# Patient Record
Sex: Female | Born: 1935
Health system: Southern US, Community
[De-identification: ages and names within clinical notes are randomized; demographics above are authoritative.]

## PROBLEM LIST (undated history)

## (undated) DIAGNOSIS — R32 Unspecified urinary incontinence: Secondary | ICD-10-CM

## (undated) DIAGNOSIS — E039 Hypothyroidism, unspecified: Secondary | ICD-10-CM

## (undated) DIAGNOSIS — K219 Gastro-esophageal reflux disease without esophagitis: Secondary | ICD-10-CM

## (undated) DIAGNOSIS — I639 Cerebral infarction, unspecified: Secondary | ICD-10-CM

## (undated) DIAGNOSIS — R296 Repeated falls: Secondary | ICD-10-CM

## (undated) DIAGNOSIS — E7800 Pure hypercholesterolemia, unspecified: Secondary | ICD-10-CM

## (undated) DIAGNOSIS — F32A Depression, unspecified: Secondary | ICD-10-CM

## (undated) DIAGNOSIS — F329 Major depressive disorder, single episode, unspecified: Secondary | ICD-10-CM

## (undated) HISTORY — PX: CATARACT EXTRACTION: SUR2

## (undated) HISTORY — DX: Cerebral infarction, unspecified: I63.9

## (undated) HISTORY — PX: CARPAL TUNNEL RELEASE: SHX101

## (undated) HISTORY — DX: Hypothyroidism, unspecified: E03.9

---

## 1999-11-30 ENCOUNTER — Other Ambulatory Visit: Admission: RE | Admit: 1999-11-30 | Discharge: 1999-11-30 | Payer: Self-pay | Admitting: Obstetrics and Gynecology

## 2000-03-24 ENCOUNTER — Encounter: Admission: RE | Admit: 2000-03-24 | Discharge: 2000-03-24 | Payer: Self-pay | Admitting: Obstetrics and Gynecology

## 2000-03-24 ENCOUNTER — Encounter: Payer: Self-pay | Admitting: Obstetrics and Gynecology

## 2002-04-21 ENCOUNTER — Other Ambulatory Visit: Admission: RE | Admit: 2002-04-21 | Discharge: 2002-04-21 | Payer: Self-pay | Admitting: Obstetrics and Gynecology

## 2003-03-03 ENCOUNTER — Other Ambulatory Visit: Admission: RE | Admit: 2003-03-03 | Discharge: 2003-03-03 | Payer: Self-pay | Admitting: Internal Medicine

## 2004-10-10 ENCOUNTER — Ambulatory Visit (HOSPITAL_COMMUNITY): Admission: RE | Admit: 2004-10-10 | Discharge: 2004-10-10 | Payer: Self-pay | Admitting: *Deleted

## 2005-11-08 ENCOUNTER — Other Ambulatory Visit: Admission: RE | Admit: 2005-11-08 | Discharge: 2005-11-08 | Payer: Self-pay | Admitting: Internal Medicine

## 2005-12-09 ENCOUNTER — Ambulatory Visit: Admission: RE | Admit: 2005-12-09 | Discharge: 2005-12-09 | Payer: Self-pay | Admitting: Internal Medicine

## 2007-05-01 ENCOUNTER — Other Ambulatory Visit: Admission: RE | Admit: 2007-05-01 | Discharge: 2007-05-01 | Payer: Self-pay | Admitting: Internal Medicine

## 2008-07-29 ENCOUNTER — Other Ambulatory Visit: Admission: RE | Admit: 2008-07-29 | Discharge: 2008-07-29 | Payer: Self-pay | Admitting: Internal Medicine

## 2009-03-19 ENCOUNTER — Inpatient Hospital Stay (HOSPITAL_COMMUNITY): Admission: EM | Admit: 2009-03-19 | Discharge: 2009-03-23 | Payer: Self-pay | Admitting: Emergency Medicine

## 2009-09-04 ENCOUNTER — Other Ambulatory Visit: Admission: RE | Admit: 2009-09-04 | Discharge: 2009-09-04 | Payer: Self-pay | Admitting: Internal Medicine

## 2011-01-14 LAB — CLOSTRIDIUM DIFFICILE EIA
C difficile Toxins A+B, EIA: NEGATIVE
C difficile Toxins A+B, EIA: NEGATIVE

## 2011-01-14 LAB — FECAL LACTOFERRIN, QUANT: Fecal Lactoferrin: POSITIVE

## 2011-01-14 LAB — UIFE/LIGHT CHAINS/TP QN, 24-HR UR
Albumin, U: DETECTED
Alpha 1, Urine: DETECTED — AB
Alpha 2, Urine: DETECTED — AB
Beta, Urine: DETECTED — AB
Free Kappa Lt Chains,Ur: 50.9 mg/dL — ABNORMAL HIGH (ref 0.04–1.51)
Free Kappa/Lambda Ratio: 12.06 ratio — ABNORMAL HIGH (ref 0.46–4.00)
Free Lambda Excretion/Day: 30.6 mg/d
Free Lambda Lt Chains,Ur: 4.22 mg/dL — ABNORMAL HIGH (ref 0.08–1.01)
Free Lt Chn Excr Rate: 369.03 mg/d
Gamma Globulin, Urine: DETECTED — AB
Time: 24 hours
Total Protein, Urine-Ur/day: 408 mg/d — ABNORMAL HIGH (ref 10–140)
Total Protein, Urine: 56.3 mg/dL
Volume, Urine: 725 mL

## 2011-01-14 LAB — COMPREHENSIVE METABOLIC PANEL
ALT: 11 U/L (ref 0–35)
ALT: 12 U/L (ref 0–35)
AST: 13 U/L (ref 0–37)
AST: 17 U/L (ref 0–37)
Albumin: 2 g/dL — ABNORMAL LOW (ref 3.5–5.2)
Albumin: 2.2 g/dL — ABNORMAL LOW (ref 3.5–5.2)
Alkaline Phosphatase: 92 U/L (ref 39–117)
Alkaline Phosphatase: 93 U/L (ref 39–117)
BUN: 13 mg/dL (ref 6–23)
BUN: 14 mg/dL (ref 6–23)
CO2: 23 mEq/L (ref 19–32)
CO2: 25 mEq/L (ref 19–32)
Calcium: 8.3 mg/dL — ABNORMAL LOW (ref 8.4–10.5)
Calcium: 8.6 mg/dL (ref 8.4–10.5)
Chloride: 106 mEq/L (ref 96–112)
Chloride: 108 mEq/L (ref 96–112)
Creatinine, Ser: 0.78 mg/dL (ref 0.4–1.2)
Creatinine, Ser: 0.84 mg/dL (ref 0.4–1.2)
GFR calc Af Amer: >60 mL/min (ref >60)
GFR calc Af Amer: >60 mL/min (ref >60)
GFR calc non Af Amer: >60 mL/min (ref >60)
GFR calc non Af Amer: >60 mL/min (ref >60)
Glucose, Bld: 110 mg/dL — ABNORMAL HIGH (ref 70–99)
Glucose, Bld: 122 mg/dL — ABNORMAL HIGH (ref 70–99)
Potassium: 3.7 mEq/L (ref 3.5–5.1)
Potassium: 4.2 mEq/L (ref 3.5–5.1)
Sodium: 137 mEq/L (ref 135–145)
Sodium: 138 mEq/L (ref 135–145)
Total Bilirubin: 0.5 mg/dL (ref 0.3–1.2)
Total Bilirubin: 0.7 mg/dL (ref 0.3–1.2)
Total Protein: 5.6 g/dL — ABNORMAL LOW (ref 6.0–8.3)
Total Protein: 5.8 g/dL — ABNORMAL LOW (ref 6.0–8.3)

## 2011-01-14 LAB — CBC
HCT: 28.2 % — ABNORMAL LOW (ref 36.0–46.0)
HCT: 31.8 % — ABNORMAL LOW (ref 36.0–46.0)
HCT: 29.4 % — ABNORMAL LOW (ref 36.0–46.0)
HCT: 29.5 % — ABNORMAL LOW (ref 36.0–46.0)
HCT: 31.8 % — ABNORMAL LOW (ref 36.0–46.0)
Hemoglobin: 10.6 g/dL — ABNORMAL LOW (ref 12.0–15.0)
Hemoglobin: 9.5 g/dL — ABNORMAL LOW (ref 12.0–15.0)
Hemoglobin: 10.5 g/dL — ABNORMAL LOW (ref 12.0–15.0)
Hemoglobin: 9.7 g/dL — ABNORMAL LOW (ref 12.0–15.0)
Hemoglobin: 9.8 g/dL — ABNORMAL LOW (ref 12.0–15.0)
MCHC: 33.3 g/dL (ref 30.0–36.0)
MCHC: 33.9 g/dL (ref 30.0–36.0)
MCHC: 32.9 g/dL (ref 30.0–36.0)
MCHC: 33 g/dL (ref 30.0–36.0)
MCHC: 33.4 g/dL (ref 30.0–36.0)
MCV: 89.3 fL (ref 78.0–100.0)
MCV: 89.7 fL (ref 78.0–100.0)
MCV: 90.6 fL (ref 78.0–100.0)
MCV: 90.7 fL (ref 78.0–100.0)
MCV: 91 fL (ref 78.0–100.0)
Platelets: 425 10*3/uL — ABNORMAL HIGH (ref 150–400)
Platelets: 477 10*3/uL — ABNORMAL HIGH (ref 150–400)
Platelets: 356 10*3/uL (ref 150–400)
Platelets: 376 10*3/uL (ref 150–400)
Platelets: 395 10*3/uL (ref 150–400)
RBC: 3.15 MIL/uL — ABNORMAL LOW (ref 3.87–5.11)
RBC: 3.55 MIL/uL — ABNORMAL LOW (ref 3.87–5.11)
RBC: 3.24 MIL/uL — ABNORMAL LOW (ref 3.87–5.11)
RBC: 3.24 MIL/uL — ABNORMAL LOW (ref 3.87–5.11)
RBC: 3.51 MIL/uL — ABNORMAL LOW (ref 3.87–5.11)
RDW: 15.4 % (ref 11.5–15.5)
RDW: 15.8 % — ABNORMAL HIGH (ref 11.5–15.5)
RDW: 13.9 % (ref 11.5–15.5)
RDW: 14.4 % (ref 11.5–15.5)
RDW: 15.1 % (ref 11.5–15.5)
WBC: 9.6 10*3/uL (ref 4.0–10.5)
WBC: 9.9 10*3/uL (ref 4.0–10.5)
WBC: 13.9 10*3/uL — ABNORMAL HIGH (ref 4.0–10.5)
WBC: 15.1 10*3/uL — ABNORMAL HIGH (ref 4.0–10.5)
WBC: 17.6 10*3/uL — ABNORMAL HIGH (ref 4.0–10.5)

## 2011-01-14 LAB — BASIC METABOLIC PANEL
BUN: 4 mg/dL — ABNORMAL LOW (ref 6–23)
CO2: 25 mEq/L (ref 19–32)
Calcium: 7.9 mg/dL — ABNORMAL LOW (ref 8.4–10.5)
Chloride: 107 mEq/L (ref 96–112)
Creatinine, Ser: 0.69 mg/dL (ref 0.4–1.2)
GFR calc Af Amer: >60 mL/min (ref >60)
GFR calc non Af Amer: >60 mL/min (ref >60)
Glucose, Bld: 125 mg/dL — ABNORMAL HIGH (ref 70–99)
Potassium: 3 mEq/L — ABNORMAL LOW (ref 3.5–5.1)
Sodium: 136 mEq/L (ref 135–145)

## 2011-01-14 LAB — URINALYSIS, ROUTINE W REFLEX MICROSCOPIC
Bilirubin Urine: NEGATIVE
Glucose, UA: NEGATIVE mg/dL
Hgb urine dipstick: NEGATIVE
Ketones, ur: 15 mg/dL — AB
Nitrite: POSITIVE — AB
Protein, ur: 30 mg/dL — AB
Specific Gravity, Urine: 1.042 — ABNORMAL HIGH (ref 1.005–1.030)
Urobilinogen, UA: 1 mg/dL (ref 0.0–1.0)
pH: 5.5 (ref 5.0–8.0)

## 2011-01-14 LAB — VITAMIN B12: Vitamin B-12: 1371 pg/mL — ABNORMAL HIGH (ref 211–911)

## 2011-01-14 LAB — 5 HIAA, QUANTITATIVE, URINE, 24 HOUR
5-HIAA, 24 Hr Urine: 1 mg/24 h (ref <=6.0)
Volume, Urine-5HIAA: 725 mL/24 h

## 2011-01-14 LAB — PROTEIN ELECTROPH W RFLX QUANT IMMUNOGLOBULINS
Albumin ELP: 40.6 % — ABNORMAL LOW (ref 55.8–66.1)
Alpha-1-Globulin: 11.7 % — ABNORMAL HIGH (ref 2.9–4.9)
Alpha-2-Globulin: 15.2 % — ABNORMAL HIGH (ref 7.1–11.8)
Beta 2: 9.4 % — ABNORMAL HIGH (ref 3.2–6.5)
Beta Globulin: 5.9 % (ref 4.7–7.2)
Gamma Globulin: 17.2 % (ref 11.1–18.8)
M-Spike, %: NOT DETECTED g/dL
Total Protein ELP: 5.4 g/dL — ABNORMAL LOW (ref 6.0–8.3)

## 2011-01-14 LAB — FOLATE RBC: RBC Folate: 1167 ng/mL — ABNORMAL HIGH (ref 180–600)

## 2011-01-14 LAB — DIFFERENTIAL
Basophils Absolute: 0.2 10*3/uL — ABNORMAL HIGH (ref 0.0–0.1)
Basophils Relative: 1 % (ref 0–1)
Eosinophils Absolute: 0 10*3/uL (ref 0.0–0.7)
Eosinophils Relative: 0 % (ref 0–5)
Lymphocytes Relative: 5 % — ABNORMAL LOW (ref 12–46)
Lymphs Abs: 0.9 10*3/uL (ref 0.7–4.0)
Monocytes Absolute: 0.9 10*3/uL (ref 0.1–1.0)
Monocytes Relative: 5 % (ref 3–12)
Neutro Abs: 15.6 10*3/uL — ABNORMAL HIGH (ref 1.7–7.7)
Neutrophils Relative %: 89 % — ABNORMAL HIGH (ref 43–77)
WBC Morphology: INCREASED

## 2011-01-14 LAB — OVA AND PARASITE EXAMINATION

## 2011-01-14 LAB — URINE MICROSCOPIC-ADD ON

## 2011-01-14 LAB — IGG, IGA, IGM
IgA: 363 mg/dL (ref 68–378)
IgG (Immunoglobin G), Serum: 998 mg/dL (ref 694–1618)
IgM, Serum: 97 mg/dL (ref 60–263)

## 2011-01-14 LAB — IRON AND TIBC
Iron: <10 ug/dL — ABNORMAL LOW (ref 42–135)
UIBC: 92 ug/dL

## 2011-01-14 LAB — TSH: TSH: 0.447 u[IU]/mL (ref 0.350–4.500)

## 2011-01-14 LAB — STOOL CULTURE

## 2011-01-14 LAB — IMMUNOFIXATION ADD-ON

## 2011-01-14 LAB — LACTATE DEHYDROGENASE: LDH: 70 U/L — ABNORMAL LOW (ref 94–250)

## 2011-01-14 LAB — FERRITIN: Ferritin: 419 ng/mL — ABNORMAL HIGH (ref 10–291)

## 2011-01-14 LAB — T4, FREE: Free T4: 0.96 ng/dL (ref 0.80–1.80)

## 2011-01-14 LAB — HAPTOGLOBIN: Haptoglobin: 272 mg/dL — ABNORMAL HIGH (ref 16–200)

## 2011-01-14 LAB — GASTRIN: Gastrin: 74 pg/mL (ref 13–115)

## 2011-02-19 NOTE — Discharge Summary (Signed)
Patricia [name], Patricia [name]                ACCOUNT NO.:  [id number]   MEDICAL RECORD NO.:  [id number]          PATIENT TYPE:  INP   LOCATION:  1539                         FACILITY:  [facility]   PHYSICIAN:  Isidor Holts, M.D.  DATE OF BIRTH:  23-Oct-1929   DATE OF ADMISSION:  03/19/2009  DATE OF DISCHARGE:  03/23/2009                               DISCHARGE SUMMARY   PRIMARY CARE PHYSICIAN:  Soyla Murphy. Renne Crigler, M.D.   PRIMARY GASTROENTEROLOGIST:  Georgiana Spinner, M.D.   DISCHARGE DIAGNOSES:  1. Clostridium difficile enterocolitis.  2. Gastroesophageal reflux disease.  3. History of diverticulosis.  4. Iron deficiency anemia.  5. Dysthyroidism.  6. History of TMJ (temporomandibular joint) arthritis.   DISCHARGE MEDICATIONS:  1. Synthroid 88 mcg p.o. daily.  2. Elavil 10 mg p.o. q.h.s.  3. Flagyl 500 mg p.o. t.i.d. with food for 14 days.  4. K-Dur 20 mEq p.o. daily for 3 days only.   PROCEDURES:  Chest x-ray done March 19, 2009, this showed no active  disease.   CONSULTATIONS:  Georgiana Spinner, M.D., gastroenterologist.   ADMISSION HISTORY:  As in H and P notes of March 19, 2009, dictated by  Dr. Massie Maroon.  However, in brief this is a 77 year old female, with  known history of GERD, sigmoid diverticulosis confirmed colonoscopically  on October 10, 2004, internal hemorrhoids, TMJ arthritis, presenting with  watery diarrhea associated with lower abdominal cramping.  Reportedly  her symptoms started approximately 3 weeks ago, and were not associated  with nausea, vomiting, constipation or passage of blood per rectum.  She  had been scheduled to have a colonoscopy on June 14. 2010, by Dr. Sabino Gasser, gastroenterologist.  However, because of escalating symptoms, she  presented to the emergency department, where she was found to have a  white cell count of 17,600, hemoglobin 10.5.  She was therefore admitted  for further evaluation, investigation and management.   CLINICAL COURSE:  1. C.  difficile enterocolitis. For details of presentation, refer to      admission history above.  On detailed questioning, it appears that      the patient has in the recent past, been treated with a course of      antibiotics, i.e. amoxicillin.  Following admission, she was      commenced on bowel rest, intravenous fluid hydration, and was      started empirically on a combination of Ciprofloxacin and Flagyl.      Stool samples sent for C. difficile toxin, came back positive.  She      was therefore, transitioned to monotherapy with Flagyl and by March 23, 2009, the diarrhea had completely resolved and the patient was      on day #5 of Flagyl therapy.  She has been seen by Dr. Sabino Gasser,      gastroenterologist, who has opined that colonoscopy was not      indicated at the present time, and has recommended a further 2      weeks of Flagyl therapy, following discharge.  The patient will  follow up with him in his office, in due course.  We were pleased      to note that as of March 23, 2009, white cell count had normalized      to 9,900.  The patient had no febrile episodes during the course of      this hospitalization.   1. GERD.  The patient remained asymptomatic from this viewpoint.   1. Iron deficiency anemia.  As noted above, the patient at the time of      presentation, had a hemoglobin of 10.5, MCV was normocytic at 90.6.      Hematinic studies were done, and showed the following findings:      Iron less than 10, total iron binding capacity and percent      desaturation not calculated, vitamin B12 was normal at 1371, RBC      folate was normal at 1167, ferritin was elevated at 419.  It was      felt that this may be due to acute phase reaction.  She did undergo      a myeloma screen, which showed no evidence of an M spike, however,      urinary free kappa light chains were noted.  The clinical      significance of this finding is uncertain.  Likely, the patient's      iron  deficiency anemia is secondary to known history of      diverticulosis.  She has informed us that she is due for routine      screening colonoscopy sometime in 2011.  We shall defer the timing      of this, to the patient's primary gastroenterologist, Dr. Sabino Gasser.  Meanwhile, a normocytic anemia which is likely dimorphic      against a background of positive urine light chains and absence of      M spike on SPEP, raises the specter of possible monoclonal      gammopathy of uncertain significance.  We shall defer further      evaluation of this, to the patient's primary MD, Dr. Merri Brunette.   1. Dysthyroidism.  The patient has a known history of hypothyroidism.      TSH during this hospitalization, was normal at 0.447.  She has been      continued on pre-admission dosage of Synthroid.   1. TMJ arthritis.  This appears to be a recent diagnosis and the      patient has indeed as a matter of fact, already been evaluated by a      Designer, industrial/product.  We shall defer management to him.   DISPOSITION:  The patient did experience a mild conjuntivitis during the  initial few days of hospitalization, which responded readily to  Gentamicin eyedrops, and was on March 23, 2009, asymptomatic and appeared  clinically stable for discharge.  She did vomit once following breakfast  in the a.m. of March 23, 2009, but thereafter, remained asymptomatic.   DIET:  Regular/pureed consistency, in view of TMJ arthritis.   ACTIVITY:  As tolerated.   FOLLOWUP INSTRUCTIONS:  The patient is to follow up with her primary MD,  Dr. Merri Brunette, per prior scheduled appointment.  Also, she is to  follow up with her primary gastroenterologist, Dr. Sabino Gasser, in  approximately 2 weeks.  She has been instructed to call for an  appointment.   SPECIAL INSTRUCTIONS:  Amoxicillin has been discontinued.      Cristal Deer  Brien Few, M.D.  Electronically Signed     CO/MEDQ  D:  03/23/2009  T:  03/23/2009  Job:   [id number]   cc:   Soyla Murphy. Renne Crigler, M.D.  Fax: [phone]   Georgiana Spinner, M.D.  Fax: [phone]

## 2011-02-19 NOTE — H&P (Signed)
Patricia [name], Patricia [name] NO.:  [id number]   MEDICAL RECORD NO.:  [id number]          PATIENT TYPE:  EMS   LOCATION:  ED                           FACILITY:  [facility]   PHYSICIAN:  Massie Maroon, MD        DATE OF BIRTH:  September 07, 1930   DATE OF ADMISSION:  03/19/2009  DATE OF DISCHARGE:                              HISTORY & PHYSICAL   CHIEF COMPLAINT:  Diarrhea.   HISTORY OF PRESENT ILLNESS:  A 77 year old female with complaints of  chronic diarrhea.  She noted increasing fatigue as well as increased  diarrhea, which she described as watery and so came to the ER.  She  notes that 3 weeks ago she had 3 days of lower abdominal cramping.  She  does not have any abdominal pain at this time.  The patient denies any  nausea, vomiting, constipation, bright red blood per rectum, or black  stool.  She noted that she was scheduled for a colonoscopy tomorrow and  this is for evaluation of diarrhea.  She is supposed to have this  colonoscopy with Dr. Sabino Gasser.  The patient was noted to be afebrile  and borderline tachycardic in the ER with a pulse of 97 as well as a  blood pressure of 105/58.  Her white count was noted to be elevated at  17.6, and she was noted to be anemic with a hemoglobin of 10.5.  Her  liver function was within normal limits.  Her renal function was also  within normal limits.  The patient will be admitted for workup of  diarrhea as well as some dysphasia, which she as described over the past  10 days.  She notes that the dysphagia is both to liquids and solids.  She notes that she has a history of GERD, but this is not increased.  The patient is not presently on any medications for acid reflux.   PAST MEDICAL HISTORY:  1. Diarrhea.  2. GERD.  3. Sigmoid diverticulosis present on colonoscopy, October 10, 2004,      which showed internal hemorrhoids as well.  There is no history of      anemia.   PAST SURGICAL HISTORY:  None.   SOCIAL HISTORY:  Patient  does not smoke or drink.   FAMILY HISTORY:  None.   REVIEW OF SYSTEMS:  Positive for neck pain x1 week as well as some jaw  soreness, which she states was said to be due to TMJ.  She was started  on Elavil for some grinding of her teeth at 10 mg p.o. q.h.s.  She also  notes just generalized muscle aches.  Otherwise, review of systems is  negative for all 10 organ systems except for pertinent positives stated  above.   ALLERGIES:  No known drug allergies.   MEDICATIONS:  Synthroid, amoxicillin, Elavil.   PHYSICAL EXAMINATION:  VITAL SIGNS:  Temperature 99, pulse 97, blood  pressure 105/58, pulse-ox 96% on room air.  HEENT:  Anicteric, EOMI, no nystagmus, pupils 1.5 mm, symmetric, direct,  consensual, near reflexes intact, mucous membranes moist.  NECK:  No JVD, no bruit, no thyromegaly, no adenopathy.  HEART:  Regular rate and rhythm, S1 S2, no murmurs, gallops, or rubs.  LUNGS:  Clear to auscultation bilaterally.  ABDOMEN:  Soft, nontender, and nondistended, positive bowel sounds.  EXTREMITIES:  No cyanosis, clubbing, or edema.  SKIN:  No rashes.  LYMPH NODES.  No adenopathy.  NEURO EXAM:  Cranial nerves II-XII intact, reflexes 2+, symmetric,  diffuse with downgoing toes bilaterally, motor strength 5/5 in all 4  extremities, pinprick intact.   LABORATORY DATA:  WBC 17.6 (high), hemoglobin 10.5, platelet count 376.   Sodium 137, potassium 4.2, chloride 106, bicarb 25, BUN 14, creatinine  0.84, glucose 122.  AST 17, ALT 12, albumin 2.2, total protein 5.8  (low).  Urinalysis was negative except for an elevated specific gravity  of 1.042.   ASSESSMENT:  1. Diarrhea.  2. Dysphagia.  3. Anemia.  4. Hypothyroidism.   PLAN:  1. Diarrhea possibly secondary to her Synthroid being too strong a      dose:  We will check a TSH.  Diarrhea could also be possibly      secondary to sigmoid diverticulitis versus diverticulosis.  We will      also check a urine for 5-HIAA as well as a  gastrin level.  We will      check stool for fecal leukocytes, stool cultures, C. diff, ova and      parasites.  We will consult GI, Dr. Virginia Rochester, for an EGD plus      colonoscopy.  We will treat the diarrhea with Flagyl 500 mg IV q.8      while we are waiting for results.  2. Dysphagia:  The patient will be started on Protonix 40 mg IV b.i.d.      and we will have further evaluation by Dr. Sabino Gasser tomorrow.  We      will make the patient NPO except for some ice chips.  3. Anemia:  We will check iron studies, TIBC, ferritin, B12, folic      acid, TSH, LDH, haptoglobin, as well as an SPEP and a UPEP.  4. DVT prophylaxis:  We will use SCDs and TEDS.      Massie Maroon, MD  Electronically Signed     JYK/MEDQ  D:  03/19/2009  T:  03/19/2009  Job:  [id number]   cc:   Soyla Murphy. Renne Crigler, M.D.  Fax: [phone]   Georgiana Spinner, M.D.  Fax: [phone]   Vangie Bicker, P.A.

## 2011-02-22 NOTE — Op Note (Signed)
Patricia [name], Patricia [name]                ACCOUNT NO.:  [id number]   MEDICAL RECORD NO.:  [id number]          PATIENT TYPE:  AMB   LOCATION:  ENDO                         FACILITY:  [facility]   PHYSICIAN:  Georgiana Spinner, M.D.    DATE OF BIRTH:  09-15-1930   DATE OF PROCEDURE:  10/10/2004  DATE OF DISCHARGE:                                 OPERATIVE REPORT   PROCEDURE:  Colonoscopy.   INDICATIONS:  Colon cancer screening.   ANESTHESIA:  Demerol 70, Versed 7 mg.   PROCEDURE:  With the patient mildly sedated in the left lateral decubitus  position, the Olympus videoscopic colonoscope was inserted in the rectum,  passed under direct vision to the cecum identified by ileocecal valve and  base of cecum, both which were photographed. From this point the colonoscope  was slowly withdrawn, taking circumferential views of colonic mucosa,  stopping in the rectum which appeared normal on direct, showed hemorrhoids  on retroflexed view. The endoscope was straightened and withdrawn. The  patient's vital signs and pulse oximeter remained stable. The patient  tolerated procedure well without apparent complications.   FINDINGS:  Internal hemorrhoids, some diverticula of the sigmoid colon,  diffuse melanosis coli.   PLAN:  Advised decreasing the use of laxatives and follow-up will be with me  as needed.      GMO/MEDQ  D:  10/10/2004  T:  10/10/2004  Job:  [id number]

## 2015-01-16 ENCOUNTER — Other Ambulatory Visit: Payer: Self-pay | Admitting: Internal Medicine

## 2015-01-16 DIAGNOSIS — G459 Transient cerebral ischemic attack, unspecified: Secondary | ICD-10-CM

## 2015-01-23 ENCOUNTER — Ambulatory Visit
Admission: RE | Admit: 2015-01-23 | Discharge: 2015-01-23 | Disposition: A | Discharge status: Home / Self Care | Payer: Medicare Other | Source: Ambulatory Visit | Attending: Internal Medicine | Admitting: Internal Medicine

## 2015-01-23 DIAGNOSIS — G459 Transient cerebral ischemic attack, unspecified: Secondary | ICD-10-CM

## 2015-01-26 ENCOUNTER — Encounter: Payer: Self-pay | Admitting: Neurology

## 2015-01-26 ENCOUNTER — Ambulatory Visit (INDEPENDENT_AMBULATORY_CARE_PROVIDER_SITE_OTHER): Payer: Medicare Other | Admitting: Neurology

## 2015-01-26 VITALS — BP 126/71 | HR 87 | Temp 97.0°F | Ht 67.0 in | Wt 156.0 lb

## 2015-01-26 DIAGNOSIS — R410 Disorientation, unspecified: Secondary | ICD-10-CM

## 2015-01-26 DIAGNOSIS — G9340 Encephalopathy, unspecified: Secondary | ICD-10-CM | POA: Diagnosis not present

## 2015-01-26 DIAGNOSIS — H53139 Sudden visual loss, unspecified eye: Secondary | ICD-10-CM | POA: Diagnosis not present

## 2015-01-26 DIAGNOSIS — I639 Cerebral infarction, unspecified: Secondary | ICD-10-CM

## 2015-01-26 DIAGNOSIS — R4182 Altered mental status, unspecified: Secondary | ICD-10-CM | POA: Insufficient documentation

## 2015-01-26 DIAGNOSIS — H53131 Sudden visual loss, right eye: Secondary | ICD-10-CM

## 2015-01-26 NOTE — Progress Notes (Signed)
[facility]    Provider:  Dr Jaynee Eagles Referring Provider: Deland Pretty, MD Primary Care Physician:  Horatio Pel, MD  CC:  One episode of not remembering  HPI:  Patricia [name] is a 81 y.o. female here as a referral from Dr. Shelia Media for episode of altered awareness. PMHx hypothyroidism, mild tremor.   She was at home on March 25th, had a headache on the left frontal area, was tring to change the channel, she couldn't get it to change. She felt confused, doesn't have an app to the change the channel. The controls were right in front of her. She lost consciousness from then until midnight. She regained consciousness in the same place, shocked it was midnight. Tried to use cell phone again, still confused at that time. Didn't know how to get to her bedroom, couldn't get the TV off. She turned the power button off and went to bed. Vision is blurry since the episode, right prisms, muted colors for several days afterwards. No fhx seizure, or migraines.  No recents infections but did hit her ankle on March 4th a hitch which was swollen. There was swelling and bruising but no cellulitis. Here with daughter who has never noticed anything unusual.  No urinary symptoms. No other episodes of staring, strange behavior, altered awareness, loss of urine, biting of tongue, finding herself on floor or in strange places, getting lost in the car. Doesn't believe she fell asleep. She has a lot of stress, her husband had a stroke. She is main caretaker.   Review of Systems: Patient complains of symptoms per HPI as well as the following symptoms: No CP, no SOB. Pertinent negatives per HPI. All others negative.   History   Social History  . Marital Status: Married    Spouse Name: [name]   . Number of Children: 4  . Years of Education: 16+   Occupational History  . Not on file.   Social History Main Topics  . Smoking status: Never Smoker   . Smokeless tobacco: Not on file  . Alcohol Use:  0.0 oz/week    0 Standard drinks or equivalent per week     Comment: Very rarley   . Drug Use: No  . Sexual Activity: Not on file   Other Topics Concern  . 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 Heart disease Mother   . Lupus Daughter   . Anuerysm Daughter     Brain    History reviewed. No pertinent past medical history.  Past Surgical History  Procedure Laterality Date  . Cataract extraction Bilateral     Current Outpatient Prescriptions  Medication Sig Dispense Refill  . aspirin 81 MG chewable tablet Chew 81 mg by mouth daily.    . Biotin 5000 MCG TABS Take 1 tablet by mouth daily.    Marland Kitchen CALCIUM PO Take 1,000 mg by mouth daily.    . Cholecalciferol (VITAMIN D-3 PO) Take 1,000 mg by mouth daily.    . DiphenhydrAMINE HCl (BENADRYL PO) Take 1 tablet by mouth every evening.    . Flaxseed, Linseed, (FLAX SEED OIL PO) Take 1,000 mg by mouth daily.    Marland Kitchen GLUCOSAMINE-CHONDROITIN PO Take 1,500 mg by mouth daily.    Marland Kitchen levothyroxine (SYNTHROID, LEVOTHROID) 88 MCG tablet Take 88 mcg  by mouth daily before breakfast.    . MELATONIN PO Take 5 mg by mouth daily.    . Misc Natural Products (COLON CLEANSE PO) Take 450 mg by mouth daily.    . Multiple Vitamin (MULTIVITAMIN) tablet Take 1 tablet by mouth daily.    . Omega-3 Fatty Acids (FISH OIL PO) Take 1,200 mg by mouth daily.    Marland Kitchen POTASSIUM PO Take 550 mg by mouth daily.    . Probiotic Product (PROBIOTIC DAILY PO) Take 1 tablet by mouth daily.    . TURMERIC PO Take 750 mg by mouth daily.    . vitamin B-12 (CYANOCOBALAMIN) 1000 MCG tablet Take 1,000 mcg by mouth daily.    . vitamin C (ASCORBIC ACID) 500 MG tablet Take 500 mg by mouth daily.     No current facility-administered medications for this visit.    Allergies as of 01/26/2015  . (No Known Allergies)    Vitals: BP 126/71  mmHg  Pulse 87  Temp(Src) 97 F (36.1 C)  Ht 5\' 7"  (1.702 m)  Wt 156 lb (70.761 kg)  BMI 24.43 kg/m2 Last Weight:  Wt Readings from Last 1 Encounters:  01/26/15 156 lb (70.761 kg)   Last Height:   Ht Readings from Last 1 Encounters:  01/26/15 5\' 7"  (1.702 m)    Physical exam: Exam: Gen: NAD, conversant, well nourised, well groomed                     CV: RRR, no MRG. No Carotid Bruits. No peripheral edema, warm, nontender Eyes: Conjunctivae clear without exudates or hemorrhage  Neuro: Detailed Neurologic Exam  Speech:    Speech is normal; fluent and spontaneous with normal comprehension.  Cognition:    The patient is oriented to person, place, and time;     recent and remote memory intact;     language fluent;     normal attention, concentration,     fund of knowledge Cranial Nerves:    The pupils are equal, round, and reactive to light. The fundi are normal and spontaneous venous pulsations are present. Visual fields are full to finger confrontation. Extraocular movements are intact. Trigeminal sensation is intact and the muscles of mastication are normal. The face is symmetric. The palate elevates in the midline. Hearing intact. Voice is normal. Shoulder shrug is normal. The tongue has normal motion without fasciculations.   Coordination:    Normal finger to nose and heel to shin. Normal rapid alternating movements.   Gait:    Heel-toe and tandem gait are normal.   Motor Observation:  mild postural tremor Tone:    Normal muscle tone.    Posture:    Posture is normal. normal erect    Strength:    Weakness proximal uppers (shoulder pain) Weakness proximal hip flexors.      Sensation: intact to LT     Reflex Exam:  DTR's:    Absent achilles Toes:    The toes are downgoing bilaterally.   Clonus:    Clonus is absent.      Assessment/Plan:  81 year old female with one episode of altered awareness or possible loss of consciousness.  EEG labs MRI of  the brain w/wo contrast Follow up in 4 weeks  Sarina Ill, MD  [facility] [address] [place], [place] [address]  Phone [phone] Fax [phone]

## 2015-01-26 NOTE — Patient Instructions (Signed)
Overall you are doing fairly well but I do want to suggest a few things today:   Remember to drink plenty of fluid, eat healthy meals and do not skip any meals. Try to eat protein with a every meal and eat a healthy snack such as fruit or nuts in between meals. Try to keep a regular sleep-wake schedule and try to exercise daily, particularly in the form of walking, 20-30 minutes a day, if you can.   As far as your medications are concerned, I would like to suggest: continue current medications   As far as diagnostic testing: MRi of the brain, EEG  I would like to see you back after workup is complete, sooner if we need to. Please call us with any interim questions, concerns, problems, updates or refill requests.   Please also call us for any test results so we can go over those with you on the phone.  My clinical assistant and will answer any of your questions and relay your messages to me and also relay most of my messages to you.   Our phone number is [phone]. We also have an after hours call service for urgent matters and there is a physician on-call for urgent questions. For any emergencies you know to call 911 or go to the nearest emergency room

## 2015-01-27 ENCOUNTER — Telehealth: Payer: Self-pay | Admitting: *Deleted

## 2015-01-27 LAB — COMPREHENSIVE METABOLIC PANEL
ALT: 10 IU/L (ref 0–32)
AST: 14 IU/L (ref 0–40)
Albumin/Globulin Ratio: 1.5 (ref 1.1–2.5)
Albumin: 4.1 g/dL (ref 3.5–4.7)
Alkaline Phosphatase: 47 IU/L (ref 39–117)
BUN/Creatinine Ratio: 22 (ref 11–26)
BUN: 19 mg/dL (ref 8–27)
Bilirubin Total: 0.4 mg/dL (ref 0.0–1.2)
CO2: 25 mmol/L (ref 18–29)
Calcium: 9.8 mg/dL (ref 8.7–10.3)
Chloride: 100 mmol/L (ref 97–108)
Creatinine, Ser: 0.87 mg/dL (ref 0.57–1.00)
GFR calc Af Amer: 71 mL/min/{1.73_m2} (ref >59)
GFR calc non Af Amer: 62 mL/min/{1.73_m2} (ref >59)
Globulin, Total: 2.7 g/dL (ref 1.5–4.5)
Glucose: 114 mg/dL — ABNORMAL HIGH (ref 65–99)
Potassium: 4.3 mmol/L (ref 3.5–5.2)
Sodium: 141 mmol/L (ref 134–144)
Total Protein: 6.8 g/dL (ref 6.0–8.5)

## 2015-01-27 LAB — CBC
HCT: 40.4 % (ref 34.0–46.6)
Hemoglobin: 13.1 g/dL (ref 11.1–15.9)
MCH: 30.3 pg (ref 26.6–33.0)
MCHC: 32.4 g/dL (ref 31.5–35.7)
MCV: 93 fL (ref 79–97)
Platelets: 316 10*3/uL (ref 150–379)
RBC: 4.33 x10E6/uL (ref 3.77–5.28)
RDW: 13.4 % (ref 12.3–15.4)
WBC: 7 10*3/uL (ref 3.4–10.8)

## 2015-01-27 NOTE — Telephone Encounter (Signed)
Spoke with patient about normal lab results. Patient verbalized understanding.

## 2015-02-02 ENCOUNTER — Ambulatory Visit (INDEPENDENT_AMBULATORY_CARE_PROVIDER_SITE_OTHER): Payer: Medicare Other | Admitting: Neurology

## 2015-02-02 DIAGNOSIS — G9340 Encephalopathy, unspecified: Secondary | ICD-10-CM

## 2015-02-02 DIAGNOSIS — R410 Disorientation, unspecified: Secondary | ICD-10-CM

## 2015-02-02 NOTE — Procedures (Signed)
    History:  Patricia [name] is an 81 year old patient with a history of an episode of loss of consciousness that occurred on 12/30/2014. The patient was having a headache, and she blacked out for several hours while watching TV. She felt confused, unable to use the remote for the TV. The patient is being evaluated for possible seizures.  This is a routine EEG. No skull defects are noted. Medications include aspirin, vitamin D, Synthroid, multivitamins, calcium supplementation, vitamin B12, and vitamin C.   EEG classification: Normal awake and drowsy  Description of the recording: The background rhythms of this recording consists of a fairly well modulated medium amplitude alpha rhythm of 8 Hz that is reactive to eye opening and closure. As the record progresses, the patient appears to remain in the waking state throughout the recording. Photic stimulation was performed, resulting in a bilateral and symmetric photic driving response. Hyperventilation was also performed, resulting in a minimal buildup of the background rhythm activities without significant slowing seen. Toward the end of the recording, the patient enters the drowsy state with slight symmetric slowing seen. The patient never enters stage II sleep. At no time during the recording does there appear to be evidence of spike or spike wave discharges or evidence of focal slowing. EKG monitor shows no evidence of cardiac rhythm abnormalities, with exception of occasional premature ventricular contractions, with a heart rate of 72.  Impression: This is a normal EEG recording in the waking and drowsy state. No evidence of ictal or interictal discharges are seen.

## 2015-02-06 ENCOUNTER — Telehealth: Payer: Self-pay | Admitting: *Deleted

## 2015-02-06 NOTE — Telephone Encounter (Signed)
Talked with patient about normal EEG results. She also said she missed a call about her MRI. I told her to call back and they were trying to schedule her MRI. Pt verbalized understanding.

## 2015-02-13 ENCOUNTER — Telehealth: Payer: Self-pay | Admitting: Neurology

## 2015-02-13 ENCOUNTER — Ambulatory Visit
Admission: RE | Admit: 2015-02-13 | Discharge: 2015-02-13 | Disposition: A | Discharge status: Home / Self Care | Payer: Medicare Other | Source: Ambulatory Visit | Attending: Neurology | Admitting: Neurology

## 2015-02-13 ENCOUNTER — Other Ambulatory Visit: Payer: Self-pay | Admitting: Neurology

## 2015-02-13 ENCOUNTER — Other Ambulatory Visit: Payer: Medicare Other

## 2015-02-13 DIAGNOSIS — R410 Disorientation, unspecified: Secondary | ICD-10-CM

## 2015-02-13 DIAGNOSIS — G9340 Encephalopathy, unspecified: Secondary | ICD-10-CM

## 2015-02-13 DIAGNOSIS — I639 Cerebral infarction, unspecified: Secondary | ICD-10-CM

## 2015-02-13 DIAGNOSIS — H53131 Sudden visual loss, right eye: Secondary | ICD-10-CM | POA: Diagnosis not present

## 2015-02-13 DIAGNOSIS — I480 Paroxysmal atrial fibrillation: Secondary | ICD-10-CM

## 2015-02-13 MED ORDER — GADOBENATE DIMEGLUMINE 529 MG/ML IV SOLN
14.0000 mL | Freq: Once | INTRAVENOUS | Status: AC | PRN
  Administered 2015-02-13: 14 mL via INTRAVENOUS

## 2015-02-13 NOTE — Telephone Encounter (Signed)
Spoke to patient. She had an occipital stroke on the left. C/w aspirin 81mg  daily. Will complete stroke workup with lipid panel, echo and CTA of the head.

## 2015-02-14 NOTE — Telephone Encounter (Signed)
Patient called stating that she had spoken with her daughter and she would like to come in and discuss with DR. Jaynee Eagles her MRI results for patient's daughter to discuss her questions and concerns regarding The test Dr. Jaynee Eagles wants her to have done. If patient needs to be reached, please do so @ [phone]

## 2015-02-14 NOTE — Telephone Encounter (Signed)
Called patient and she stated she already set up appointment for next Monday 02/20/15 at 2:45 for check in at 2:30pm.

## 2015-02-15 HISTORY — PX: EYE SURGERY: SHX253

## 2015-02-20 ENCOUNTER — Telehealth: Payer: Self-pay | Admitting: *Deleted

## 2015-02-20 ENCOUNTER — Ambulatory Visit (INDEPENDENT_AMBULATORY_CARE_PROVIDER_SITE_OTHER): Payer: Medicare Other | Admitting: Neurology

## 2015-02-20 ENCOUNTER — Encounter: Payer: Self-pay | Admitting: Neurology

## 2015-02-20 VITALS — BP 134/77 | HR 58 | Ht 67.0 in | Wt 157.6 lb

## 2015-02-20 DIAGNOSIS — I639 Cerebral infarction, unspecified: Secondary | ICD-10-CM | POA: Diagnosis not present

## 2015-02-20 MED ORDER — ATORVASTATIN CALCIUM 10 MG PO TABS: 10.0000 mg | ORAL_TABLET | Freq: Every day | ORAL | Status: DC

## 2015-02-20 NOTE — Telephone Encounter (Signed)
Left message to have Teresa Coombs return my call. I gave [place] phone number: [phone].

## 2015-02-20 NOTE — Telephone Encounter (Signed)
Called and spoke with Teresa Coombs from [facility] radiology scheduling department. She told me to have patient call [phone] to schedule her ECHO with bubble study.

## 2015-02-20 NOTE — Patient Instructions (Signed)
Overall you are doing fairly well but I do want to suggest a few things today:   Remember to drink plenty of fluid, eat healthy meals and do not skip any meals. Try to eat protein with a every meal and eat a healthy snack such as fruit or nuts in between meals. Try to keep a regular sleep-wake schedule and try to exercise daily, particularly in the form of walking, 20-30 minutes a day, if you can.   As far as your medications are concerned, I would like to suggest: Lipitor 10mg  every night  As far as diagnostic testing: CTA head, Echocardiogran  I would like to see you back in 3 months, sooner if we need to. Please call us with any interim questions, concerns, problems, updates or refill requests.   Please also call us for any test results so we can go over those with you on the phone.  My clinical assistant and will answer any of your questions and relay your messages to me and also relay most of my messages to you.   Our phone number is [phone]. We also have an after hours call service for urgent matters and there is a physician on-call for urgent questions. For any emergencies you know to call 911 or go to the nearest emergency room

## 2015-02-21 DIAGNOSIS — I639 Cerebral infarction, unspecified: Secondary | ICD-10-CM | POA: Insufficient documentation

## 2015-02-21 NOTE — Progress Notes (Addendum)
[facility]    Provider:  Dr Jaynee Eagles Referring Provider: Deland Pretty, MD Primary Care Physician:  Horatio Pel, MD  CC:  stroke  HPI: Patricia [name] is a 81 y.o. female here as a follow up. PMHx hypothyroidism, mild tremor.   Interval update 02/20/2015: Patient returns today after findings on MRI of the brain indicate an subacute ischemic event in the occipital area. Explained to patient that this is likely the reason for her symptoms that she was seen in the office last month. The area affected by stroke can also affect vision, and patient still reports blurry vision and difficulty reading. She was accompanied by daughter today. Reviewed all images with this lovely patient and her very nice daughter, showed patient to stroke, discussed the differential and different causes for this. Patient's MRI also showed nonspecific white matter changes likely small vessel chronic ischemic changes. Discussed with daughter and patient that we should start Lipitor, she should continue taking an aspirin for stroke prevention. Also feel as though a complete stroke workup is indicated including imaging of the vessels in the head, echocardiogram with bubble study.  MRi of the brain: IMPRESSION: This is an abnormal MRI of the brain with and without contrast showed the following: 1. Small focus in the left occipital lobe likely representing a small sub-chronic stroke.  2. Age-appropriate minimal atrophy and mild small vessel ischemic change. Recently reviewed images and agree with this report. Patient also brings in labs which show unremarkable CBC, unremarkable CMP, LDL mildly elevated at 129 goal is less than 70, TSH within normal limits.  Initial visit 01/26/2015: She was at home on March 25th, had a headache on the left frontal area, was tring to change the channel, she couldn't get it to change. She felt confused, doesn't have an app to the change the channel. The controls were  right in front of her. She lost consciousness from then until midnight. She regained consciousness in the same place, shocked it was midnight. Tried to use cell phone again, still confused at that time. Didn't know how to get to her bedroom, couldn't get the TV off. She turned the power button off and went to bed. Vision is blurry since the episode, right prisms, muted colors for several days afterwards. No fhx seizure, or migraines. No recents infections but did hit her ankle on March 4th a hitch which was swollen. There was swelling and bruising but no cellulitis. Here with daughter who has never noticed anything unusual. No urinary symptoms. No other episodes of staring, strange behavior, altered awareness, loss of urine, biting of tongue, finding herself on floor or in strange places, getting lost in the car. Doesn't believe she fell asleep. She has a lot of stress, her husband had a stroke. She is main caretaker.    Review of Systems: Patient complains of symptoms per HPI as well as the following symptoms: Denies Chest pain and denies shortness of breath denies fever and systemic symptoms. Pertinent negatives per HPI. All others negative.   History   Social History  . Marital Status: Married    Spouse Name: [name]   . Number of Children: 4  . Years of Education: 16+   Occupational History  . Not on file.   Social History Main Topics  . Smoking status: Never Smoker   . Smokeless tobacco: Not on file  . Alcohol Use: 0.0 oz/week    0 Standard drinks or equivalent per week     Comment: Very rarley   .  Drug Use: No  . Sexual Activity: Not on file   Other Topics Concern  . 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 Heart disease Mother   . Lupus Daughter   . Anuerysm Daughter     Brain    Past Medical History  Diagnosis Date  .  Hypothyroid     Past Surgical History  Procedure Laterality Date  . Cataract extraction Bilateral   . Eye surgery Left 02/15/15     Laser surgery     Current Outpatient Prescriptions  Medication Sig Dispense Refill  . aspirin 81 MG chewable tablet Chew 81 mg by mouth daily.    . Biotin 5000 MCG TABS Take 1 tablet by mouth daily.    Marland Kitchen CALCIUM PO Take 1,000 mg by mouth daily.    . Cholecalciferol (VITAMIN D-3 PO) Take 1,000 mg by mouth daily.    . DiphenhydrAMINE HCl (BENADRYL PO) Take 1 tablet by mouth every evening.    . Flaxseed, Linseed, (FLAX SEED OIL PO) Take 1,000 mg by mouth daily.    Marland Kitchen GLUCOSAMINE-CHONDROITIN PO Take 1,500 mg by mouth daily.    Marland Kitchen levothyroxine (SYNTHROID, LEVOTHROID) 88 MCG tablet Take 88 mcg by mouth daily before breakfast.    . MELATONIN PO Take 5 mg by mouth daily.    . Misc Natural Products (COLON CLEANSE PO) Take 450 mg by mouth daily.    . Multiple Vitamin (MULTIVITAMIN) tablet Take 1 tablet by mouth daily.    . Omega-3 Fatty Acids (FISH OIL PO) Take 1,200 mg by mouth daily.    Marland Kitchen POTASSIUM PO Take 550 mg by mouth daily.    . Probiotic Product (PROBIOTIC DAILY PO) Take 1 tablet by mouth daily.    . sertraline (ZOLOFT) 50 MG tablet Take 25 mg by mouth daily.    . TURMERIC PO Take 750 mg by mouth daily.    . vitamin B-12 (CYANOCOBALAMIN) 1000 MCG tablet Take 1,000 mcg by mouth daily.    . vitamin C (ASCORBIC ACID) 500 MG tablet Take 500 mg by mouth daily.    Marland Kitchen atorvastatin (LIPITOR) 10 MG tablet Take 1 tablet (10 mg total) by mouth daily. 30 tablet 11   No current facility-administered medications for this visit.    Allergies as of 02/20/2015  . (No Known Allergies)    Vitals: BP 134/77 mmHg  Pulse 58  Ht 5\' 7"  (1.702 m)  Wt 157 lb 9.6 oz (71.487 kg)  BMI 24.68 kg/m2 Last Weight:  Wt Readings from Last 1 Encounters:  02/20/15 157 lb 9.6 oz (71.487 kg)   Last Height:   Ht Readings from Last 1 Encounters:  02/20/15 5\' 7"  (1.702 m)    Speech:  Speech is normal; fluent and spontaneous with normal comprehension.  Cognition:  The patient is oriented to person, place, and time;   recent and remote memory intact;   language fluent;   normal attention, concentration,   fund of knowledge Cranial Nerves:  The pupils are equal, round, and reactive to light. The fundi are normal and spontaneous venous pulsations are present. Visual fields are full to finger confrontation. Extraocular movements are intact. Trigeminal sensation is intact and the muscles of mastication are normal. The face is symmetric. The palate elevates in the midline. Hearing intact.  Voice is normal. Shoulder shrug is normal. The tongue has normal motion without fasciculations.       Assessment/Plan:  This is a lovely 81 year old female who is here with her daughter for follow-up. She presented in April after an episode of altered awareness and possible loss of consciousness with vision changes. MRI of the brain showed a subacute left occipital ischemic infarct.  Follow very closely for primary care for management of vascular risk factors Patient started taking aspirin daily after discussing with me, she should continue to take this for stroke prevention Started patient on Lipitor for LDL 129 goal less than 70 We'll complete stroke workup including imaging of the vessels in the head. Carotid Dopplers were already completed and unremarkable. We'll order an echocardiogram with bubble study.  Addendum: Labs collected 05/23/2015 showed LDL of 57, HDL of 81, triglycerides 50, cholesterol total 148. CBC unremarkable. LFTs unremarkable. TSH within normal limits. Sedimentation rate 12.  Sarina Ill, MD  [facility] [address] [place], [place] [address]  Phone [phone] Fax [phone]  A total of 40 minutes was spent face-to-face with this patient. Over half this time was spent on counseling patient on  the stroke diagnosis and different diagnostic and therapeutic options available.

## 2015-02-22 NOTE — Telephone Encounter (Signed)
Spoke with patient to let her know she has an appt on 03/01/15 at 11:30 am at [facility] outpt imaging and to be there at 11:15am. Gave her the address and phone number to contact if she needs to reschedule.

## 2015-02-22 NOTE — Telephone Encounter (Signed)
Spoke with Jenny Reichmann from [facility] and she stated to call [phone] for the [facility] on [address]. She said to call her back if we need to schedule with them, they would be happy to.

## 2015-02-22 NOTE — Telephone Encounter (Signed)
Spoke with Solmon Ice from [facility] and set up appt for patient to complete her echo with bubble on May 77AJ at 28:78MV. Felicia asked to have pt arrive at 11:15 am. Address is:  [place], [place]   I told her I will call patient to let her know. Solmon Ice said to have her call [phone] if this appt does not work and she needs to reschedule.

## 2015-02-23 ENCOUNTER — Telehealth: Payer: Self-pay | Admitting: *Deleted

## 2015-02-23 NOTE — Telephone Encounter (Signed)
Error

## 2015-02-24 NOTE — Telephone Encounter (Signed)
Pt called and requested to speak with Terrence Dupont RN regarding her upcoming appt. At [facility] outpt imaging. Please call and advise.

## 2015-02-27 ENCOUNTER — Telehealth: Payer: Self-pay | Admitting: *Deleted

## 2015-02-27 NOTE — Telephone Encounter (Signed)
Spoke with patient to let her know she has appt on 03/01/15 at [facility] outpt imaging and to be there at 11:15am. Also told her to call [facility] imaging back to schedule MRA. Gave her [phone]. I told her I would call her back to let her know if she should have CT completed as well. Pt verbalized understanding.

## 2015-02-27 NOTE — Telephone Encounter (Signed)
Called pt to clarify that Dr. Jaynee Eagles would only like her to get MRA of the head and not CT scan. Told her to call [facility] imaging to schedule this. Pt verbalized understanding.

## 2015-02-27 NOTE — Telephone Encounter (Signed)
Thank you. She doesn't need both. Getting the MRA of the head is fine.   Patricia [name], how can we delete the CTA please? I ordered that outside of a note so not sure how to cancel it. Thanks.

## 2015-03-01 ENCOUNTER — Encounter (HOSPITAL_COMMUNITY): Payer: Self-pay

## 2015-03-01 ENCOUNTER — Ambulatory Visit (HOSPITAL_COMMUNITY): Payer: Medicare Other | Attending: Internal Medicine

## 2015-03-01 ENCOUNTER — Other Ambulatory Visit: Payer: Self-pay

## 2015-03-01 DIAGNOSIS — I639 Cerebral infarction, unspecified: Secondary | ICD-10-CM | POA: Diagnosis not present

## 2015-03-01 NOTE — Progress Notes (Signed)
IV 0.9% Nacl 22 G (R) hand x 1 for bubble study, tolerated well. Oletta Lamas, Laurabelle Gorczyca A

## 2015-03-07 ENCOUNTER — Telehealth: Payer: Self-pay

## 2015-03-07 NOTE — Telephone Encounter (Signed)
patient was informed  that echo did not reveal any clot or any etiology for her stroke, good news. She has no questions at this time

## 2015-03-10 ENCOUNTER — Ambulatory Visit
Admission: RE | Admit: 2015-03-10 | Discharge: 2015-03-10 | Disposition: A | Discharge status: Home / Self Care | Payer: Medicare Other | Source: Ambulatory Visit | Attending: Neurology | Admitting: Neurology

## 2015-03-10 DIAGNOSIS — I639 Cerebral infarction, unspecified: Secondary | ICD-10-CM

## 2015-03-14 ENCOUNTER — Other Ambulatory Visit: Payer: Self-pay | Admitting: Physician Assistant

## 2015-03-14 DIAGNOSIS — R1032 Left lower quadrant pain: Secondary | ICD-10-CM

## 2015-03-14 DIAGNOSIS — K5790 Diverticulosis of intestine, part unspecified, without perforation or abscess without bleeding: Secondary | ICD-10-CM

## 2015-03-16 ENCOUNTER — Telehealth: Payer: Self-pay | Admitting: Neurology

## 2015-03-16 NOTE — Telephone Encounter (Signed)
Spoke with patient, atherosclerosis of the vessels likely the etiology of her occipital infarct. Needs aggressive management of risk factors including glucose, diabetes and HLD - follow with pcp. Continue ASA and statin for stroke prevention. Trials have shown that adding plavix to aspirin for 3 months post stroke may help prevent further infarcts but stroke was March 25th so we are already out of that window. Will continue ASA daily.    IMPRESSION: Abnormal MRA brain showing severe atheromatous changes in distal left posterior cerebral artery and moderate atheromatous narrowing of distal right posterior cerebral artery. Persistent fetal pattern of origin of right posterior cerebral and hypoplastic terminal right vertebral and A1 segment of right anterior cerebral arteries are benign birth variants.

## 2015-03-21 ENCOUNTER — Ambulatory Visit
Admission: RE | Admit: 2015-03-21 | Discharge: 2015-03-21 | Disposition: A | Discharge status: Home / Self Care | Payer: Medicare Other | Source: Ambulatory Visit | Attending: Physician Assistant | Admitting: Physician Assistant

## 2015-03-21 DIAGNOSIS — R1032 Left lower quadrant pain: Secondary | ICD-10-CM

## 2015-03-21 DIAGNOSIS — K5790 Diverticulosis of intestine, part unspecified, without perforation or abscess without bleeding: Secondary | ICD-10-CM

## 2015-03-21 MED ORDER — IOPAMIDOL (ISOVUE-300) INJECTION 61%
100.0000 mL | Freq: Once | INTRAVENOUS | Status: AC | PRN
  Administered 2015-03-21: 100 mL via INTRAVENOUS

## 2015-04-03 ENCOUNTER — Other Ambulatory Visit: Payer: Self-pay

## 2015-05-24 ENCOUNTER — Ambulatory Visit: Payer: Medicare Other | Admitting: Neurology

## 2015-05-30 ENCOUNTER — Encounter: Payer: Self-pay | Admitting: Neurology

## 2015-05-30 ENCOUNTER — Ambulatory Visit (INDEPENDENT_AMBULATORY_CARE_PROVIDER_SITE_OTHER): Payer: Medicare Other | Admitting: Neurology

## 2015-05-30 VITALS — BP 130/64 | HR 68 | Ht 67.0 in | Wt 156.5 lb

## 2015-05-30 DIAGNOSIS — I639 Cerebral infarction, unspecified: Secondary | ICD-10-CM | POA: Diagnosis not present

## 2015-05-30 NOTE — Patient Instructions (Signed)
Overall you are doing extremely well but I do want to suggest a few things today:   Remember to drink plenty of fluid, eat healthy meals and do not skip any meals. Try to eat protein with a every meal and eat a healthy snack such as fruit or nuts in between meals. Try to keep a regular sleep-wake schedule and try to exercise daily, particularly in the form of walking, 20-30 minutes a day, if you can.   As far as your medications are concerned, I would like to suggest: Continue Current Medications  I would like to see you back as needed, sooner if we need to. Please call us with any interim questions, concerns, problems, updates or refill requests.   Our phone number is [phone]. We also have an after hours call service for urgent matters and there is a physician on-call for urgent questions. For any emergencies you know to call 911 or go to the nearest emergency room

## 2015-05-30 NOTE — Progress Notes (Signed)
Provider: Dr Jaynee Eagles Referring Provider: Deland Pretty, MD Primary Care Physician: Horatio Pel, MD  CC: stroke  HPI: Patricia [name] is a 81 y.o. female here as a follow up. PMHx hypothyroidism, mild tremor. She is doing well. No new events. No other problems. Her LDL was recently taken and is 57. The end of words are sometimes hard to read. The end of the letters are blurred. Her vision has improved.   MRA:  IMPRESSION: Abnormal MRA brain showing severe atheromatous changes in distal left posterior cerebral artery and moderate atheromatous narrowing of distal right posterior cerebral artery. Persistent fetal pattern of origin of right posterior cerebral and hypoplastic terminal right vertebral and A1 segment of right anterior cerebral arteries are benign birth variants  Interval update 02/20/2015: Patient returns today after findings on MRI of the brain indicate an subacute ischemic event in the occipital area. Explained to patient that this is likely the reason for her symptoms that she was seen in the office last month. The area affected by stroke can also affect vision, and patient still reports blurry vision and difficulty reading. She was accompanied by daughter today. Reviewed all images with this lovely patient and her very nice daughter, showed patient to stroke, discussed the differential and different causes for this. Patient's MRI also showed nonspecific white matter changes likely small vessel chronic ischemic changes. Discussed with daughter and patient that we should start Lipitor, she should continue taking an aspirin for stroke prevention. Also feel as though a complete stroke workup is indicated including imaging of the vessels in the head, echocardiogram with bubble study.  MRi of the brain: IMPRESSION: This is an abnormal MRI of the brain with and without contrast showed the following: 1. Small focus in the left occipital lobe likely representing a small sub-chronic  stroke.  2. Age-appropriate minimal atrophy and mild small vessel ischemic change. Recently reviewed images and agree with this report. Patient also brings in labs which show unremarkable CBC, unremarkable CMP, LDL mildly elevated at 129 goal is less than 70, TSH within normal limits.  Initial visit 01/26/2015: She was at home on March 25th, had a headache on the left frontal area, was tring to change the channel, she couldn't get it to change. She felt confused, doesn't have an app to the change the channel. The controls were right in front of her. She lost consciousness from then until midnight. She regained consciousness in the same place, shocked it was midnight. Tried to use cell phone again, still confused at that time. Didn't know how to get to her bedroom, couldn't get the TV off. She turned the power button off and went to bed. Vision is blurry since the episode, right prisms, muted colors for several days afterwards. No fhx seizure, or migraines. No recents infections but did hit her ankle on March 4th a hitch which was swollen. There was swelling and bruising but no cellulitis. Here with daughter who has never noticed anything unusual. No urinary symptoms. No other episodes of staring, strange behavior, altered awareness, loss of urine, biting of tongue, finding herself on floor or in strange places, getting lost in the car. Doesn't believe she fell asleep. She has a lot of stress, her husband had a stroke. She is main caretaker.    Review of Systems: Patient complains of symptoms per HPI as well as the following symptoms: Denies Chest pain and denies shortness of breath denies fever and systemic symptoms. Pertinent negatives per HPI. All others negative.  Review  of Systems: Patient complains of symptoms per HPI as well as the following symptoms: no CP, no SOB. Pertinent negatives per HPI. All others negative.   Social History   Social History  . Marital Status: Married    Spouse  Name: [name]   . Number of Children: 4  . Years of Education: 16+   Occupational History  . Not on file.   Social History Main Topics  . Smoking status: Never Smoker   . Smokeless tobacco: Not on file  . Alcohol Use: 0.0 oz/week    0 Standard drinks or equivalent per week     Comment: Very rarley   . Drug Use: No  . Sexual Activity: Not on file   Other Topics Concern  . 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 Heart disease Mother   . Lupus Daughter   . Anuerysm Daughter     Brain    Past Medical History  Diagnosis Date  . Hypothyroid     Past Surgical History  Procedure Laterality Date  . Cataract extraction Bilateral   . Eye surgery Left 02/15/15     Laser surgery     Current Outpatient Prescriptions  Medication Sig Dispense Refill  . aspirin 81 MG chewable tablet Chew 81 mg by mouth daily.    Marland Kitchen atorvastatin (LIPITOR) 10 MG tablet Take 1 tablet (10 mg total) by mouth daily. 30 tablet 11  . Biotin 5000 MCG TABS Take 1 tablet by mouth daily.    Marland Kitchen CALCIUM PO Take 1,000 mg by mouth daily.    . Cholecalciferol (VITAMIN D-3 PO) Take 1,000 mg by mouth daily.    . DiphenhydrAMINE HCl (BENADRYL PO) Take 1 tablet by mouth every evening.    . Flaxseed, Linseed, (FLAX SEED OIL PO) Take 1,000 mg by mouth daily.    Marland Kitchen GLUCOSAMINE-CHONDROITIN PO Take 1,500 mg by mouth daily.    Marland Kitchen levothyroxine (SYNTHROID, LEVOTHROID) 88 MCG tablet Take 88 mcg by mouth daily before breakfast.    . MELATONIN PO Take 5 mg by mouth daily.    . Misc Natural Products (COLON CLEANSE PO) Take 450 mg by mouth daily.    . Multiple Vitamin (MULTIVITAMIN) tablet Take 1 tablet by mouth daily.    . Omega-3 Fatty Acids (FISH OIL PO) Take 1,200 mg by mouth daily.    Marland Kitchen POTASSIUM PO Take 550 mg by mouth daily.    . Probiotic Product (PROBIOTIC DAILY PO) Take 1  tablet by mouth daily.    . sertraline (ZOLOFT) 50 MG tablet Take 25 mg by mouth daily.    . TURMERIC PO Take 750 mg by mouth daily.    . vitamin B-12 (CYANOCOBALAMIN) 1000 MCG tablet Take 1,000 mcg by mouth daily.    . vitamin C (ASCORBIC ACID) 500 MG tablet Take 500 mg by mouth daily.     No current facility-administered medications for this visit.    Allergies as of 05/30/2015  . (No Known Allergies)    Vitals: There were no vitals taken for this visit. Last Weight:  Wt Readings from Last 1 Encounters:  02/20/15 157 lb 9.6 oz (71.487 kg)   Last Height:   Ht Readings from Last 1 Encounters:  02/20/15 5\' 7"  (1.702 m)   Speech:  Speech is  normal; fluent and spontaneous with normal comprehension.  Cognition:  The patient is oriented to person, place, and time;   recent and remote memory intact;   language fluent;   normal attention, concentration,   fund of knowledge Cranial Nerves:  The pupils are equal, round, and reactive to light. The fundi are flat. Visual fields are full to finger confrontation. Extraocular movements are intact. Trigeminal sensation is intact and the muscles of mastication are normal. The face is symmetric. The palate elevates in the midline. Hearing intact. Voice is normal. Shoulder shrug is normal. The tongue has normal motion without fasciculations.      Assessment/Plan: This is a lovely 81 year old female who is here with her daughter for follow-up. She presented in April after an episode of altered awareness and possible loss of consciousness with vision changes. MRI of the brain showed a subacute left occipital ischemic infarct. MRA showed severe atheromatous changes in distal left posterior cerebral artery and moderate atheromatous narrowing of distal right posterior cerebral artery.   Follow very closely for primary care for management of vascular risk factors Patient started taking aspirin daily after discussing with me, she  should continue to take this for stroke prevention Started patient on Lipitor for LDL 129 goal less than 70 Carotid Dopplers were already completed and unremarkable. echocardiogram with bubble study unremarkable.  Addendum: Labs collected 05/23/2015 showed LDL of 57, HDL of 81, triglycerides 50, cholesterol total 148. CBC unremarkable. LFTs unremarkable. TSH within normal limits. Sedimentation rate 12.  Sarina Ill, MD  [facility] [address] [place], [place] [address]  Phone [phone] Fax [phone]  A total of 15 minutes was spent face-to-face with this patient. Over half this time was spent on counseling patient on the left occipital stroke due to distal left pca artery atheromatous changes, diagnosis and different diagnostic and therapeutic options available.

## 2015-07-11 ENCOUNTER — Ambulatory Visit
Admission: RE | Admit: 2015-07-11 | Discharge: 2015-07-11 | Disposition: A | Discharge status: Home / Self Care | Payer: Medicare Other | Source: Ambulatory Visit | Attending: Gastroenterology | Admitting: Gastroenterology

## 2015-07-11 ENCOUNTER — Other Ambulatory Visit: Payer: Self-pay | Admitting: Gastroenterology

## 2015-07-11 DIAGNOSIS — K5901 Slow transit constipation: Secondary | ICD-10-CM

## 2015-07-14 ENCOUNTER — Other Ambulatory Visit: Payer: Self-pay | Admitting: Gastroenterology

## 2015-07-14 ENCOUNTER — Ambulatory Visit
Admission: RE | Admit: 2015-07-14 | Discharge: 2015-07-14 | Disposition: A | Discharge status: Home / Self Care | Payer: Medicare Other | Source: Ambulatory Visit | Attending: Gastroenterology | Admitting: Gastroenterology

## 2015-07-14 DIAGNOSIS — K5900 Constipation, unspecified: Secondary | ICD-10-CM

## 2015-07-18 ENCOUNTER — Ambulatory Visit
Admission: RE | Admit: 2015-07-18 | Discharge: 2015-07-18 | Disposition: A | Discharge status: Home / Self Care | Payer: Medicare Other | Source: Ambulatory Visit | Attending: Gastroenterology | Admitting: Gastroenterology

## 2015-07-18 ENCOUNTER — Other Ambulatory Visit: Payer: Self-pay | Admitting: Gastroenterology

## 2015-07-18 DIAGNOSIS — K5909 Other constipation: Secondary | ICD-10-CM

## 2016-01-10 ENCOUNTER — Ambulatory Visit (INDEPENDENT_AMBULATORY_CARE_PROVIDER_SITE_OTHER): Payer: Medicare Other | Admitting: Neurology

## 2016-01-10 ENCOUNTER — Encounter: Payer: Self-pay | Admitting: Neurology

## 2016-01-10 VITALS — BP 145/70 | HR 69 | Ht 67.0 in | Wt 167.2 lb

## 2016-01-10 DIAGNOSIS — R208 Other disturbances of skin sensation: Secondary | ICD-10-CM | POA: Diagnosis not present

## 2016-01-10 DIAGNOSIS — R7302 Impaired glucose tolerance (oral): Secondary | ICD-10-CM | POA: Diagnosis not present

## 2016-01-10 DIAGNOSIS — G629 Polyneuropathy, unspecified: Secondary | ICD-10-CM | POA: Diagnosis not present

## 2016-01-10 DIAGNOSIS — R209 Unspecified disturbances of skin sensation: Secondary | ICD-10-CM

## 2016-01-10 DIAGNOSIS — R29818 Other symptoms and signs involving the nervous system: Secondary | ICD-10-CM

## 2016-01-10 DIAGNOSIS — Z79899 Other long term (current) drug therapy: Secondary | ICD-10-CM

## 2016-01-10 DIAGNOSIS — E538 Deficiency of other specified B group vitamins: Secondary | ICD-10-CM

## 2016-01-10 DIAGNOSIS — M6289 Other specified disorders of muscle: Secondary | ICD-10-CM | POA: Diagnosis not present

## 2016-01-10 DIAGNOSIS — IMO0001 Reserved for inherently not codable concepts without codable children: Secondary | ICD-10-CM

## 2016-01-10 DIAGNOSIS — R2689 Other abnormalities of gait and mobility: Secondary | ICD-10-CM

## 2016-01-10 DIAGNOSIS — Z9181 History of falling: Secondary | ICD-10-CM | POA: Diagnosis not present

## 2016-01-10 DIAGNOSIS — R269 Unspecified abnormalities of gait and mobility: Secondary | ICD-10-CM | POA: Diagnosis not present

## 2016-01-10 DIAGNOSIS — E519 Thiamine deficiency, unspecified: Secondary | ICD-10-CM | POA: Diagnosis not present

## 2016-01-10 DIAGNOSIS — R29898 Other symptoms and signs involving the musculoskeletal system: Secondary | ICD-10-CM

## 2016-01-10 NOTE — Progress Notes (Signed)
[facility]    Provider:  Dr Jaynee Eagles Referring Provider: Deland Pretty, MD Primary Care Physician:  Horatio Pel, MD  CC: stroke  Interval history 01/10/2016:  Patient is a lovely 82 year old female who I have seen in the past for an ischemic occipital stroke. She is here for a new problem. The left top of her foot was swollen. A skin biopsy at her pediatrician's diagnosed with what sounds like small fiber neuropathy. She has some cramping of the hands. Her podiatrist diagnosed her with neuropathy. She has numbness and tingling in the feet. She also has balance problems. She has some cramping in the feet. Both of her feet with electric shocks in the toes. Been going on for about a year. Balance is worsening.No falls. Her husband recently passed away. Feels like she is walking on pads. Had a lengthy discussion idiopathic small fiber neuropathy, diabetes which is the most common diagnosis that there are a variety of other conditions that can cause this. Often it is idiopathic. Discussed the pathophysiology and the reason for impaired balance.  HPI: Derri Demeyer is a 82 y.o. female here as a follow up. PMHx hypothyroidism, mild tremor. She is doing well. No new events. No other problems. Her LDL was recently taken and is 57. The end of words are sometimes hard to read. The end of the letters are blurred. Her vision has improved.   MRA: IMPRESSION: Abnormal MRA brain showing severe atheromatous changes in distal left posterior cerebral artery and moderate atheromatous narrowing of distal right posterior cerebral artery. Persistent fetal pattern of origin of right posterior cerebral and hypoplastic terminal right vertebral and A1 segment of right anterior cerebral arteries are benign birth variants  Interval update 02/20/2015: Patient returns today after findings on MRI of the brain indicate an subacute ischemic event in the occipital area. Explained to patient that this is  likely the reason for her symptoms that she was seen in the office last month. The area affected by stroke can also affect vision, and patient still reports blurry vision and difficulty reading. She was accompanied by daughter today. Reviewed all images with this lovely patient and her very nice daughter, showed patient to stroke, discussed the differential and different causes for this. Patient's MRI also showed nonspecific white matter changes likely small vessel chronic ischemic changes. Discussed with daughter and patient that we should start Lipitor, she should continue taking an aspirin for stroke prevention. Also feel as though a complete stroke workup is indicated including imaging of the vessels in the head, echocardiogram with bubble study.  MRi of the brain: IMPRESSION: This is an abnormal MRI of the brain with and without contrast showed the following: 1. Small focus in the left occipital lobe likely representing a small sub-chronic stroke.  2. Age-appropriate minimal atrophy and mild small vessel ischemic change. Recently reviewed images and agree with this report. Patient also brings in labs which show unremarkable CBC, unremarkable CMP, LDL mildly elevated at 129 goal is less than 70, TSH within normal limits.  Initial visit 01/26/2015: She was at home on March 25th, had a headache on the left frontal area, was tring to change the channel, she couldn't get it to change. She felt confused, doesn't have an app to the change the channel. The controls were right in front of her. She lost consciousness from then until midnight. She regained consciousness in the same place, shocked it was midnight. Tried to use cell phone again, still confused at that time. Didn't  know how to get to her bedroom, couldn't get the TV off. She turned the power button off and went to bed. Vision is blurry since the episode, right prisms, muted colors for several days afterwards. No fhx seizure, or migraines. No  recents infections but did hit her ankle on March 4th a hitch which was swollen. There was swelling and bruising but no cellulitis. Here with daughter who has never noticed anything unusual. No urinary symptoms. No other episodes of staring, strange behavior, altered awareness, loss of urine, biting of tongue, finding herself on floor or in strange places, getting lost in the car. Doesn't believe she fell asleep. She has a lot of stress, her husband had a stroke. She is main caretaker.    Review of Systems: Patient complains of symptoms per HPI as well as the following symptoms: Denies Chest pain and denies shortness of breath denies fever and systemic symptoms. Pertinent negatives per HPI. All others negative.  Review of Systems: Patient complains of symptoms per HPI as well as the following symptoms: no CP, no SOB. Pertinent negatives per HPI. All others negative   Social History   Social History  . Marital Status: Married    Spouse Name: [name]   . Number of Children: 4  . Years of Education: 16+   Occupational History  . Not on file.   Social History Main Topics  . Smoking status: Never Smoker   . Smokeless tobacco: Not on file  . Alcohol Use: 0.0 oz/week    0 Standard drinks or equivalent per week     Comment: Very rarley   . Drug Use: No  . Sexual Activity: Not on file   Other Topics Concern  . 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 Heart disease Mother   . Lupus Daughter   . Anuerysm Daughter     Brain    Past Medical History  Diagnosis Date  . Hypothyroid     Past Surgical History  Procedure Laterality Date  . Cataract extraction Bilateral   . Eye surgery Left 02/15/15     Laser surgery     Current Outpatient Prescriptions  Medication Sig Dispense Refill  . aspirin 81 MG chewable tablet Chew 81 mg by  mouth daily.    Marland Kitchen atorvastatin (LIPITOR) 10 MG tablet Take 1 tablet (10 mg total) by mouth daily. 30 tablet 11  . Biotin 5000 MCG TABS Take 1 tablet by mouth daily.    Marland Kitchen CALCIUM PO Take 1,000 mg by mouth daily.    . Cholecalciferol (VITAMIN D-3 PO) Take 1,000 mg by mouth daily.    . Flaxseed, Linseed, (FLAX SEED OIL PO) Take 1,000 mg by mouth daily.    Marland Kitchen GLUCOSAMINE-CHONDROITIN PO Take 1,500 mg by mouth daily.    Marland Kitchen levothyroxine (SYNTHROID, LEVOTHROID) 88 MCG tablet Take 88 mcg by mouth daily before breakfast.    . MELATONIN PO Take 5 mg by mouth daily.    . Misc Natural Products (COLON CLEANSE PO) Take 450 mg by mouth daily.    . Multiple Vitamin (MULTIVITAMIN) tablet Take 1 tablet by mouth daily.    . Omega-3 Fatty Acids (FISH OIL PO) Take 1,200 mg by mouth daily.    Marland Kitchen POTASSIUM PO Take 550 mg by mouth daily.    Marland Kitchen  Probiotic Product (PROBIOTIC DAILY PO) Take 1 tablet by mouth daily.    . sertraline (ZOLOFT) 50 MG tablet Take 25 mg by mouth daily.    . TURMERIC PO Take 750 mg by mouth daily.    . vitamin B-12 (CYANOCOBALAMIN) 1000 MCG tablet Take 1,000 mcg by mouth daily.    . vitamin C (ASCORBIC ACID) 500 MG tablet Take 500 mg by mouth daily.     No current facility-administered medications for this visit.    Allergies as of 01/10/2016  . (No Known Allergies)    Vitals: BP 145/70 mmHg  Pulse 69  Ht 5\' 7"  (1.702 m)  Wt 167 lb 3.2 oz (75.841 kg)  BMI 26.18 kg/m2  SpO2 97% Last Weight:  Wt Readings from Last 1 Encounters:  01/10/16 167 lb 3.2 oz (75.841 kg)   Last Height:   Ht Readings from Last 1 Encounters:  01/10/16 5\' 7"  (1.702 m)    Speech:  Speech is normal; fluent and spontaneous with normal comprehension.  Cognition:  The patient is oriented to person, place, and time;   recent and remote memory intact;   language fluent;   normal attention, concentration,   fund of knowledge Cranial Nerves:  The pupils are equal, round, and reactive to  light. The fundi are flat. Visual fields are full to finger confrontation. Extraocular movements are intact. Trigeminal sensation is intact and the muscles of mastication are normal. The face is symmetric. The palate elevates in the midline. Hearing intact. Voice is normal. Shoulder shrug is normal. The tongue has normal motion without fasciculations.   Sensation: Intact pin prick LE, decreased temperature glove and stocking. Absent vibration at the great toes, few seconds at medial malleoli. Impaired proprioception distally.  Sway on [place]berg.  Motor observation: Some loss of muscle, atrophy in the intrinsic hand muscles most pronounced in the FDI and hyperthenar areas.   Assessment/Plan: This is a lovely 82 year old female who is here with her daughter for follow-up. She presented in April after an episode of altered awareness and possible loss of consciousness with vision changes. MRI of the brain showed a subacute left occipital ischemic infarct. MRA showed severe atheromatous changes in distal left posterior cerebral artery and moderate atheromatous narrowing of distal right posterior cerebral artery. She has been recently diagnosed with neuropathy in the feet. Exam does show decreased sensation in a glove and stocking distribution in both small and large fibers.   Neuropathy: serum panel and also an emg/ncs of bilateral uppers for eval of CTS and right leg for neuropathy. Include one radial sensory in the uppers as well.   Follow very closely for primary care for management of vascular risk factors Patient started taking aspirin daily after discussing with me, she should continue to take this for stroke prevention Started patient on Lipitor for LDL 129 goal less than 70 Carotid Dopplers were already completed and unremarkable. echocardiogram with bubble study unremarkable.  Addendum: Labs collected 05/23/2015 showed LDL of 57, HDL of 81, triglycerides 50, cholesterol total 148. CBC  unremarkable. LFTs unremarkable. TSH within normal limits. Sedimentation rate 12.  Sarina Ill, MD  [facility] [address] [place], [place] [address]  Phone [phone] Fax [phone]  A total of 30 minutes was spent face-to-face with this patient. Over half this time was spent on counseling patient on the left occipital stroke due to distal left pca artery atheromatous changes, distal polyneuropathy diagnosis and different diagnostic and therapeutic options available.

## 2016-01-10 NOTE — Patient Instructions (Signed)
Remember to drink plenty of fluid, eat healthy meals and do not skip any meals. Try to eat protein with a every meal and eat a healthy snack such as fruit or nuts in between meals. Try to keep a regular sleep-wake schedule and try to exercise daily, particularly in the form of walking, 20-30 minutes a day, if you can.   As far as diagnostic testing: labs, emg/ncs, physical therapy  I would like to see you back in or emg/ncs, sooner if we need to. Please call us with any interim questions, concerns, problems, updates or refill requests.   Our phone number is [phone]. We also have an after hours call service for urgent matters and there is a physician on-call for urgent questions. For any emergencies you know to call 911 or go to the nearest emergency room

## 2016-01-15 DIAGNOSIS — G629 Polyneuropathy, unspecified: Secondary | ICD-10-CM | POA: Insufficient documentation

## 2016-01-15 LAB — CBC WITH DIFFERENTIAL/PLATELET
Basophils Absolute: 0.1 10*3/uL (ref 0.0–0.2)
Basos: 1 %
EOS (ABSOLUTE): 0.1 10*3/uL (ref 0.0–0.4)
Eos: 3 %
Hematocrit: 38.8 % (ref 34.0–46.6)
Hemoglobin: 12.6 g/dL (ref 11.1–15.9)
Immature Grans (Abs): 0 10*3/uL (ref 0.0–0.1)
Immature Granulocytes: 0 %
Lymphocytes Absolute: 1.2 10*3/uL (ref 0.7–3.1)
Lymphs: 23 %
MCH: 30 pg (ref 26.6–33.0)
MCHC: 32.5 g/dL (ref 31.5–35.7)
MCV: 92 fL (ref 79–97)
Monocytes Absolute: 0.4 10*3/uL (ref 0.1–0.9)
Monocytes: 8 %
Neutrophils Absolute: 3.4 10*3/uL (ref 1.4–7.0)
Neutrophils: 65 %
Platelets: 249 10*3/uL (ref 150–379)
RBC: 4.2 x10E6/uL (ref 3.77–5.28)
RDW: 13.6 % (ref 12.3–15.4)
WBC: 5.3 10*3/uL (ref 3.4–10.8)

## 2016-01-15 LAB — COMPREHENSIVE METABOLIC PANEL
ALT: 16 IU/L (ref 0–32)
AST: 20 IU/L (ref 0–40)
Albumin/Globulin Ratio: 1.4 (ref 1.2–2.2)
Albumin: 4.3 g/dL (ref 3.5–4.7)
Alkaline Phosphatase: 50 IU/L (ref 39–117)
BUN/Creatinine Ratio: 31 — ABNORMAL HIGH (ref 12–28)
BUN: 22 mg/dL (ref 8–27)
Bilirubin Total: 0.4 mg/dL (ref 0.0–1.2)
CO2: 24 mmol/L (ref 18–29)
Calcium: 9.8 mg/dL (ref 8.7–10.3)
Chloride: 98 mmol/L (ref 96–106)
Creatinine, Ser: 0.71 mg/dL (ref 0.57–1.00)
GFR calc Af Amer: 91 mL/min/{1.73_m2} (ref >59)
GFR calc non Af Amer: 79 mL/min/{1.73_m2} (ref >59)
Globulin, Total: 3.1 g/dL (ref 1.5–4.5)
Glucose: 96 mg/dL (ref 65–99)
Potassium: 4.3 mmol/L (ref 3.5–5.2)
Sodium: 140 mmol/L (ref 134–144)
Total Protein: 7.4 g/dL (ref 6.0–8.5)

## 2016-01-15 LAB — B. BURGDORFI ANTIBODIES: Lyme IgG/IgM Ab: <0.91 {ISR} (ref 0.00–0.90)

## 2016-01-15 LAB — HIV ANTIBODY (ROUTINE TESTING W REFLEX): HIV Screen 4th Generation wRfx: NONREACTIVE

## 2016-01-15 LAB — MULTIPLE MYELOMA PANEL, SERUM
Albumin SerPl Elph-Mcnc: 4.2 g/dL (ref 2.9–4.4)
Albumin/Glob SerPl: 1.4 (ref 0.7–1.7)
Alpha 1: 0.2 g/dL (ref 0.0–0.4)
Alpha2 Glob SerPl Elph-Mcnc: 0.5 g/dL (ref 0.4–1.0)
B-Globulin SerPl Elph-Mcnc: 1.5 g/dL — ABNORMAL HIGH (ref 0.7–1.3)
Gamma Glob SerPl Elph-Mcnc: 0.9 g/dL (ref 0.4–1.8)
Globulin, Total: 3.2 g/dL (ref 2.2–3.9)
IgA/Immunoglobulin A, Serum: 1140 mg/dL — ABNORMAL HIGH (ref 64–422)
IgG (Immunoglobin G), Serum: 902 mg/dL (ref 700–1600)
IgM (Immunoglobulin M), Srm: 133 mg/dL (ref 26–217)
M Protein SerPl Elph-Mcnc: 0.5 g/dL — ABNORMAL HIGH

## 2016-01-15 LAB — RHEUMATOID FACTOR: Rhuematoid fact SerPl-aCnc: <10 IU/mL (ref 0.0–13.9)

## 2016-01-15 LAB — HEPATITIS C ANTIBODY: Hep C Virus Ab: <0.1 s/co ratio (ref 0.0–0.9)

## 2016-01-15 LAB — ANA COMPREHENSIVE PANEL
Anti JO-1: <0.2 AI (ref 0.0–0.9)
Centromere Ab Screen: <0.2 AI (ref 0.0–0.9)
Chromatin Ab SerPl-aCnc: <0.2 AI (ref 0.0–0.9)
ENA RNP Ab: <0.2 AI (ref 0.0–0.9)
ENA SM Ab Ser-aCnc: <0.2 AI (ref 0.0–0.9)
ENA SSA (RO) Ab: <0.2 AI (ref 0.0–0.9)
ENA SSB (LA) Ab: <0.2 AI (ref 0.0–0.9)
Scleroderma SCL-70: <0.2 AI (ref 0.0–0.9)
dsDNA Ab: <1 IU/mL (ref 0–9)

## 2016-01-15 LAB — GLIADIN ANTIBODIES, SERUM
Antigliadin Abs, IgA: 9 units (ref 0–19)
Gliadin IgG: 3 units (ref 0–19)

## 2016-01-15 LAB — ANA: ANA Titer 1: NEGATIVE

## 2016-01-15 LAB — PAN-ANCA
ANCA Proteinase 3: <3.5 U/mL (ref 0.0–3.5)
Atypical pANCA: <1:20 {titer}
C-ANCA: <1:20 {titer}
Myeloperoxidase Ab: <9 U/mL (ref 0.0–9.0)
P-ANCA: <1:20 {titer}

## 2016-01-15 LAB — HEMOGLOBIN A1C
Est. average glucose Bld gHb Est-mCnc: 123 mg/dL
Hgb A1c MFr Bld: 5.9 % — ABNORMAL HIGH (ref 4.8–5.6)

## 2016-01-15 LAB — METHYLMALONIC ACID, SERUM: Methylmalonic Acid: 176 nmol/L (ref 0–378)

## 2016-01-15 LAB — VITAMIN B6: Vitamin B6: 42.2 ug/L — ABNORMAL HIGH (ref 2.0–32.8)

## 2016-01-15 LAB — HEAVY METALS, BLOOD
Arsenic: 5 ug/L (ref 2–23)
Lead, Blood: 2 ug/dL (ref 0–19)
Mercury: 1.1 ug/L (ref 0.0–14.9)

## 2016-01-15 LAB — VITAMIN B1: Thiamine: 139.3 nmol/L (ref 66.5–200.0)

## 2016-01-15 LAB — SEDIMENTATION RATE: Sed Rate: 11 mm/hr (ref 0–40)

## 2016-01-15 LAB — B12 AND FOLATE PANEL
Folate: >20 ng/mL (ref >3.0)
Vitamin B-12: 1590 pg/mL — ABNORMAL HIGH (ref 211–946)

## 2016-01-15 LAB — RPR: RPR Ser Ql: NONREACTIVE

## 2016-01-15 LAB — TSH: TSH: 0.939 u[IU]/mL (ref 0.450–4.500)

## 2016-01-15 LAB — ANGIOTENSIN CONVERTING ENZYME: Angio Convert Enzyme: 29 U/L (ref 14–82)

## 2016-01-15 LAB — TISSUE TRANSGLUTAMINASE, IGA: Transglutaminase IgA: <2 U/mL (ref 0–3)

## 2016-01-23 ENCOUNTER — Telehealth: Payer: Self-pay | Admitting: Neurology

## 2016-01-23 ENCOUNTER — Other Ambulatory Visit: Payer: Self-pay | Admitting: Neurology

## 2016-01-23 DIAGNOSIS — D472 Monoclonal gammopathy: Secondary | ICD-10-CM

## 2016-01-23 DIAGNOSIS — D890 Polyclonal hypergammaglobulinemia: Principal | ICD-10-CM

## 2016-01-23 NOTE — Telephone Encounter (Signed)
Spoke to patient. Labs were unremarkable except IFE found IgA monoclonal protein with kappa light chain. Will refer to Dr. Alvy Bimler for further evaluation.

## 2016-01-26 ENCOUNTER — Ambulatory Visit: Payer: Medicare Other | Admitting: Physical Therapy

## 2016-01-30 ENCOUNTER — Ambulatory Visit: Payer: Medicare Other | Admitting: Physical Therapy

## 2016-01-31 ENCOUNTER — Ambulatory Visit: Payer: 59 | Attending: Neurology | Admitting: Rehabilitative and Restorative Service Providers"

## 2016-01-31 DIAGNOSIS — R2689 Other abnormalities of gait and mobility: Secondary | ICD-10-CM

## 2016-01-31 DIAGNOSIS — M6281 Muscle weakness (generalized): Secondary | ICD-10-CM | POA: Diagnosis present

## 2016-02-01 ENCOUNTER — Telehealth: Payer: Self-pay | Admitting: Hematology and Oncology

## 2016-02-01 ENCOUNTER — Encounter: Payer: Self-pay | Admitting: Hematology and Oncology

## 2016-02-01 DIAGNOSIS — R2689 Other abnormalities of gait and mobility: Secondary | ICD-10-CM | POA: Diagnosis not present

## 2016-02-01 NOTE — Telephone Encounter (Signed)
Spoke with patient re new patient appointment with NG 02/16/16 @ 2:30 pm to arrive 2 pm. Patient demographic and insurance information confirmed.

## 2016-02-01 NOTE — Therapy (Signed)
[facility] [address] [place], [place], [address] Phone: [phone]   Fax:  [phone]  Physical Therapy Evaluation  Patient Details  Name: Patricia [name] MRN: [id number] Date of Birth: Apr 13, 1930 Referring Provider: Heide Spark, MD  Encounter Date: 01/31/2016      PT End of Session - 02/01/16 1431    Visit Number 1   Number of Visits 8   Date for PT Re-Evaluation 03/02/16   Authorization Type G code every 10th visit   PT Start Time 0930   PT Stop Time 1015   PT Time Calculation (min) 45 min   Equipment Utilized During Treatment Gait belt   Activity Tolerance Patient tolerated treatment well   Behavior During Therapy [facility] for tasks assessed/performed      Past Medical History  Diagnosis Date  . Hypothyroid     Past Surgical History  Procedure Laterality Date  . Cataract extraction Bilateral   . Eye surgery Left 02/15/15     Laser surgery     There were no vitals filed for this visit.       Subjective Assessment - 01/31/16 0945    Subjective The patient reports onset of lower leg numbness (no pain) and imbalance worsening recently.  She initially noticed she had to wear tennis shoes to be stable from a balance standpoint and had to stop wearing sandals or flip flops.  She does have intermittent tingling in feet.  She has not had any falls, but does trip over carpet edges.     Patient Stated Goals Improve strength of legs and improve walking   Currently in Pain? No/denies            [facility] PT Assessment - 01/31/16 0948    Assessment   Medical Diagnosis Peripheral neuropathy, imbalance   Referring Provider Heide Spark, MD   Onset Date/Surgical Date 01/10/16   Prior Therapy none   Precautions   Precautions Fall   Balance Screen   Has the patient fallen in the past 6 months No   Has the patient had a decrease in activity level because of a fear of falling?  No   Is the patient reluctant to  leave their home because of a fear of falling?  No   Home Environment   Living Environment Private residence   Living Arrangements Alone  daughter will be moving into basement soon   Type of [place] to enter   Entrance Stairs-Number of Steps 3   Entrance Stairs-Rails Can reach both   Home Layout Two level   Alternate Level Stairs-Number of Steps 12   Alternate Level Stairs-Rails Can reach both   Prior Function   Level of Independence Independent  has help from family to maintain yard, has help to clean hom   Observation/Other Assessments   Focus on Therapeutic Outcomes (FOTO)  69%   Other Surveys  --  ABC=60.6%   Sensation   Light Touch Impaired Detail   Light Touch Impaired Details Impaired RLE;Impaired LLE  hands cramp easily and go to sleep easily   Additional Comments Vision changes * can reach the first part of the word to capture the last part of the word.  She reports reading numbers in a row is challenging. 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   Strength   Overall Strength Deficits   Strength Assessment Site Shoulder;Elbow;Hip;Knee;Ankle   Right/Left Shoulder Right;Left   Right Shoulder Flexion 3/5  with pain   Right Shoulder ABduction 3/5   Left Shoulder Flexion 4/5   Left Shoulder ABduction 4/5   Right/Left Elbow Right;Left   Right Elbow Flexion 5/5   Left Elbow Flexion 5/5   Right/Left Hip Right;Left   Right Hip Flexion 4/5   Left Hip Flexion 4/5   Right/Left Knee Right;Left   Right Knee Flexion 4/5   Right Knee Extension 4/5   Left Knee Flexion 4/5   Left Knee Extension 4/5   Right/Left Ankle  Right;Left   Right Ankle Dorsiflexion 3+/5   Left Ankle Dorsiflexion 4/5   Ambulation/Gait   Ambulation/Gait Yes   Ambulation/Gait Assistance 7: Independent   Ambulation Distance (Feet) 200 Feet   Assistive device None   Gait Pattern Decreased stride length;Shuffle;Trunk flexed  patient with dec'd heel strike    Ambulation Surface Level   Gait velocity 2.59 ft/sec   Stairs Yes   Stairs Assistance 6: Modified independent (Device/Increase time)   Stair Management Technique Alternating pattern;Two rails   Number of Stairs 4   Gait Comments Patient has decreased hip extension during gait.     Standardized Balance Assessment   Standardized Balance Assessment Berg Balance Test;Timed Up and Go Test   Berg Balance Test   Sit to Stand Able to stand  independently using hands   Standing Unsupported Able to stand safely 2 minutes   Sitting with Back Unsupported but Feet Supported on Floor or Stool Able to sit safely and securely 2 minutes   Stand to Sit Sits safely with minimal use of hands   Transfers Able to transfer safely, minor use of hands   Standing Unsupported with Eyes Closed Able to stand 3 seconds   Standing Ubsupported with Feet Together Able to place feet together independently and stand 1 minute safely   From Standing, Reach Forward with Outstretched Arm Can reach confidently >25 cm (10")   From Standing Position, Pick up Object from Floor Able to pick up shoe, needs supervision   From Standing Position, Turn to Look Behind Over each Shoulder Turn sideways only but maintains balance   Turn 360 Degrees Able to turn 360 degrees safely but slowly   Standing Unsupported, Alternately Place Feet on Step/Stool Able to complete >2 steps/needs minimal assist  mild loss of balance posteriorly   Standing Unsupported, One Foot in Front Able to plae foot ahead of the other independently and hold 30 seconds   Standing on One Leg Able to lift leg independently and hold equal to or more than 3  seconds   Total Score 42   Berg comment: 42/56 indicating fall risk   Timed Up and Go Test   TUG --  13.67 seconds without a device              PT Education - 02/01/16 1431    Education provided Yes   Education Details Goals of PT and increasing activity level in safe ways.   Person(s) Educated Patient   Methods Explanation   Comprehension Verbalized understanding          PT Short Term Goals - 02/01/16 1432  PT SHORT TERM GOAL #1   Title STGs=LTGs           PT Long Term Goals - 2016-02-21 1432    PT LONG TERM GOAL #1   Title The patient will be indep with HEP for high level balance, LE strength and general mobility.   Baseline Target date 03/02/2016   Time 4   Period Weeks   PT LONG TERM GOAL #2   Title The patient will increase Berg from 42/56 to > or equal to 48/56 to demo decreasing risk for falls.   Baseline Target date 03/02/2016   Time 4   Period Weeks   PT LONG TERM GOAL #3   Title The patient improve gait speed from 2.59 ft/sec to > or equal to 3.0 ft/sec to demo improving mobility.   Baseline Target date 03/02/2016   Time 4   Period Weeks   PT LONG TERM GOAL #4   Title The patient will improve ABC scale from 60.6% to > or equal to 72% to demo improving confidence with balance activities.   Baseline Target date 03/02/2016   Time 4   Period Weeks   PT LONG TERM GOAL #5   Title The patient will verbalize understanding of community exercises for post d/c activities.   Baseline Target date 03/02/2016   Time 4   Period Weeks               Plan - 02-21-2016 1439    Clinical Impression Statement The patient is an 82 yo female presenting to PT with neuropathy, LE weakness and declining functional activities.  PT to emphasize increasing activity levels through HEP and community activities to improve overall status.   Rehab Potential Good   PT Frequency 2x / week   PT Duration 4 weeks   PT Treatment/Interventions ADLs/Self Care Home  Management;Balance training;Neuromuscular re-education;Patient/family education;Functional mobility training;Therapeutic activities;Therapeutic exercise;Gait training   PT Next Visit Plan instruct in HEP (components of [place]) for LE strengthening and balance, dynamic gait activities in PT, multi-sensory balance training   Consulted and Agree with Plan of Care Patient      Patient will benefit from skilled therapeutic intervention in order to improve the following deficits and impairments:  Abnormal gait, Difficulty walking, Decreased balance, Impaired sensation, Decreased mobility, Decreased strength  Visit Diagnosis: Other abnormalities of gait and mobility  Muscle weakness (generalized)      G-Codes - 02/21/2016 1442    Functional Assessment Tool Used Berg=42/56   Functional Limitation Mobility: Walking and moving around   Mobility: Walking and Moving Around Current Status [id number]) At least 20 percent but less than 40 percent impaired, limited or restricted       Problem List Patient Active Problem List   Diagnosis Date Noted  . Polyneuropathy ([place]) 01/15/2016  . Neuropathy ([place]) 01/10/2016  . Occipital stroke ([place]) 02/21/2015  . Altered mental status 01/26/2015  . Acute encephalopathy 01/26/2015  . Acute loss of vision 01/26/2015    Sharnese Heath, PT Feb 21, 2016, 2:42 PM  [facility] [address] [place], [place], [address] Phone: [phone]   Fax:  [phone]  Name: Safira Mattsson MRN: [id number] Date of Birth: [birth year]

## 2016-02-07 ENCOUNTER — Encounter: Payer: Self-pay | Admitting: Physical Therapy

## 2016-02-07 ENCOUNTER — Ambulatory Visit: Payer: Medicare Other | Attending: Neurology | Admitting: Physical Therapy

## 2016-02-07 DIAGNOSIS — M6281 Muscle weakness (generalized): Secondary | ICD-10-CM | POA: Insufficient documentation

## 2016-02-07 DIAGNOSIS — R2689 Other abnormalities of gait and mobility: Secondary | ICD-10-CM | POA: Insufficient documentation

## 2016-02-07 NOTE — Therapy (Signed)
[facility] [address] [place], [place], [address] Phone: [phone]   Fax:  [phone]  Physical Therapy Treatment  Patient Details  Name: Patricia [name] MRN: [id number] Date of Birth: [birth year] Referring Provider: Heide Spark, MD  Encounter Date: 02/07/2016      PT End of Session - 02/07/16 1102    Visit Number 2   Number of Visits 8   Date for PT Re-Evaluation 03/02/16   Authorization Type G code every 10th visit   PT Start Time 1022   PT Stop Time 1100   PT Time Calculation (min) 38 min   Equipment Utilized During Treatment Gait belt   Activity Tolerance Patient tolerated treatment well   Behavior During Therapy [facility] for tasks assessed/performed      Past Medical History  Diagnosis Date  . Hypothyroid     Past Surgical History  Procedure Laterality Date  . Cataract extraction Bilateral   . Eye surgery Left 02/15/15     Laser surgery     There were no vitals filed for this visit.      Subjective Assessment - 02/07/16 1023    Subjective Nothing to report.   Patient Stated Goals Improve strength of legs and improve walking   Currently in Pain? No/denies                              Balance Exercises - 02/07/16 1025    OTAGO PROGRAM   Knee Bends 20 reps, support   Backwards Walking Support   Walking and Turning Around No assistive device   Sideways Walking No assistive device   Tandem Stance 10 seconds, support   Tandem Walk Support   One Leg Stand 10 seconds, support   Heel Walking Support   Toe Walk Support   Sit to Stand 10 reps, bilateral support  1UE support   Stair Walking 4 steps x4 progressing from 2 to no rails  supervision level.           PT Education - 02/07/16 1044    Education provided Yes   Education Details Initiated balance exercises from [place] program for [employer]) Educated Patient   Methods Explanation;Demonstration;Verbal  cues;Handout   Comprehension Verbalized understanding;Returned demonstration;Need further instruction          PT Short Term Goals - 02/01/16 1432    PT SHORT TERM GOAL #1   Title STGs=LTGs           PT Long Term Goals - 02/01/16 1432    PT LONG TERM GOAL #1   Title The patient will be indep with HEP for high level balance, LE strength and general mobility.   Baseline Target date 03/02/2016   Time 4   Period Weeks   PT LONG TERM GOAL #2   Title The patient will increase Berg from 42/56 to > or equal to 48/56 to demo decreasing risk for falls.   Baseline Target date 03/02/2016   Time 4   Period Weeks   PT LONG TERM GOAL #3   Title The patient improve gait speed from 2.59 ft/sec to > or equal to 3.0 ft/sec to demo improving mobility.   Baseline Target date 03/02/2016   Time 4   Period Weeks   PT LONG TERM GOAL #4   Title The patient will improve ABC scale from 60.6% to > or equal to 72% to demo improving confidence with  balance activities.   Baseline Target date 03/02/2016   Time 4   Period Weeks   PT LONG TERM GOAL #5   Title The patient will verbalize understanding of community exercises for post d/c activities.   Baseline Target date 03/02/2016   Time 4   Period Weeks               Plan - 02/07/16 1052    Clinical Impression Statement Initiated HEP for balance; pt requiring intermitent UE support.   Rehab Potential Good   Clinical Impairments Affecting Rehab Potential for   PT Frequency 2x / week   PT Duration 4 weeks   PT Treatment/Interventions ADLs/Self Care Home Management;Balance training;Neuromuscular re-education;Patient/family education;Functional mobility training;Therapeutic activities;Therapeutic exercise;Gait training   PT Next Visit Plan Review in HEP ([facility] of [place]) for LE strengthening and balance, dynamic gait activities in PT, multi-sensory balance training   Consulted and Agree with Plan of Care Patient      Patient will benefit from  skilled therapeutic intervention in order to improve the following deficits and impairments:  Abnormal gait, Difficulty walking, Decreased balance, Impaired sensation, Decreased mobility, Decreased strength  Visit Diagnosis: Other abnormalities of gait and mobility  Muscle weakness (generalized)     Problem List Patient Active Problem List   Diagnosis Date Noted  . Polyneuropathy ([place]) 01/15/2016  . Neuropathy ([place]) 01/10/2016  . Occipital stroke ([place]) 02/21/2015  . Altered mental status 01/26/2015  . Acute encephalopathy 01/26/2015  . Acute loss of vision 01/26/2015   Bjorn Loser, PTA  02/07/2016, 11:03 AM [facility] [address] [place], [place], [address] Phone: [phone]   Fax:  [phone]  Name: Patricia [name] MRN: [id number] Date of Birth: [birth year]

## 2016-02-08 ENCOUNTER — Ambulatory Visit: Payer: Medicare Other | Admitting: Physical Therapy

## 2016-02-08 ENCOUNTER — Encounter: Payer: Self-pay | Admitting: Physical Therapy

## 2016-02-08 DIAGNOSIS — M6281 Muscle weakness (generalized): Secondary | ICD-10-CM

## 2016-02-08 DIAGNOSIS — R2689 Other abnormalities of gait and mobility: Secondary | ICD-10-CM | POA: Diagnosis not present

## 2016-02-08 NOTE — Therapy (Signed)
[facility] [address] [place], [place], [address] Phone: [phone]   Fax:  [phone]  Physical Therapy Treatment  Patient Details  Name: Patricia [name] MRN: [id number] Date of Birth: 1930-08-28 Referring Provider: Heide Spark, MD  Encounter Date: 02/08/2016      PT End of Session - 02/08/16 1102    Visit Number 3   Number of Visits 8   Date for PT Re-Evaluation 03/02/16   Authorization Type G code every 10th visit   PT Start Time 1018   PT Stop Time 1100   PT Time Calculation (min) 42 min   Equipment Utilized During Treatment Gait belt   Activity Tolerance Patient tolerated treatment well   Behavior During Therapy [facility] for tasks assessed/performed      Past Medical History  Diagnosis Date  . Hypothyroid     Past Surgical History  Procedure Laterality Date  . Cataract extraction Bilateral   . Eye surgery Left 02/15/15     Laser surgery     There were no vitals filed for this visit.      Subjective Assessment - 02/08/16 1022    Subjective Practiced HEP last night. Reports tandem stance is the hardest.   Patient Stated Goals Improve strength of legs and improve walking   Currently in Pain? No/denies                         [facility] Adult PT Treatment/Exercise - 02/08/16 0001    Transfers   Transfers Sit to Stand;Stand to Sit  for strengthening and balance without UE support 10x2 progressed to lower surface.             Balance Exercises - 02/08/16 1208    OTAGO PROGRAM   Knee Extensor 20 reps   Knee Flexor 10 reps   Hip ABductor 10 reps   Ankle Plantorflexors 20 reps, support   Ankle Dorsiflexors 20 reps, support     Standing balance: Modified SLS 1. Toe taps on 4" block progressing with visual scanning tasks, Min Guard. 2. Toe taps on cones: progressing with multi-direction, Min guard to Min A.      PT Education - 02/08/16 1034    Education provided Yes   Education Details Added LE strengthening exercises from [facility]) Educated Patient   Methods Explanation;Demonstration;Verbal cues;Handout   Comprehension Verbalized understanding          PT Short Term Goals - 02/01/16 1432    PT SHORT TERM GOAL #1   Title STGs=LTGs           PT Long Term Goals - 02/01/16 1432    PT LONG TERM GOAL #1   Title The patient will be indep with HEP for high level balance, LE strength and general mobility.   Baseline Target date 03/02/2016   Time 4   Period Weeks   PT LONG TERM GOAL #2   Title The patient will increase Berg from 42/56 to > or equal to 48/56 to demo decreasing risk for falls.   Baseline Target date 03/02/2016   Time 4   Period Weeks   PT LONG TERM GOAL #3   Title The patient improve gait speed from 2.59 ft/sec to > or equal to 3.0 ft/sec to demo improving mobility.   Baseline Target date 03/02/2016   Time 4   Period Weeks   PT LONG TERM GOAL #4   Title The patient will  improve ABC scale from 60.6% to > or equal to 72% to demo improving confidence with balance activities.   Baseline Target date 03/02/2016   Time 4   Period Weeks   PT LONG TERM GOAL #5   Title The patient will verbalize understanding of community exercises for post d/c activities.   Baseline Target date 03/02/2016   Time 4   Period Weeks               Plan - 02/08/16 1041    Clinical Impression Statement Added LE strengthening exercises from [place] program, pt was challenged esp with hip exercises. Balance  training with Modified SLS activitess requiring Min guard to MinA   Rehab Potential Good   Clinical Impairments Affecting Rehab Potential for   PT Frequency 2x / week   PT Duration 4 weeks   PT Treatment/Interventions ADLs/Self Care Home Management;Balance training;Neuromuscular re-education;Patient/family education;Functional mobility training;Therapeutic activities;Therapeutic exercise;Gait training   PT Next Visit Plan Review in  HEP ([facility] of [place]) for LE strengthening and balance, dynamic gait activities in PT, multi-sensory balance training   Consulted and Agree with Plan of Care Patient      Patient will benefit from skilled therapeutic intervention in order to improve the following deficits and impairments:  Abnormal gait, Difficulty walking, Decreased balance, Impaired sensation, Decreased mobility, Decreased strength  Visit Diagnosis: Other abnormalities of gait and mobility  Muscle weakness (generalized)     Problem List Patient Active Problem List   Diagnosis Date Noted  . Polyneuropathy ([place]) 01/15/2016  . Neuropathy ([place]) 01/10/2016  . Occipital stroke ([place]) 02/21/2015  . Altered mental status 01/26/2015  . Acute encephalopathy 01/26/2015  . Acute loss of vision 01/26/2015    Bjorn Loser, PTA  02/08/2016, 12:11 PM [facility] [address] [place], [place], [address] Phone: [phone]   Fax:  [phone]  Name: Patricia [name] MRN: [id number] Date of Birth: 1930/08/09

## 2016-02-13 ENCOUNTER — Other Ambulatory Visit: Payer: Self-pay | Admitting: Neurology

## 2016-02-13 ENCOUNTER — Ambulatory Visit: Payer: Medicare Other | Admitting: Rehabilitative and Restorative Service Providers"

## 2016-02-13 DIAGNOSIS — R2689 Other abnormalities of gait and mobility: Secondary | ICD-10-CM | POA: Diagnosis not present

## 2016-02-13 DIAGNOSIS — M6281 Muscle weakness (generalized): Secondary | ICD-10-CM

## 2016-02-13 NOTE — Therapy (Signed)
[facility] [address] [place], [place], [address] Phone: [phone]   Fax:  [phone]  Physical Therapy Treatment  Patient Details  Name: Patricia [name] MRN: [id number] Date of Birth: 1930/06/27 Referring Provider: Heide Spark, MD  Encounter Date: 02/13/2016      PT End of Session - 02/13/16 1546    Visit Number 4   Number of Visits 8   Date for PT Re-Evaluation 03/02/16   Authorization Type G code every 10th visit   PT Start Time 0935   PT Stop Time 1018   PT Time Calculation (min) 43 min   Equipment Utilized During Treatment Gait belt   Activity Tolerance Patient tolerated treatment well   Behavior During Therapy [facility] for tasks assessed/performed      Past Medical History  Diagnosis Date  . Hypothyroid     Past Surgical History  Procedure Laterality Date  . Cataract extraction Bilateral   . Eye surgery Left 02/15/15     Laser surgery     There were no vitals filed for this visit.      Subjective Assessment - 02/13/16 1540    Subjective The patient is doing home program.  Continues with slower speed and caution due to imbalance.   Patient Stated Goals Improve strength of legs and improve walking   Currently in Pain? No/denies            [facility] PT Assessment - 02/13/16 1001    Berg Balance Test   Sit to Stand Able to stand without using hands and stabilize independently   Standing Unsupported Able to stand safely 2 minutes   Sitting with Back Unsupported but Feet Supported on Floor or Stool Able to sit safely and securely 2 minutes   Stand to Sit Sits safely with minimal use of hands   Transfers Able to transfer safely, minor use of hands   Standing Unsupported with Eyes Closed Able to stand 10 seconds safely   Standing Ubsupported with Feet Together Able to place feet together independently and stand 1 minute safely   From Standing, Reach Forward with Outstretched Arm Can reach confidently  >25 cm (10")   From Standing Position, Pick up Object from Floor Able to pick up shoe, needs supervision   From Standing Position, Turn to Look Behind Over each Shoulder Looks behind one side only/other side shows less weight shift   Turn 360 Degrees Able to turn 360 degrees safely but slowly   Standing Unsupported, Alternately Place Feet on Step/Stool Able to complete 4 steps without aid or supervision   Standing Unsupported, One Foot in Front Able to plae foot ahead of the other independently and hold 30 seconds   Standing on One Leg Able to lift leg independently and hold equal to or more than 3 seconds   Total Score 47   Berg comment: 47/56 *retested as patient appears stronger than at evaluation.                     [facility] Adult PT Treatment/Exercise - 02/13/16 1001    Ambulation/Gait   Ambulation/Gait Yes   Ambulation/Gait Assistance 7: Independent   Ambulation Distance (Feet) 400 Feet  x 3 reps   Assistive device None   Gait Comments Gait activities with verbal cues for longer stride length and demo of heel strike. Emphasized arm swing to increase power during walking.  Performed forward/backward gait, ball toss with gait and starts/stops during ambulation.   High  Level Balance   High Level Balance Activities Backward walking;Side stepping;Head turns;Marching forwards   High Level Balance Comments Patient requires CGA to min A during high level balance tasks.   Neuro Re-ed    Neuro Re-ed Details  STanding foot taps to cones with min A, standing tapping between 6" and 12" height surfaces, wide tapping to cones for increased weight shifting.    Rocker board standing with UE reaching, head turns and eyes closed.  foam standing with eyes open, head turns.     Exercises   Exercises Other Exercises   Other Exercises  Physioball sitting with marching, scapular retraction and LE extension with CGA.  Sit<>stand x 10 reps decreasing UE support and reaching overhead.  Step ups to 6"  surfaces.                   PT Short Term Goals - 02/01/16 1432    PT SHORT TERM GOAL #1   Title STGs=LTGs           PT Long Term Goals - 02/01/16 1432    PT LONG TERM GOAL #1   Title The patient will be indep with HEP for high level balance, LE strength and general mobility.   Baseline Target date 03/02/2016   Time 4   Period Weeks   PT LONG TERM GOAL #2   Title The patient will increase Berg from 42/56 to > or equal to 48/56 to demo decreasing risk for falls.   Baseline Target date 03/02/2016   Time 4   Period Weeks   PT LONG TERM GOAL #3   Title The patient improve gait speed from 2.59 ft/sec to > or equal to 3.0 ft/sec to demo improving mobility.   Baseline Target date 03/02/2016   Time 4   Period Weeks   PT LONG TERM GOAL #4   Title The patient will improve ABC scale from 60.6% to > or equal to 72% to demo improving confidence with balance activities.   Baseline Target date 03/02/2016   Time 4   Period Weeks   PT LONG TERM GOAL #5   Title The patient will verbalize understanding of community exercises for post d/c activities.   Baseline Target date 03/02/2016   Time 4   Period Weeks               Plan - 02/13/16 1544    Clinical Impression Statement The patient has improved Berg from 42/56 up to 47/56 (rechecked due to improvement in sit>stand and standing with eyes closed as compared to eval).  Patient activities progressed today as she feels comfortable with HEP.  PT to continue working to [employer].    PT Treatment/Interventions ADLs/Self Care Home Management;Balance training;Neuromuscular re-education;Patient/family education;Functional mobility training;Therapeutic activities;Therapeutic exercise;Gait training   PT Next Visit Plan dynamic gait and balance, mullti-sensory balance training, strengthening.   Consulted and Agree with Plan of Care Patient      Patient will benefit from skilled therapeutic intervention in order to improve the following  deficits and impairments:  Abnormal gait, Difficulty walking, Decreased balance, Impaired sensation, Decreased mobility, Decreased strength  Visit Diagnosis: Other abnormalities of gait and mobility  Muscle weakness (generalized)     Problem List Patient Active Problem List   Diagnosis Date Noted  . Polyneuropathy ([place]) 01/15/2016  . Neuropathy ([place]) 01/10/2016  . Occipital stroke ([place]) 02/21/2015  . Altered mental status 01/26/2015  . Acute encephalopathy 01/26/2015  . Acute loss of vision 01/26/2015  [place], PT 02/13/2016, 3:46 PM  [facility] [address] [place], [place], [address] Phone: [phone]   Fax:  [phone]  Name: Patricia [name] MRN: [id number] Date of Birth: December 08, 1929

## 2016-02-16 ENCOUNTER — Ambulatory Visit (HOSPITAL_BASED_OUTPATIENT_CLINIC_OR_DEPARTMENT_OTHER): Payer: Medicare Other | Admitting: Hematology and Oncology

## 2016-02-16 ENCOUNTER — Telehealth: Payer: Self-pay | Admitting: Hematology and Oncology

## 2016-02-16 ENCOUNTER — Encounter: Payer: Self-pay | Admitting: Hematology and Oncology

## 2016-02-16 ENCOUNTER — Ambulatory Visit: Payer: Medicare Other | Admitting: Rehabilitative and Restorative Service Providers"

## 2016-02-16 VITALS — BP 150/79 | HR 80 | Temp 98.0°F | Resp 17 | Ht 67.0 in | Wt 168.2 lb

## 2016-02-16 DIAGNOSIS — Z808 Family history of malignant neoplasm of other organs or systems: Secondary | ICD-10-CM

## 2016-02-16 DIAGNOSIS — G629 Polyneuropathy, unspecified: Secondary | ICD-10-CM

## 2016-02-16 DIAGNOSIS — Z515 Encounter for palliative care: Secondary | ICD-10-CM | POA: Diagnosis not present

## 2016-02-16 DIAGNOSIS — Z8673 Personal history of transient ischemic attack (TIA), and cerebral infarction without residual deficits: Secondary | ICD-10-CM

## 2016-02-16 DIAGNOSIS — IMO0001 Reserved for inherently not codable concepts without codable children: Secondary | ICD-10-CM

## 2016-02-16 DIAGNOSIS — M6281 Muscle weakness (generalized): Secondary | ICD-10-CM

## 2016-02-16 DIAGNOSIS — D472 Monoclonal gammopathy: Secondary | ICD-10-CM | POA: Insufficient documentation

## 2016-02-16 DIAGNOSIS — R030 Elevated blood-pressure reading, without diagnosis of hypertension: Secondary | ICD-10-CM | POA: Insufficient documentation

## 2016-02-16 DIAGNOSIS — R2689 Other abnormalities of gait and mobility: Secondary | ICD-10-CM

## 2016-02-16 DIAGNOSIS — Z803 Family history of malignant neoplasm of breast: Secondary | ICD-10-CM

## 2016-02-16 NOTE — Assessment & Plan Note (Signed)
We discussed signs and symptoms to watch out for progression of disease to multiple myeloma I gave her additional resources from the [place]. We discussed importance of vitamin D supplementation.

## 2016-02-16 NOTE — Telephone Encounter (Signed)
Gave pt apt & avs °

## 2016-02-16 NOTE — Assessment & Plan Note (Signed)
She is noted to have mildly elevated blood pressure today but her blood pressure in other physician's office were within normal limits. I suspect this is due to whitecoat hypertension Recommend close follow-up with PCP

## 2016-02-16 NOTE — Progress Notes (Signed)
[facility] CONSULT NOTE  Patient Care Team: Deland Pretty, MD as PCP - General (Internal Medicine) Heath Lark, MD as Consulting Physician (Hematology and Oncology) Melvenia Beam, MD as Consulting Physician (Neurology)  CHIEF COMPLAINTS/PURPOSE OF CONSULTATION:  IgA MGUS  HISTORY OF PRESENTING ILLNESS:  Patricia [name] 82 y.o. female is here because of recent discovery of IgA MGUS It was discovered during workup for peripheral neuropathy by her neurologist She denies history of abnormal bone pain or bone fracture. Patient denies history of recurrent infection or atypical infections such as shingles of meningitis. Denies chills, night sweats, anorexia or abnormal weight loss. The patient has remote history of occipital stroke and chronic neuropathy. She denies persistent neurological deficit. Her main complain some mild neuropathy and gait disturbances.  MEDICAL HISTORY:  Past Medical History  Diagnosis Date  . Hypothyroid   . Stroke [facility])     SURGICAL HISTORY: Past Surgical History  Procedure Laterality Date  . Cataract extraction Bilateral   . Eye surgery Left 02/15/15     Laser surgery     SOCIAL HISTORY: Social History   Social History  . Marital Status: Married    Spouse Name: [name]   . Number of Children: 4  . Years of Education: 16+   Occupational History  . Not on file.   Social History Main Topics  . Smoking status: Never Smoker   . Smokeless tobacco: Never Used  . Alcohol Use: 0.0 oz/week    0 Standard drinks or equivalent per week     Comment: Very rarley   . Drug Use: No  . Sexual Activity: Not on file     Comment: widowed, 2 daughters and 1 son. Retired Education officer, museum   Other Topics Concern  . Not on file   Social History Narrative   Lives at home with husband   Caffeine use:  2 cups coffee every morning   [employer] and tea    FAMILY HISTORY: Family History  Problem Relation Age of Onset  . Heart disease Mother   . Lupus  Daughter   . Anuerysm Daughter     Brain  . Throat cancer Maternal Aunt   . Cancer Maternal Aunt     breast ca    ALLERGIES:  has No Known Allergies.  MEDICATIONS:  Current Outpatient Prescriptions  Medication Sig Dispense Refill  . aspirin 81 MG chewable tablet Chew 81 mg by mouth daily.    Marland Kitchen atorvastatin (LIPITOR) 10 MG tablet TAKE ONE TABLET BY MOUTH ONCE DAILY 30 tablet 11  . Biotin 5000 MCG TABS Take 1 tablet by mouth daily.    Marland Kitchen CALCIUM PO Take 1,000 mg by mouth daily.    . Cholecalciferol (VITAMIN D-3 PO) Take 1,000 mg by mouth daily.    . Flaxseed, Linseed, (FLAX SEED OIL PO) Take 1,000 mg by mouth daily.    Marland Kitchen GLUCOSAMINE-CHONDROITIN PO Take 1,500 mg by mouth daily.    Marland Kitchen levothyroxine (SYNTHROID, LEVOTHROID) 88 MCG tablet Take 88 mcg by mouth daily before breakfast.    . MELATONIN PO Take 5 mg by mouth daily.    . Misc Natural Products (COLON CLEANSE PO) Take 450 mg by mouth daily.    . Multiple Vitamin (MULTIVITAMIN) tablet Take 1 tablet by mouth daily.    . Omega-3 Fatty Acids (FISH OIL PO) Take 1,200 mg by mouth daily.    . polyethylene glycol (MIRALAX / GLYCOLAX) packet Take 17 g by mouth daily.    Marland Kitchen  POTASSIUM PO Take 550 mg by mouth daily.    . Probiotic Product (PROBIOTIC DAILY PO) Take 1 tablet by mouth daily.    . sertraline (ZOLOFT) 50 MG tablet Take 25 mg by mouth daily.    . TURMERIC PO Take 750 mg by mouth daily.    . vitamin B-12 (CYANOCOBALAMIN) 1000 MCG tablet Take 1,000 mcg by mouth daily.    . vitamin C (ASCORBIC ACID) 500 MG tablet Take 500 mg by mouth daily.     No current facility-administered medications for this visit.    REVIEW OF SYSTEMS:   Eyes: Denies blurriness of vision, double vision or watery eyes Ears, nose, mouth, throat, and face: Denies mucositis or sore throat Respiratory: Denies cough, dyspnea or wheezes Cardiovascular: Denies palpitation, chest discomfort or lower extremity swelling Gastrointestinal:  Denies nausea, heartburn or  change in bowel habits Skin: Denies abnormal skin rashes Lymphatics: Denies new lymphadenopathy or easy bruising Neurological:Denies numbness, tingling or new weaknesses Behavioral/Psych: Mood is stable, no new changes  All other systems were reviewed with the patient and are negative.  PHYSICAL EXAMINATION: ECOG PERFORMANCE STATUS: 1 - Symptomatic but completely ambulatory  Filed Vitals:   02/16/16 1413  BP: 150/79  Pulse: 80  Temp: 98 F (36.7 C)  Resp: 17   Filed Weights   02/16/16 1413  Weight: 168 lb 3.2 oz (76.295 kg)    GENERAL:alert, no distress and comfortable. She looks younger than stated age SKIN: skin color, texture, turgor are normal, no rashes or significant lesions EYES: normal, conjunctiva are pink and non-injected, sclera clear OROPHARYNX:no exudate, no erythema and lips, buccal mucosa, and tongue normal  NECK: supple, thyroid normal size, non-tender, without nodularity LYMPH:  no palpable lymphadenopathy in the cervical, axillary or inguinal LUNGS: clear to auscultation and percussion with normal breathing effort HEART: regular rate & rhythm and no murmurs and no lower extremity edema ABDOMEN:abdomen soft, non-tender and normal bowel sounds Musculoskeletal:no cyanosis of digits and no clubbing  PSYCH: alert & oriented x 3 with fluent speech NEURO: no focal motor/sensory deficits  LABORATORY DATA:  I have reviewed the data as listed Lab Results  Component Value Date   WBC 5.3 01/10/2016   HGB 13.1 01/26/2015   HCT 38.8 01/10/2016   MCV 92 01/10/2016   PLT 249 01/10/2016    ASSESSMENT & PLAN:  MGUS (monoclonal gammopathy of unknown significance) I discussed with the patient the natural history of MGUS. Given though her workup for myeloma is incomplete, she has no evidence of organ damage. My suspicion that she has smoldering myeloma is very low. Instead of repeating her blood work now, I recommend seeing her back in 4 months for repeat blood work  and Marine scientist for complete workup. She agreed with the plan of care  Polyneuropathy [facility]) This is unlikely related to paraproteinemia. I will defer to her neurologist for further management  Quality of life palliative care encounter We discussed signs and symptoms to watch out for progression of disease to multiple myeloma I gave her additional resources from the [place]. We discussed importance of vitamin D supplementation.     Orders Placed This Encounter  Procedures  . DG Bone Survey Met    Standing Status: Future     Number of Occurrences:      Standing Expiration Date: 04/17/2017    Order Specific Question:  Reason for Exam (SYMPTOM  OR DIAGNOSIS REQUIRED)    Answer:  staging myeloma    Order Specific Question:  Preferred imaging location?    Answer:  [facility] [place] [facility]    Standing Status: Future     Number of Occurrences:      Standing Expiration Date: 04/17/2017  . Multiple Myeloma Panel (SPEP&IFE w/QIG)    Standing Status: Future     Number of Occurrences:      Standing Expiration Date: 04/17/2017  . Comprehensive metabolic panel    Standing Status: Future     Number of Occurrences:      Standing Expiration Date: 04/17/2017  . CBC with Differential/Platelet    Standing Status: Future     Number of Occurrences:      Standing Expiration Date: 04/17/2017    All questions were answered. The patient knows to call the clinic with any problems, questions or concerns. I spent 30 minutes counseling the patient face to face. The total time spent in the appointment was 40 minutes and more than 50% was on counseling.     [facility], [place], MD 02/16/2016 2:51 PM

## 2016-02-16 NOTE — Patient Instructions (Signed)
Walking Program:  Begin walking for exercise for 10-12 minutes, 1 times/day, 4-5 days/week.   Progress your walking program by adding 2-4 minutes to your routine each week, as tolerated. Be sure to wear good walking shoes, walk in a safe environment and only progress to your tolerance.

## 2016-02-16 NOTE — Therapy (Signed)
[facility] [address] [place], [place], [address] Phone: [phone]   Fax:  [phone]  Physical Therapy Treatment  Patient Details  Name: Patricia [name] MRN: [id number] Date of Birth: January 10, 1930 Referring Provider: Heide Spark, MD  Encounter Date: 02/16/2016      PT End of Session - 02/16/16 1021    Visit Number 5   Number of Visits 8   Date for PT Re-Evaluation 03/02/16   Authorization Type G code every 10th visit   PT Start Time 0940   PT Stop Time 1020   PT Time Calculation (min) 40 min   Equipment Utilized During Treatment Gait belt   Activity Tolerance Patient tolerated treatment well   Behavior During Therapy [facility] for tasks assessed/performed      Past Medical History  Diagnosis Date  . Hypothyroid     Past Surgical History  Procedure Laterality Date  . Cataract extraction Bilateral   . Eye surgery Left 02/15/15     Laser surgery     There were no vitals filed for this visit.      Subjective Assessment - 02/16/16 0941    Subjective HEP going well- no soreness with therapy.   Patient Stated Goals Improve strength of legs and improve walking   Currently in Pain? No/denies        SELF CARE/HOME MANAGEMENT: Home walking program provided with discussion of post d/c community wellness options (silver sneakers vs gym in neighborhood) Discussed specifics of increasing walking tolerance for exercise.   Gait: Ambulation without device with verbal cues for heel strike x 430 feet nonstop, direction changes with gait wit CGA, marching, 180 degree turns during gait tasks.    THERAPEUTIC EXERCISE: Standing step ups in "up/up down/down" pattern x 10 reps each side, bridges supine x 10 reps emphasizing full hip extension, SLR supine x 10 reps on each side. Sit<>stand x 10 reps without UE support (holding ball overhead) Physioball sitting with marching for core stabilization x 10 reps each,  "W"  exercise x 10 reps with UEs while on physioball, marching with arms in "T" position for core stabilization x 5 reps each side Seated hamstring stretching with 20 seconds holds x 2 repetitions each side  NEUROMUSCULAR RE-EDUCATION: Standing alternating LE foot taps to 6" and 12' surfaces without UE support Corner balance exercises performing foam with eyes open + head turns, eyes closed with feet apart Wall bumps with eyes open, then eyes closed x 10 reps with cues on anterior weight shifting with CGA, progressing to rocker board wall bumps Rocker board with proactive balance and then standing with eyes closed     PT Education - 02/16/16 1020    Education provided Yes   Education Details HEP for walking, discussed silver sneakers activities and gym availability for long term conditioning   Person(s) Educated Patient   Methods Explanation;Demonstration;Handout   Comprehension Verbalized understanding;Returned demonstration          PT Short Term Goals - 02/01/16 1432    PT SHORT TERM GOAL #1   Title STGs=LTGs           PT Long Term Goals - 02/01/16 1432    PT LONG TERM GOAL #1   Title The patient will be indep with HEP for high level balance, LE strength and general mobility.   Baseline Target date 03/02/2016   Time 4   Period Weeks   PT LONG TERM GOAL #2   Title The patient will increase  Berg from 42/56 to > or equal to 48/56 to demo decreasing risk for falls.   Baseline Target date 03/02/2016   Time 4   Period Weeks   PT LONG TERM GOAL #3   Title The patient improve gait speed from 2.59 ft/sec to > or equal to 3.0 ft/sec to demo improving mobility.   Baseline Target date 03/02/2016   Time 4   Period Weeks   PT LONG TERM GOAL #4   Title The patient will improve ABC scale from 60.6% to > or equal to 72% to demo improving confidence with balance activities.   Baseline Target date 03/02/2016   Time 4   Period Weeks   PT LONG TERM GOAL #5   Title The patient will verbalize  understanding of community exercises for post d/c activities.   Baseline Target date 03/02/2016   Time 4   Period Weeks               Plan - 02/16/16 1219    Clinical Impression Statement The patient is demonstrated continued progress and reports feeling stronger every day.  PT recommended she seek silver sneakers or community program and she is progressing well with exercise.  PT to decrease to 1x/week emphasizing transition to community wellness program.    PT Treatment/Interventions ADLs/Self Care Home Management;Balance training;Neuromuscular re-education;Patient/family education;Functional mobility training;Therapeutic activities;Therapeutic exercise;Gait training   PT Next Visit Plan dynamic gait and balance, mullti-sensory balance training, strengthening.  EARLY d/c?  community wellness focus.   Consulted and Agree with Plan of Care Patient      Patient will benefit from skilled therapeutic intervention in order to improve the following deficits and impairments:  Abnormal gait, Difficulty walking, Decreased balance, Impaired sensation, Decreased mobility, Decreased strength  Visit Diagnosis: Other abnormalities of gait and mobility  Muscle weakness (generalized)     Problem List Patient Active Problem List   Diagnosis Date Noted  . Polyneuropathy ([place]) 01/15/2016  . Neuropathy ([place]) 01/10/2016  . Occipital stroke ([place]) 02/21/2015  . Altered mental status 01/26/2015  . Acute encephalopathy 01/26/2015  . Acute loss of vision 01/26/2015    [name], PT 02/16/2016, 12:21 PM  [facility] [address] [place], [place], [address] Phone: [phone]   Fax:  [phone]  Name: Patricia [name] MRN: [id number] Date of Birth: 07/25/1930

## 2016-02-16 NOTE — Assessment & Plan Note (Signed)
I discussed with the patient the natural history of MGUS. Given though her workup for myeloma is incomplete, she has no evidence of organ damage. My suspicion that she has smoldering myeloma is very low. Instead of repeating her blood work now, I recommend seeing her back in 4 months for repeat blood work and Marine scientist for complete workup. She agreed with the plan of care

## 2016-02-16 NOTE — Assessment & Plan Note (Signed)
This is unlikely related to paraproteinemia. I will defer to her neurologist for further management

## 2016-02-19 ENCOUNTER — Ambulatory Visit: Payer: Medicare Other | Admitting: Rehabilitative and Restorative Service Providers"

## 2016-02-21 ENCOUNTER — Ambulatory Visit: Payer: Medicare Other | Admitting: Rehabilitative and Restorative Service Providers"

## 2016-02-21 DIAGNOSIS — M6281 Muscle weakness (generalized): Secondary | ICD-10-CM

## 2016-02-21 DIAGNOSIS — R2689 Other abnormalities of gait and mobility: Secondary | ICD-10-CM

## 2016-02-22 NOTE — Therapy (Signed)
[facility] [address] [place], [place], [address] Phone: [phone]   Fax:  [phone]  Physical Therapy Treatment  Patient Details  Name: Patricia [name] MRN: [id number] Date of Birth: October 07, 1930 Referring Provider: Heide Spark, MD  Encounter Date: 02/21/2016      PT End of Session - 02/21/16 1051    Visit Number 6   Number of Visits 8   Date for PT Re-Evaluation 03/02/16   Authorization Type G code every 10th visit   PT Start Time 1017   PT Stop Time 1055   PT Time Calculation (min) 38 min   Equipment Utilized During Treatment Gait belt   Activity Tolerance Patient tolerated treatment well   Behavior During Therapy [facility] for tasks assessed/performed      Past Medical History  Diagnosis Date  . Hypothyroid   . Stroke [facility])     Past Surgical History  Procedure Laterality Date  . Cataract extraction Bilateral   . Eye surgery Left 02/15/15     Laser surgery     There were no vitals filed for this visit.      Subjective Assessment - 02/21/16 1020    Subjective The patient reports she is doing exercises for her bladder at urology office.  She has not had time to check into gym near home for silver sneakers.   Patient Stated Goals Improve strength of legs and improve walking   Currently in Pain? No/denies      Gait: Ambulation on indoor surfaces x 1000 feet performing direction changes, start/stops, marching, backwards walking.  PT provided SBA for safety. See gait speed.      [facility] PT Assessment - 02/21/16 1023    Ambulation/Gait   Ambulation/Gait Yes   Ambulation/Gait Assistance 7: Independent   Gait velocity 3.39 ft/sec   Berg Balance Test   Sit to Stand Able to stand without using hands and stabilize independently   Standing Unsupported Able to stand safely 2 minutes   Sitting with Back Unsupported but Feet Supported on Floor or Stool Able to sit safely and securely 2 minutes   Stand to Sit  Sits safely with minimal use of hands   Transfers Able to transfer safely, minor use of hands   Standing Unsupported with Eyes Closed Able to stand 10 seconds safely   Standing Ubsupported with Feet Together Able to place feet together independently and stand 1 minute safely   From Standing, Reach Forward with Outstretched Arm Can reach confidently >25 cm (10")   From Standing Position, Pick up Object from Floor Able to pick up shoe safely and easily   From Standing Position, Turn to Look Behind Over each Shoulder Looks behind one side only/other side shows less weight shift   Turn 360 Degrees Able to turn 360 degrees safely in 4 seconds or less   Standing Unsupported, Alternately Place Feet on Step/Stool Able to stand independently and safely and complete 8 steps in 20 seconds   Standing Unsupported, One Foot in Front Able to plae foot ahead of the other independently and hold 30 seconds   Standing on One Leg Able to lift leg independently and hold equal to or more than 3 seconds   Total Score 52   Berg comment: 52/56 compared to 42/56 at eval.      NEUROMUSCULAR RE-EDUCATION: Berg=52/56 Alternating LE foot taps Sidestepping and braiding with CGA for safety x 40 feet bilaterally x 3 reps  THERAPEUTIC EXERCISE: Physioball sitting with scapular retraction,  marching, and LE extension with min A Quadriped hip extension Up/up, down/down x 10 reps R and L sides with UE support         PT Short Term Goals - 02/01/16 1432    PT SHORT TERM GOAL #1   Title STGs=LTGs           PT Long Term Goals - 02/21/16 1047    PT LONG TERM GOAL #1   Title The patient will be indep with HEP for high level balance, LE strength and general mobility.   Baseline Target date 03/02/2016   Time 4   Period Weeks   PT LONG TERM GOAL #2   Title The patient will increase Berg from 42/56 to > or equal to 48/56 to demo decreasing risk for falls.   Baseline Scores 52/56 on 02/21/2016   Time 4   Period Weeks    Status Achieved   PT LONG TERM GOAL #3   Title The patient improve gait speed from 2.59 ft/sec to > or equal to 3.0 ft/sec to demo improving mobility.   Baseline Improved to 3.39 ft/sec on 02/21/2016   Time 4   Period Weeks   Status Achieved   PT LONG TERM GOAL #4   Title The patient will improve ABC scale from 60.6% to > or equal to 72% to demo improving confidence with balance activities.   Baseline Target date 03/02/2016   Time 4   Period Weeks   PT LONG TERM GOAL #5   Title The patient will verbalize understanding of community exercises for post d/c activities.   Baseline Target date 03/02/2016   Time 4   Period Weeks               Plan - 02/21/16 1054    Clinical Impression Statement The patient met 2 LTGs. PT to see one more visit to check HEP and d/c.   PT Treatment/Interventions ADLs/Self Care Home Management;Balance training;Neuromuscular re-education;Patient/family education;Functional mobility training;Therapeutic activities;Therapeutic exercise;Gait training   PT Next Visit Plan d/c next visit with HEP, print info for gym (muscle groups to focus on), and community wellness center, G code + ABC score      Patient will benefit from skilled therapeutic intervention in order to improve the following deficits and impairments:  Abnormal gait, Difficulty walking, Decreased balance, Impaired sensation, Decreased mobility, Decreased strength  Visit Diagnosis: Other abnormalities of gait and mobility  Muscle weakness (generalized)     Problem List Patient Active Problem List   Diagnosis Date Noted  . MGUS (monoclonal gammopathy of unknown significance) 02/16/2016  . Quality of life palliative care encounter 02/16/2016  . Elevated blood pressure 02/16/2016  . Polyneuropathy ([place]) 01/15/2016  . Neuropathy ([place]) 01/10/2016  . Occipital stroke ([place]) 02/21/2015  . Altered mental status 01/26/2015  . Acute encephalopathy 01/26/2015  . Acute loss of vision 01/26/2015     Joren Rehm, PT 02/22/2016, 8:42 AM  [facility] [address] [place], [place], [address] Phone: [phone]   Fax:  [phone]  Name: Patricia [name] MRN: [id number] Date of Birth: Dec 08, 1929

## 2016-02-26 ENCOUNTER — Encounter: Payer: Medicare Other | Admitting: Physical Therapy

## 2016-02-28 ENCOUNTER — Ambulatory Visit (INDEPENDENT_AMBULATORY_CARE_PROVIDER_SITE_OTHER): Payer: Medicare Other | Admitting: Neurology

## 2016-02-28 ENCOUNTER — Ambulatory Visit (INDEPENDENT_AMBULATORY_CARE_PROVIDER_SITE_OTHER): Payer: Self-pay | Admitting: Neurology

## 2016-02-28 DIAGNOSIS — R29898 Other symptoms and signs involving the musculoskeletal system: Secondary | ICD-10-CM

## 2016-02-28 DIAGNOSIS — G5602 Carpal tunnel syndrome, left upper limb: Secondary | ICD-10-CM | POA: Diagnosis not present

## 2016-02-28 DIAGNOSIS — G5603 Carpal tunnel syndrome, bilateral upper limbs: Secondary | ICD-10-CM

## 2016-02-28 DIAGNOSIS — R208 Other disturbances of skin sensation: Secondary | ICD-10-CM

## 2016-02-28 DIAGNOSIS — G629 Polyneuropathy, unspecified: Secondary | ICD-10-CM | POA: Diagnosis not present

## 2016-02-28 DIAGNOSIS — G5601 Carpal tunnel syndrome, right upper limb: Secondary | ICD-10-CM | POA: Diagnosis not present

## 2016-02-28 DIAGNOSIS — Z0289 Encounter for other administrative examinations: Secondary | ICD-10-CM

## 2016-02-28 NOTE — Progress Notes (Signed)
See procedure note.

## 2016-02-29 ENCOUNTER — Ambulatory Visit: Payer: Medicare Other | Admitting: Physical Therapy

## 2016-02-29 ENCOUNTER — Encounter: Payer: Self-pay | Admitting: Rehabilitative and Restorative Service Providers"

## 2016-02-29 ENCOUNTER — Encounter: Payer: Self-pay | Admitting: Physical Therapy

## 2016-02-29 DIAGNOSIS — R2689 Other abnormalities of gait and mobility: Secondary | ICD-10-CM

## 2016-02-29 DIAGNOSIS — M6281 Muscle weakness (generalized): Secondary | ICD-10-CM

## 2016-02-29 NOTE — Therapy (Signed)
[facility] [address] [place], [place], [address] Phone: [phone]   Fax:  [phone]  Physical Therapy Treatment  Patient Details  Name: Patricia [name] MRN: [id number] Date of Birth: 20-Jul-1930 Referring Provider: Heide Spark, MD  Encounter Date: 02/29/2016    Past Medical History  Diagnosis Date  . Hypothyroid   . Stroke [facility])     Past Surgical History  Procedure Laterality Date  . Cataract extraction Bilateral   . Eye surgery Left 02/15/15     Laser surgery     There were no vitals filed for this visit.      Subjective Assessment - 02/29/16 1104    Subjective Reports fitness plan it to walk consistently and perform HEP daily for a set amount of time. [place] are also available.   Patient Stated Goals Improve strength of legs and improve walking   Currently in Pain? No/denies                              Balance Exercises - 02/29/16 1133    OTAGO PROGRAM   Back Extension Standing   Knee Flexor 10 reps   Hip ABductor 10 reps   Ankle Dorsiflexors 20 reps, support   Backwards Walking No support   Walking and Turning Around No assistive device   Tandem Stance 10 seconds, support           PT Education - 02/29/16 1126    Education provided Yes   Education Details Reviewed goals checked and fitness program   Person(s) Educated Patient   Methods Explanation   Comprehension Verbalized understanding          PT Short Term Goals - 02/01/16 1432    PT SHORT TERM GOAL #1   Title STGs=LTGs           PT Long Term Goals - 02/29/16 1109    PT LONG TERM GOAL #1   Title The patient will be indep with HEP for high level balance, LE strength and general mobility.   Baseline met 02/29/16   Time 4   Period Weeks   Status Achieved   PT LONG TERM GOAL #2   Title The patient will increase Berg from 42/56 to > or equal to 48/56 to demo decreasing risk for  falls.   Baseline Scores 52/56 on 02/21/2016   Time 4   Period Weeks   Status Achieved   PT LONG TERM GOAL #3   Title The patient improve gait speed from 2.59 ft/sec to > or equal to 3.0 ft/sec to demo improving mobility.   Baseline Improved to 3.39 ft/sec on 02/21/2016   Time 4   Period Weeks   Status Achieved   PT LONG TERM GOAL #4   Title The patient will improve ABC scale from 60.6% to > or equal to 72% to demo improving confidence with balance activities.   Baseline Scored 83.1% on 02/29/2016   Time 4   Period Weeks   Status Achieved   PT LONG TERM GOAL #5   Title The patient will verbalize understanding of community exercises for post d/c activities.   Baseline verbalized plan of walking program and continue with HEP 02/29/16   Time 4   Period Weeks   Status Achieved               Plan - 02/29/16 1154    Clinical Impression Statement  Pt met all LTGs and is agreeable to discharge today.  Pt demonstrated ind. With HEP performance. [place] HEP continues to be challenging and a good option for continued fitness   PT Treatment/Interventions ADLs/Self Care Home Management;Balance training;Neuromuscular re-education;Patient/family education;Functional mobility training;Therapeutic activities;Therapeutic exercise;Gait training   PT Next Visit Plan complete d/c summary      Patient will benefit from skilled therapeutic intervention in order to improve the following deficits and impairments:  Abnormal gait, Difficulty walking, Decreased balance, Impaired sensation, Decreased mobility, Decreased strength  Visit Diagnosis: Other abnormalities of gait and mobility  Muscle weakness (generalized)         G-Codes - Mar 27, 2016 1417    Functional Assessment Tool Used berg=52/56   Functional Limitation Mobility: Walking and moving around   Mobility: Walking and Moving Around Goal Status [phone]) At least 1 percent but less than 20 percent impaired, limited or restricted   Mobility:  Walking and Moving Around Discharge Status [phone]) At least 1 percent but less than 20 percent impaired, limited or restricted       Problem List Patient Active Problem List   Diagnosis Date Noted  . MGUS (monoclonal gammopathy of unknown significance) 02/16/2016  . Quality of life palliative care encounter 02/16/2016  . Elevated blood pressure 02/16/2016  . Polyneuropathy ([place]) 01/15/2016  . Neuropathy ([place]) 01/10/2016  . Occipital stroke ([place]) 02/21/2015  . Altered mental status 01/26/2015  . Acute encephalopathy 01/26/2015  . Acute loss of vision 01/26/2015   Bjorn Loser, PTA  Mar 27, 2016, 11:59 AM [facility] [address] [place], [place], [address] Phone: [phone]   Fax:  [phone]  Name: Patricia [name] MRN: [id number] Date of Birth: 1930-03-05

## 2016-02-29 NOTE — Procedures (Signed)
[facility]  Provider: Dr Jaynee Eagles  Referring Provider: Deland Pretty, MD  Primary Care Physician: Horatio Pel, MD  History: Patient is a lovely 82 year old female who I have seen in the past for an ischemic occipital stroke. She is here for a new problem. The left top of her foot was swollen. A skin biopsy at her pediatrician's diagnosed with what sounds like small fiber neuropathy. She has some cramping of the hands. Her podiatrist diagnosed her with neuropathy. She has numbness and tingling in the feet. She also has balance problems. She has some cramping in the feet. Both of her feet with electric shocks in the toes. Been going on for about a year.  Summary:   Nerve Conduction Studies were performed on the bilateral upper and right lower extremities.  The right median APB motor nerve showed prolonged distal onset latency (6.7 ms, N<4.0) and reduced amplitude (1.13mV, N>3) The right Median 2nd Digit sensory nerve showed prolonged distal peak latency (5.2 ms, N<3.9) and reduced amplitude (5uV, N>10) F Wave studies indicate that the right Median F wave has normal latency..  The left median APB motor nerve showed prolonged distal onset latency (4.4 ms, N<4.0). The left Median 2nd Digit sensory nerve showed prolonged distal peak latency (4.3 ms, N<3.9).  F Wave studies indicate that the left Median F wave has normal latency.   Bilateral Ulnar ADM motor nerves were within normal limits. F Wave studies indicate that the bilateral Ulnar F waves have normal latencies The bilateral Ulnar 5th digit sensory nerves were within normal limits. The bilateral Radial sensory nerves were within normal limits.  The right peroneal motor nerve showed normal conductions with normal F wave latency.  The right tibial motor nerve showed normal conductions with normal F wave latency.  The right sural sensory nerve showed prolonged distal onset latency (5.1 ms, N <4.2)  The right  superficial peroneal sensory nerve was within normal limits.   EMG needle study of selected bilateral upper and right lower extremity muscles was performed.The right abductor pollicis brevis showed increased spontaneous activity (+4 positive sharp waves), increased motor unit amplitude, prolonged motor unit duration. The left abductor pollicis brevis showed increased motor unit amplitude and prolonged motor unit duration. The following muscles were normal: Deltoid, Triceps, Pronator Teres,First Dorsal Interosseous, extensor indices bilaterally. The following muscles in the right lower extremity were normal: The Vastus Medialis, Anterior Tibialis, Medial Gastrocnemius, Extensor Hallucis Longus and Abductor Hallucis muscles.  Conclusion: This is an abnormal study. There is electrophysiologic evidence of severe right Carpal Tunnel Syndrome and moderately severe left carpal tunnel syndrome. There is also coexisting mild axonal length dependent sensory neuropathy in the lower extremities.  Sarina Ill, MD  [facility]  [address] [place]  [place], [place] [address]  Phone [phone] Fax [phone]

## 2016-02-29 NOTE — Therapy (Signed)
[facility] [address] [place], [place], [address] Phone: [phone]   Fax:  [phone]  Patient Details  Name: Patricia [name] MRN: [id number] Date of Birth: 10-31-1929 Referring Provider:  No ref. provider found  Encounter Date: 02/29/2016   PHYSICAL THERAPY DISCHARGE SUMMARY  Visits from Start of Care: 7  Current functional level related to goals / functional outcomes:     PT Long Term Goals - 02/29/16 1109    PT LONG TERM GOAL #1   Title The patient will be indep with HEP for high level balance, LE strength and general mobility.   Baseline met 02/29/16   Time 4   Period Weeks   Status Achieved   PT LONG TERM GOAL #2   Title The patient will increase Berg from 42/56 to > or equal to 48/56 to demo decreasing risk for falls.   Baseline Scores 52/56 on 02/21/2016   Time 4   Period Weeks   Status Achieved   PT LONG TERM GOAL #3   Title The patient improve gait speed from 2.59 ft/sec to > or equal to 3.0 ft/sec to demo improving mobility.   Baseline Improved to 3.39 ft/sec on 02/21/2016   Time 4   Period Weeks   Status Achieved   PT LONG TERM GOAL #4   Title The patient will improve ABC scale from 60.6% to > or equal to 72% to demo improving confidence with balance activities.   Baseline Scored 83.1% on 02/29/2016   Time 4   Period Weeks   Status Achieved   PT LONG TERM GOAL #5   Title The patient will verbalize understanding of community exercises for post d/c activities.   Baseline verbalized plan of walking program and continue with HEP 02/29/16   Time 4   Period Weeks   Status Achieved        Remaining deficits: dec'd high level balance addressed through HEP   Education / Equipment: HEP, community exercise, d/c plan  Plan: Patient agrees to discharge.  Patient goals were met. Patient is being discharged due to meeting the stated rehab goals.  ?????       Thank you for the referral of this  patient. Rudell Cobb, MPT  Aldridge Krzyzanowski 02/29/2016, 2:20 PM  [facility] [address] [place], [place], [address] Phone: [phone]   Fax:  [phone]

## 2016-02-29 NOTE — Progress Notes (Signed)
  [facility]    Provider:  Dr Jaynee Eagles Referring Provider: Deland Pretty, MD Primary Care Physician:  Horatio Pel, MD  History: Patient is a lovely 82 year old female who I have seen in the past for an ischemic occipital stroke. She is here for a new problem. The left top of her foot was swollen. A skin biopsy at her pediatrician's diagnosed with what sounds like small fiber neuropathy. She has some cramping of the hands. Her podiatrist diagnosed her with neuropathy. She has numbness and tingling in the feet. She also has balance problems. She has some cramping in the feet. Both of her feet with electric shocks in the toes. Been going on for about a year.  Summary:   Nerve Conduction Studies were performed on the bilateral upper and right lower extremities.  The right median APB motor nerve showed prolonged distal onset latency (6.7 ms, N<4.0) and reduced amplitude (1.62mV, N>3) The right Median 2nd Digit sensory nerve showed prolonged distal peak latency (5.2 ms, N<3.9) and reduced amplitude (5uV, N>10) F Wave studies indicate that the right Median F wave has normal latency..  The left median APB motor nerve showed prolonged distal onset latency (4.4 ms, N<4.0). The left Median 2nd Digit sensory nerve showed prolonged distal peak latency (4.3 ms, N<3.9).  F Wave studies indicate that the left Median F wave has normal latency.   Bilateral Ulnar ADM motor nerves were within normal limits. F Wave studies indicate that the bilateral Ulnar F waves have normal latencies The bilateralUlnar 5th digit sensory nerves were within normal limits. The bilateral Radial sensory nerves were within normal limits.  The right peroneal motor nerve showed normal conductions with normal F wave latency.  The right tibial motor nerve showed normal conductions with normal F wave latency.  The right sural sensory nerve showed prolonged distal onset latency (5.1 ms, N <4.2)  The right  superficial peroneal sensory nerve was within normal limits.   EMG needle study of selected bilateral upper and right lower extremity muscles was performed.The right abductor pollicis brevis showed increased spontaneous activity (+4 positive sharp waves), increased motor unit amplitude, prolonged motor unit duration. The left abductor pollicis brevis showed increased motor unit amplitude and prolonged motor unit duration. The following muscles were normal: Deltoid, Triceps, Pronator Teres,First Dorsal Interosseous, extensor indices bilaterally. The following muscles in the right lower extremity were normal: The  Vastus Medialis, Anterior Tibialis, Medial Gastrocnemius, Extensor Hallucis Longus and Abductor Hallucis muscles.  Conclusion: This is an abnormal study. There is electrophysiologic evidence of severe right Carpal Tunnel Syndrome and moderately severe left carpal tunnel syndrome. There is also coexisting mild axonal length dependent sensory neuropathy in the lower extremities.  Sarina Ill, MD  [facility] [address] [place], [place] [address]  Phone [phone] Fax [phone]

## 2016-05-08 ENCOUNTER — Encounter (HOSPITAL_BASED_OUTPATIENT_CLINIC_OR_DEPARTMENT_OTHER): Payer: Self-pay | Admitting: *Deleted

## 2016-05-08 ENCOUNTER — Emergency Department (HOSPITAL_BASED_OUTPATIENT_CLINIC_OR_DEPARTMENT_OTHER)
Admission: EM | Admit: 2016-05-08 | Discharge: 2016-05-09 | Disposition: A | Discharge status: Home / Self Care | Payer: Medicare Other | Attending: Emergency Medicine | Admitting: Emergency Medicine

## 2016-05-08 DIAGNOSIS — Z23 Encounter for immunization: Secondary | ICD-10-CM | POA: Insufficient documentation

## 2016-05-08 DIAGNOSIS — Y929 Unspecified place or not applicable: Secondary | ICD-10-CM | POA: Diagnosis not present

## 2016-05-08 DIAGNOSIS — Y999 Unspecified external cause status: Secondary | ICD-10-CM | POA: Insufficient documentation

## 2016-05-08 DIAGNOSIS — S81811A Laceration without foreign body, right lower leg, initial encounter: Secondary | ICD-10-CM

## 2016-05-08 DIAGNOSIS — E039 Hypothyroidism, unspecified: Secondary | ICD-10-CM | POA: Insufficient documentation

## 2016-05-08 DIAGNOSIS — W230XXA Caught, crushed, jammed, or pinched between moving objects, initial encounter: Secondary | ICD-10-CM | POA: Diagnosis not present

## 2016-05-08 DIAGNOSIS — Z79899 Other long term (current) drug therapy: Secondary | ICD-10-CM | POA: Diagnosis not present

## 2016-05-08 DIAGNOSIS — Y9301 Activity, walking, marching and hiking: Secondary | ICD-10-CM | POA: Insufficient documentation

## 2016-05-08 MED ORDER — LIDOCAINE-EPINEPHRINE (PF) 2 %-1:200000 IJ SOLN
20.0000 mL | Freq: Once | INTRAMUSCULAR | Status: AC
  Administered 2016-05-08: 20 mL
  Filled 2016-05-08: qty 20

## 2016-05-08 MED ORDER — TETANUS-DIPHTH-ACELL PERTUSSIS 5-2.5-18.5 LF-MCG/0.5 IM SUSP
0.5000 mL | Freq: Once | INTRAMUSCULAR | Status: AC
  Administered 2016-05-08: 0.5 mL via INTRAMUSCULAR
  Filled 2016-05-08: qty 0.5

## 2016-05-08 NOTE — ED Triage Notes (Signed)
Storm door struck right heel approx 30 min PTA-lac noted with bandaids/bleed thru in place upon arrival

## 2016-05-08 NOTE — ED Notes (Signed)
2 inch horizontal laceration across right heel, bleeding controlled, tendon visible upon flexion of foot.  Pt able to flex and extend foot.

## 2016-05-08 NOTE — Discharge Instructions (Signed)
Avoid bending your foot. May wash wound with antibacterial soap and water. Otherwise, keep the wound clean and dry. Follow-up with your primary care doctor in 1 week for suture removal. Return to the emergency department if you experienced severe worsening symptoms, redness or swelling around your wound, inability to flex your foot, significant increase in pain.

## 2016-05-08 NOTE — ED Provider Notes (Signed)
[facility] DEPT [facility] Provider Note   CSN: [id number] Arrival date & time: 05/08/16  2007  First Provider Contact:  First MD Initiated Contact with Patient 05/08/16 2151   By signing my name below, I, Georgette Shell, attest that this documentation has been prepared under the direction and in the presence of Mouhamadou Gittleman, PA-C. Electronically Signed: Georgette Shell, ED Scribe. 05/08/16. 10:00 PM.  History   Chief Complaint Chief Complaint  Patient presents with  . Laceration   HPI Comments: Patricia [name] is a 82 y.o. female who presents to the Emergency Department for a laceration that occurred approximately 30 minutes just PTA. Pt reports she was walking into her daughter's house and the storm door struck her right heel. Bleeding is controlled with bandage. Pt is ambulatory and is able to flex her right foot. Denies modifying factors. Pt's Tdap is not UTD.   The history is provided by the patient. No language interpreter was used.    Past Medical History:  Diagnosis Date  . Hypothyroid   . Stroke [facility])     Patient Active Problem List   Diagnosis Date Noted  . MGUS (monoclonal gammopathy of unknown significance) 02/16/2016  . Quality of life palliative care encounter 02/16/2016  . Elevated blood pressure 02/16/2016  . Polyneuropathy ([place]) 01/15/2016  . Neuropathy ([place]) 01/10/2016  . Occipital stroke ([place]) 02/21/2015  . Altered mental status 01/26/2015  . Acute encephalopathy 01/26/2015  . Acute loss of vision 01/26/2015    Past Surgical History:  Procedure Laterality Date  . CATARACT EXTRACTION Bilateral   . EYE SURGERY Left 02/15/15    Laser surgery     OB History    No data available       Home Medications    Prior to Admission medications   Medication Sig Start Date End Date Taking? Authorizing Provider  cyclobenzaprine (FLEXERIL) 10 MG tablet Take 10 mg by mouth at bedtime.   Yes Historical Provider, MD  minocycline (DYNACIN) 50 MG tablet Take 50 mg by mouth 2  (two) times daily.   Yes Historical Provider, MD  aspirin 81 MG chewable tablet Chew 81 mg by mouth daily.      atorvastatin (LIPITOR) 10 MG tablet TAKE ONE TABLET BY MOUTH ONCE DAILY 02/13/16   Melvenia Beam, MD  Biotin 5000 MCG TABS Take 1 tablet by mouth daily.    Historical Provider, MD  CALCIUM PO Take 1,000 mg by mouth daily.    Historical Provider, MD  Cholecalciferol (VITAMIN D-3 PO) Take 1,000 mg by mouth daily.    Historical Provider, MD  Flaxseed, Linseed, (FLAX SEED OIL PO) Take 1,000 mg by mouth daily.    Historical Provider, MD  GLUCOSAMINE-CHONDROITIN PO Take 1,500 mg by mouth daily.    Historical Provider, MD  levothyroxine (SYNTHROID, LEVOTHROID) 88 MCG tablet Take 88 mcg by mouth daily before breakfast.    Historical Provider, MD  MELATONIN PO Take 5 mg by mouth daily.    Historical Provider, MD  Misc Natural Products (COLON CLEANSE PO) Take 450 mg by mouth daily.    Historical Provider, MD  Multiple Vitamin (MULTIVITAMIN) tablet Take 1 tablet by mouth daily.    Historical Provider, MD  Omega-3 Fatty Acids (FISH OIL PO) Take 1,200 mg by mouth daily.    Historical Provider, MD  polyethylene glycol (MIRALAX / GLYCOLAX) packet Take 17 g by mouth daily.    Historical Provider, MD  POTASSIUM PO Take 550 mg by mouth daily.    Historical Provider,  MD  Probiotic Product (PROBIOTIC DAILY PO) Take 1 tablet by mouth daily.    Historical Provider, MD  sertraline (ZOLOFT) 50 MG tablet Take 25 mg by mouth daily.    Historical Provider, MD  TURMERIC PO Take 750 mg by mouth daily.    Historical Provider, MD  vitamin B-12 (CYANOCOBALAMIN) 1000 MCG tablet Take 1,000 mcg by mouth daily.    Historical Provider, MD  vitamin C (ASCORBIC ACID) 500 MG tablet Take 500 mg by mouth daily.    Historical Provider, MD    Family History Family History  Problem Relation Age of Onset  . Heart disease Mother   . Throat cancer Maternal Aunt   . Cancer Maternal Aunt     breast ca  . Lupus Daughter   .  Anuerysm Daughter     Brain    Social History Social History  Substance Use Topics  . Smoking status: Never Smoker  . Smokeless tobacco: Never Used  . Alcohol use 0.0 oz/week     Comment: Very rarley      Allergies   Review of patient's allergies indicates no known allergies.   Review of Systems Review of Systems  Constitutional: Negative for fever.  Skin: Positive for wound.  All other systems reviewed and are negative.  Physical Exam Updated Vital Signs BP 176/90 (BP Location: Left Arm)   Pulse 80   Temp 98.6 F (37 C) (Oral)   Resp 20   Ht 5\' 6"  (1.676 m)   Wt 171 lb (77.6 kg)   SpO2 100%   BMI 27.60 kg/m   Physical Exam  Constitutional: She is oriented to person, place, and time. She appears well-developed and well-nourished. No distress.  HENT:  Head: Normocephalic and atraumatic.  Eyes: Conjunctivae are normal. Right eye exhibits no discharge. Left eye exhibits no discharge. No scleral icterus.  Cardiovascular: Normal rate.   Pulmonary/Chest: Effort normal.  Musculoskeletal: Normal range of motion.  No decreased ROM of ankle. Negative Homan's sign. Intact distal pulses.  Neurological: She is alert and oriented to person, place, and time. Coordination normal.  Skin: Skin is warm and dry. No rash noted. She is not diaphoretic. No erythema. No pallor.  2.5 horizontal laceration across right heel with Achilles' tendon exposure. No foreign bodies seen or palpated.  Psychiatric: She has a normal mood and affect. Her behavior is normal.  Nursing note and vitals reviewed.  ED Treatments / Results  DIAGNOSTIC STUDIES: Oxygen Saturation is 100% on RA, normal by my interpretation.    COORDINATION OF CARE: 9:54 PM Discussed treatment plan with pt at bedside which includes laceration repair and ankle brace and pt agreed to plan.  Labs (all labs ordered are listed, but only abnormal results are displayed) Labs Reviewed - No data to display  EKG  EKG  Interpretation None       Radiology No results found.  Procedures .Marland KitchenLaceration Repair Date/Time: 05/08/2016 10:40 PM Performed by: Donnald Garre TRIPP Authorized by: Donnald Garre TRIPP   Consent:    Consent obtained:  Verbal   Consent given by:  Patient Anesthesia (see MAR for exact dosages):    Anesthesia method:  Local infiltration Laceration details:    Location:  Leg   Leg location:  R lower leg   Length (cm):  2.5 Repair type:    Repair type:  Simple Pre-procedure details:    Preparation:  Patient was prepped and draped in usual sterile fashion Exploration:    Contaminated: no   Treatment:  Amount of cleaning:  Standard   Irrigation solution:  Sterile water   Irrigation method:  Pressure wash   Visualized foreign bodies/material removed: no   Skin repair:    Repair method:  Sutures   Suture size:  4-0   Suture material:  Prolene   Suture technique:  Simple interrupted   Number of sutures:  7 Approximation:    Approximation:  Close   Vermilion border: well-aligned   Post-procedure details:    Dressing:  Antibiotic ointment, non-adherent dressing and splint for protection   Patient tolerance of procedure:  Tolerated well, no immediate complications   Medications Ordered in ED Medications - No data to display   Initial Impression / Assessment and Plan / ED Course  I have reviewed the triage vital signs and the nursing notes.  Pertinent labs & imaging results that were available during my care of the patient were reviewed by me and considered in my medical decision making (see chart for details).  Clinical Course     Final Clinical Impressions(s) / ED Diagnoses   Final diagnoses:  Leg laceration, right, initial encounter   Tetanus updated in ED. Laceration occurred < 12 hours prior to repair. Able to visualize Achilles tendon through laceration. Does not appear to be injured. There is no decreased range of motion of her ankle. She was able  to flex and extend without difficulty. She had no difficulty ambulating prior to ED arrival. Patient tolerated the laceration repair without difficulty. She will need to have sutures removed in 14 days. Discussed home wound care with patient in detail. Patient also placed in cam boot to prevent tension to affected area.  Patient was discussed with and seen by Dr. Christy Gentles who agrees with the treatment plan.   New Prescriptions New Prescriptions   No medications on file  I personally performed the services described in this documentation, which was scribed in my presence. The recorded information has been reviewed and is accurate.      Dondra Spry [place], PA-C 05/09/16 0140    Ripley Fraise, MD 05/09/16 [phone]

## 2016-05-09 NOTE — ED Notes (Signed)
Pt and family verbalize understanding of dc instructions and deny any further needs at this time 

## 2016-05-27 ENCOUNTER — Ambulatory Visit: Payer: Medicare Other | Admitting: Neurology

## 2016-06-14 ENCOUNTER — Ambulatory Visit (HOSPITAL_COMMUNITY)
Admission: RE | Admit: 2016-06-14 | Discharge: 2016-06-14 | Disposition: A | Discharge status: Home / Self Care | Payer: Medicare Other | Source: Ambulatory Visit | Attending: Hematology and Oncology | Admitting: Hematology and Oncology

## 2016-06-14 ENCOUNTER — Other Ambulatory Visit (HOSPITAL_BASED_OUTPATIENT_CLINIC_OR_DEPARTMENT_OTHER): Payer: Medicare Other

## 2016-06-14 DIAGNOSIS — D472 Monoclonal gammopathy: Secondary | ICD-10-CM | POA: Diagnosis present

## 2016-06-14 DIAGNOSIS — R938 Abnormal findings on diagnostic imaging of other specified body structures: Secondary | ICD-10-CM | POA: Diagnosis not present

## 2016-06-14 DIAGNOSIS — M488X4 Other specified spondylopathies, thoracic region: Secondary | ICD-10-CM | POA: Insufficient documentation

## 2016-06-14 LAB — COMPREHENSIVE METABOLIC PANEL
ALT: 21 U/L (ref 0–55)
AST: 24 U/L (ref 5–34)
Albumin: 3.7 g/dL (ref 3.5–5.0)
Alkaline Phosphatase: 51 U/L (ref 40–150)
Anion Gap: 9 mEq/L (ref 3–11)
BUN: 21.1 mg/dL (ref 7.0–26.0)
CO2: 28 mEq/L (ref 22–29)
Calcium: 10.1 mg/dL (ref 8.4–10.4)
Chloride: 102 mEq/L (ref 98–109)
Creatinine: 0.8 mg/dL (ref 0.6–1.1)
EGFR: 69 mL/min/{1.73_m2} — ABNORMAL LOW (ref >90)
Glucose: 103 mg/dl (ref 70–140)
Potassium: 4.5 mEq/L (ref 3.5–5.1)
Sodium: 138 mEq/L (ref 136–145)
Total Bilirubin: 0.61 mg/dL (ref 0.20–1.20)
Total Protein: 7.5 g/dL (ref 6.4–8.3)

## 2016-06-14 LAB — CBC WITH DIFFERENTIAL/PLATELET
BASO%: 1 % (ref 0.0–2.0)
Basophils Absolute: 0.1 10*3/uL (ref 0.0–0.1)
EOS%: 1.8 % (ref 0.0–7.0)
Eosinophils Absolute: 0.1 10*3/uL (ref 0.0–0.5)
HCT: 39 % (ref 34.8–46.6)
HGB: 12.8 g/dL (ref 11.6–15.9)
LYMPH%: 23.5 % (ref 14.0–49.7)
MCH: 30.7 pg (ref 25.1–34.0)
MCHC: 32.7 g/dL (ref 31.5–36.0)
MCV: 93.9 fL (ref 79.5–101.0)
MONO#: 0.4 10*3/uL (ref 0.1–0.9)
MONO%: 8.3 % (ref 0.0–14.0)
NEUT#: 3.4 10*3/uL (ref 1.5–6.5)
NEUT%: 65.4 % (ref 38.4–76.8)
Platelets: 245 10*3/uL (ref 145–400)
RBC: 4.15 10*6/uL (ref 3.70–5.45)
RDW: 13 % (ref 11.2–14.5)
WBC: 5.2 10*3/uL (ref 3.9–10.3)
lymph#: 1.2 10*3/uL (ref 0.9–3.3)

## 2016-06-17 LAB — KAPPA/LAMBDA LIGHT CHAINS
Ig Kappa Free Light Chain: 27.6 mg/L — ABNORMAL HIGH (ref 3.3–19.4)
Ig Lambda Free Light Chain: 14.5 mg/L (ref 5.7–26.3)
Kappa/Lambda FluidC Ratio: 1.9 — ABNORMAL HIGH (ref 0.26–1.65)

## 2016-06-19 LAB — MULTIPLE MYELOMA PANEL, SERUM
Albumin SerPl Elph-Mcnc: 3.9 g/dL (ref 2.9–4.4)
Albumin/Glob SerPl: 1.3 (ref 0.7–1.7)
Alpha 1: 0.2 g/dL (ref 0.0–0.4)
Alpha2 Glob SerPl Elph-Mcnc: 0.6 g/dL (ref 0.4–1.0)
B-Globulin SerPl Elph-Mcnc: 1.6 g/dL — ABNORMAL HIGH (ref 0.7–1.3)
Gamma Glob SerPl Elph-Mcnc: 0.9 g/dL (ref 0.4–1.8)
Globulin, Total: 3.2 g/dL (ref 2.2–3.9)
IgA, Qn, Serum: 1248 mg/dL — ABNORMAL HIGH (ref 64–422)
IgG, Qn, Serum: 840 mg/dL (ref 700–1600)
IgM, Qn, Serum: 124 mg/dL (ref 26–217)
M Protein SerPl Elph-Mcnc: 0.6 g/dL — ABNORMAL HIGH
Total Protein: 7.1 g/dL (ref 6.0–8.5)

## 2016-06-21 ENCOUNTER — Ambulatory Visit (HOSPITAL_BASED_OUTPATIENT_CLINIC_OR_DEPARTMENT_OTHER): Payer: Medicare Other | Admitting: Hematology and Oncology

## 2016-06-21 ENCOUNTER — Encounter: Payer: Self-pay | Admitting: Hematology and Oncology

## 2016-06-21 ENCOUNTER — Telehealth: Payer: Self-pay | Admitting: Hematology and Oncology

## 2016-06-21 VITALS — BP 154/93 | HR 80 | Temp 98.8°F | Resp 18 | Ht 66.0 in | Wt 175.3 lb

## 2016-06-21 DIAGNOSIS — S22000A Wedge compression fracture of unspecified thoracic vertebra, initial encounter for closed fracture: Secondary | ICD-10-CM | POA: Diagnosis not present

## 2016-06-21 DIAGNOSIS — D472 Monoclonal gammopathy: Secondary | ICD-10-CM

## 2016-06-21 DIAGNOSIS — G629 Polyneuropathy, unspecified: Secondary | ICD-10-CM

## 2016-06-21 NOTE — Progress Notes (Signed)
[facility] OFFICE PROGRESS NOTE  Patient Care Team: Deland Pretty, MD as PCP - General (Internal Medicine) Heath Lark, MD as Consulting Physician (Hematology and Oncology) Melvenia Beam, MD as Consulting Physician (Neurology)  SUMMARY OF ONCOLOGIC HISTORY:  Patricia [name] 82 y.o. female is here because of recent discovery of IgA MGUS It was discovered during workup for peripheral neuropathy by her neurologist She denies history of abnormal bone pain or bone fracture. Patient denies history of recurrent infection or atypical infections such as shingles of meningitis. Denies chills, night sweats, anorexia or abnormal weight loss. The patient has remote history of occipital stroke and chronic neuropathy. She denies persistent neurological deficit. Her main complain some mild neuropathy and gait disturbances.  INTERVAL HISTORY: Please see below for problem oriented charting. She returns today with her daughter. Her daughter disclosed an incident when she fell several months ago. She denies recent new back pain. No new bone pain. She continues to have peripheral neuropathy. Denies recent infection. No changes in appetite or weight  REVIEW OF SYSTEMS:   Constitutional: Denies fevers, chills or abnormal weight loss Eyes: Denies blurriness of vision Ears, nose, mouth, throat, and face: Denies mucositis or sore throat Respiratory: Denies cough, dyspnea or wheezes Cardiovascular: Denies palpitation, chest discomfort or lower extremity swelling Gastrointestinal:  Denies nausea, heartburn or change in bowel habits Skin: Denies abnormal skin rashes Lymphatics: Denies new lymphadenopathy or easy bruising Behavioral/Psych: Mood is stable, no new changes  All other systems were reviewed with the patient and are negative.  I have reviewed the past medical history, past surgical history, social history and family history with the patient and they are unchanged from previous  note.  ALLERGIES:  has No Known Allergies.  MEDICATIONS:  Current Outpatient Prescriptions  Medication Sig Dispense Refill  . aspirin 81 MG chewable tablet Chew 81 mg by mouth daily.    Marland Kitchen atorvastatin (LIPITOR) 10 MG tablet TAKE ONE TABLET BY MOUTH ONCE DAILY 30 tablet 11  . Biotin 5000 MCG TABS Take 1 tablet by mouth daily.    Marland Kitchen CALCIUM PO Take 1,000 mg by mouth daily.    . Cholecalciferol (VITAMIN D-3 PO) Take 1,000 Units by mouth 2 (two) times daily.     . cyclobenzaprine (FLEXERIL) 10 MG tablet Take 10 mg by mouth at bedtime.    . Flaxseed, Linseed, (FLAX SEED OIL PO) Take 1,000 mg by mouth daily.    Marland Kitchen GLUCOSAMINE-CHONDROITIN PO Take 1,500 mg by mouth daily.    Marland Kitchen levothyroxine (SYNTHROID, LEVOTHROID) 88 MCG tablet Take 88 mcg by mouth daily before breakfast.    . MELATONIN PO Take 5 mg by mouth at bedtime as needed.     . mirabegron ER (MYRBETRIQ) 50 MG TB24 tablet Take 50 mg by mouth daily.    . Multiple Vitamin (MULTIVITAMIN) tablet Take 1 tablet by mouth daily.    . Omega-3 Fatty Acids (FISH OIL PO) Take 1,200 mg by mouth daily.    . polyethylene glycol (MIRALAX / GLYCOLAX) packet Take 17 g by mouth daily.    Marland Kitchen POTASSIUM PO Take 550 mg by mouth daily.    . Probiotic Product (PROBIOTIC DAILY PO) Take 1 tablet by mouth daily.    . sertraline (ZOLOFT) 50 MG tablet Take 25 mg by mouth daily.    . TURMERIC PO Take 750 mg by mouth daily.    . vitamin B-12 (CYANOCOBALAMIN) 1000 MCG tablet Take 1,000 mcg by mouth daily.    . vitamin C (  ASCORBIC ACID) 500 MG tablet Take 500 mg by mouth daily.     No current facility-administered medications for this visit.     PHYSICAL EXAMINATION: ECOG PERFORMANCE STATUS: 1 - Symptomatic but completely ambulatory  Vitals:   06/21/16 1027  BP: (!) 154/93  Pulse: 80  Resp: 18  Temp: 98.8 F (37.1 C)   Filed Weights   06/21/16 1027  Weight: 175 lb 4.8 oz (79.5 kg)    GENERAL:alert, no distress and comfortable SKIN: skin color, texture,  turgor are normal, no rashes or significant lesions EYES: normal, Conjunctiva are pink and non-injected, sclera clear Musculoskeletal:no cyanosis of digits and no clubbing  NEURO: alert & oriented x 3 with fluent speech, no focal motor/sensory deficits  LABORATORY DATA:  I have reviewed the data as listed    Component Value Date/Time   NA 138 06/14/2016 1133   K 4.5 06/14/2016 1133   CL 98 01/10/2016 1246   CO2 28 06/14/2016 1133   GLUCOSE 103 06/14/2016 1133   BUN 21.1 06/14/2016 1133   CREATININE 0.8 06/14/2016 1133   CALCIUM 10.1 06/14/2016 1133   PROT 7.1 06/14/2016 1133   PROT 7.5 06/14/2016 1133   ALBUMIN 3.7 06/14/2016 1133   AST 24 06/14/2016 1133   ALT 21 06/14/2016 1133   ALKPHOS 51 06/14/2016 1133   BILITOT 0.61 06/14/2016 1133   GFRNONAA 79 01/10/2016 1246   GFRAA 91 01/10/2016 1246    No results found for: SPEP, UPEP  Lab Results  Component Value Date   WBC 5.2 06/14/2016   NEUTROABS 3.4 06/14/2016   HGB 12.8 06/14/2016   HCT 39.0 06/14/2016   MCV 93.9 06/14/2016   PLT 245 06/14/2016      Chemistry      Component Value Date/Time   NA 138 06/14/2016 1133   K 4.5 06/14/2016 1133   CL 98 01/10/2016 1246   CO2 28 06/14/2016 1133   BUN 21.1 06/14/2016 1133   CREATININE 0.8 06/14/2016 1133      Component Value Date/Time   CALCIUM 10.1 06/14/2016 1133   ALKPHOS 51 06/14/2016 1133   AST 24 06/14/2016 1133   ALT 21 06/14/2016 1133   BILITOT 0.61 06/14/2016 1133       RADIOGRAPHIC STUDIES:I reviewed the skeletal survey with her I have personally reviewed the radiological images as listed and agreed with the findings in the report.    ASSESSMENT & PLAN:  MGUS (monoclonal gammopathy of unknown significance) I discussed with the patient & her daughter the natural history of MGUS. Given though her workup for myeloma is incomplete, she has no evidence of organ damage. I recommend seeing her back in 10 months for repeat blood work. She agreed with the  plan of care  Polyneuropathy [facility]) This is unlikely related to paraproteinemia. I will defer to her neurologist for further management  Compression fracture of thoracic vertebra [facility]) I reviewed the x-ray with the patient and her daughter. She had recent fall not long ago and had new compression fracture noted on her skeletal survey. She is not symptomatic. I reinforced the importance of vitamin D supplement   Orders Placed This Encounter  Procedures  . CBC with Differential/Platelet    Standing Status:   Future    Standing Expiration Date:   07/26/2017  . Comprehensive metabolic panel    Standing Status:   Future    Standing Expiration Date:   07/26/2017  . Kappa/lambda light chains    Standing Status:   Future  Standing Expiration Date:   07/26/2017  . Multiple Myeloma Panel (SPEP&IFE w/QIG)    Standing Status:   Future    Standing Expiration Date:   07/26/2017   All questions were answered. The patient knows to call the clinic with any problems, questions or concerns. No barriers to learning was detected. I spent 20 minutes counseling the patient face to face. The total time spent in the appointment was 25 minutes and more than 50% was on counseling and review of test results     [facility], [place], MD 06/21/2016 12:17 PM

## 2016-06-21 NOTE — Telephone Encounter (Signed)
Gave patient avs report and appointments for July 2018.  °

## 2016-06-21 NOTE — Assessment & Plan Note (Signed)
I discussed with the patient & her daughter the natural history of MGUS. Given though her workup for myeloma is incomplete, she has no evidence of organ damage. I recommend seeing her back in 10 months for repeat blood work. She agreed with the plan of care

## 2016-06-21 NOTE — Assessment & Plan Note (Signed)
I reviewed the x-ray with the patient and her daughter. She had recent fall not long ago and had new compression fracture noted on her skeletal survey. She is not symptomatic. I reinforced the importance of vitamin D supplement

## 2016-06-21 NOTE — Assessment & Plan Note (Signed)
This is unlikely related to paraproteinemia. I will defer to her neurologist for further management

## 2016-07-04 ENCOUNTER — Telehealth: Payer: Self-pay | Admitting: *Deleted

## 2016-07-04 NOTE — Telephone Encounter (Signed)
LVM for pt's daughter informing her ok w/ Dr. Alvy Bimler for pt to take fosamax.

## 2016-07-04 NOTE — Telephone Encounter (Signed)
OK to prescribe 

## 2016-07-04 NOTE — Telephone Encounter (Signed)
Rheumatologist, Dr. Kathlene November at [facility], wants to prescribe Fosamax once weekly for pt..  Daughter asks if this is ok w/ Dr. Alvy Bimler?

## 2016-09-02 ENCOUNTER — Encounter: Payer: Self-pay | Admitting: Neurology

## 2016-09-02 ENCOUNTER — Ambulatory Visit (INDEPENDENT_AMBULATORY_CARE_PROVIDER_SITE_OTHER): Payer: Medicare Other | Admitting: Neurology

## 2016-09-02 VITALS — BP 138/83 | HR 78 | Ht 66.0 in | Wt 176.0 lb

## 2016-09-02 DIAGNOSIS — G629 Polyneuropathy, unspecified: Secondary | ICD-10-CM | POA: Diagnosis not present

## 2016-09-02 DIAGNOSIS — R531 Weakness: Secondary | ICD-10-CM

## 2016-09-02 DIAGNOSIS — R2689 Other abnormalities of gait and mobility: Secondary | ICD-10-CM

## 2016-09-02 DIAGNOSIS — I639 Cerebral infarction, unspecified: Secondary | ICD-10-CM

## 2016-09-02 DIAGNOSIS — W19XXXD Unspecified fall, subsequent encounter: Secondary | ICD-10-CM

## 2016-09-02 MED ORDER — ATORVASTATIN CALCIUM 10 MG PO TABS: 10.0000 mg | ORAL_TABLET | Freq: Every day | ORAL | 11 refills | Status: DC

## 2016-09-02 NOTE — Progress Notes (Signed)
[facility]    Provider:  Dr Jaynee Eagles Referring Provider: Deland Pretty, MD Primary Care Physician:  Horatio Pel, MD   CC: stroke and small-fiber neuropathy, falls and imbalance  Interval history 09/02/2016: She is going to have surgery on her right wrist for CTS. She has severe knee pain and is getting injections in her knees. Difficulty with stairs and standing because th eknees are hurting so much. She feels weakness in the legs. She has had 2 falls, she was at her grandson's employment and she sat down on stool and fell off, the second time it was due to balance she became off balance and fell over. She has an L1 fractrure now. She is on Fosamax and vitamin D3 for osteopenia. She continues to have tingling in the feet which is not painful. She loves Dr. Caralyn Guile. He is great. She is taking asoirin every day and not missing it.   Interval history 01/10/2016:  Patient is a lovely 82 year old female who I have seen in the past for an ischemic occipital stroke. She is here for a new problem. The left top of her foot was swollen. A skin biopsy at her pediatrician's diagnosed with what sounds like small fiber neuropathy. She has some cramping of the hands. Her podiatrist diagnosed her with neuropathy. She has numbness and tingling in the feet. She also has balance problems. She has some cramping in the feet. Both of her feet with electric shocks in the toes. Been going on for about a year. Balance is worsening.No falls. Her husband recently passed away. Feels like she is walking on pads. Had a lengthy discussion idiopathic small fiber neuropathy, diabetes which is the most common diagnosis that there are a variety of other conditions that can cause this. Often it is idiopathic. Discussed the pathophysiology and the reason for impaired balance.  HPI: Patricia [name] is a 82 y.o. female here as a follow up. PMHx hypothyroidism, mild tremor. She is doing well. No new events. No  other problems. Her LDL was recently taken and is 57. The end of words are sometimes hard to read. The end of the letters are blurred. Her vision has improved.   MRA: IMPRESSION: Abnormal MRA brain showing severe atheromatous changes in distal left posterior cerebral artery and moderate atheromatous narrowing of distal right posterior cerebral artery. Persistent fetal pattern of origin of right posterior cerebral and hypoplastic terminal right vertebral and A1 segment of right anterior cerebral arteries are benign birth variants  Interval update 02/20/2015: Patient returns today after findings on MRI of the brain indicate an subacute ischemic event in the occipital area. Explained to patient that this is likely the reason for her symptoms that she was seen in the office last month. The area affected by stroke can also affect vision, and patient still reports blurry vision and difficulty reading. She was accompanied by daughter today. Reviewed all images with this lovely patient and her very nice daughter, showed patient to stroke, discussed the differential and different causes for this. Patient's MRI also showed nonspecific white matter changes likely small vessel chronic ischemic changes. Discussed with daughter and patient that we should start Lipitor, she should continue taking an aspirin for stroke prevention. Also feel as though a complete stroke workup is indicated including imaging of the vessels in the head, echocardiogram with bubble study.  MRi of the brain: IMPRESSION: This is an abnormal MRI of the brain with and without contrast showed the following: 1. Small focus in the left  occipital lobe likely representing a small sub-chronic stroke.  2. Age-appropriate minimal atrophy and mild small vessel ischemic change. Recently reviewed images and agree with this report. Patient also brings in labs which show unremarkable CBC, unremarkable CMP, LDL mildly elevated at 129 goal is less than 70,  TSH within normal limits.  Initial visit 01/26/2015: She was at home on March 25th, had a headache on the left frontal area, was tring to change the channel, she couldn't get it to change. She felt confused, doesn't have an app to the change the channel. The controls were right in front of her. She lost consciousness from then until midnight. She regained consciousness in the same place, shocked it was midnight. Tried to use cell phone again, still confused at that time. Didn't know how to get to her bedroom, couldn't get the TV off. She turned the power button off and went to bed. Vision is blurry since the episode, right prisms, muted colors for several days afterwards. No fhx seizure, or migraines. No recents infections but did hit her ankle on March 4th a hitch which was swollen. There was swelling and bruising but no cellulitis. Here with daughter who has never noticed anything unusual. No urinary symptoms. No other episodes of staring, strange behavior, altered awareness, loss of urine, biting of tongue, finding herself on floor or in strange places, getting lost in the car. Doesn't believe she fell asleep. She has a lot of stress, her husband had a stroke. She is main caretaker.    Review of Systems: Patient complains of symptoms per HPI as well as the following symptoms: Denies Chest pain and denies shortness of breath denies fever and systemic symptoms. Pertinent negatives per HPI. All others negative.  Review of Systems: Patient complains of symptoms per HPI as well as the following symptoms: no CP, no SOB. Pertinent negatives per HPI. All others negative   Social History   Social History  . Marital status: Married    Spouse name: Joe   . Number of children: 4  . Years of education: 16+   Occupational History  . Not on file.   Social History Main Topics  . Smoking status: Never Smoker  . Smokeless tobacco: Never Used  . Alcohol use 0.0 oz/week     Comment: Very rarley   .  Drug use: No  . Sexual activity: Not on file     Comment: widowed, 2 daughters and 1 son. Retired Education officer, museum   Other Topics Concern  . 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 Heart disease Mother   . Throat cancer Maternal Aunt   . Cancer Maternal Aunt     breast ca  . Lupus Daughter   . Anuerysm Daughter     Brain    Past Medical History:  Diagnosis Date  . Hypothyroid   . Stroke [facility])     Past Surgical History:  Procedure Laterality Date  . CATARACT EXTRACTION Bilateral   . EYE SURGERY Left 02/15/15    Laser surgery     Current Outpatient Prescriptions  Medication Sig Dispense Refill  . Alendronate Sodium (FOSAMAX PO) Take 1 tablet by mouth once a week.    Marland Kitchen aspirin 81 MG chewable tablet Chew 81 mg by mouth daily.    Marland Kitchen  atorvastatin (LIPITOR) 10 MG tablet TAKE ONE TABLET BY MOUTH ONCE DAILY 30 tablet 11  . Biotin 5000 MCG TABS Take 1 tablet by mouth daily.    Marland Kitchen CALCIUM PO Take 1,000 mg by mouth daily.    . Cholecalciferol (VITAMIN D-3 PO) Take 2,000 Units by mouth 2 (two) times daily.     . cyclobenzaprine (FLEXERIL) 10 MG tablet Take 10 mg by mouth at bedtime.    . Flaxseed, Linseed, (FLAX SEED OIL PO) Take 1,000 mg by mouth daily.    Marland Kitchen GLUCOSAMINE-CHONDROITIN PO Take 1,500 mg by mouth daily.    Marland Kitchen levothyroxine (SYNTHROID, LEVOTHROID) 88 MCG tablet Take 88 mcg by mouth daily before breakfast.    . MELATONIN PO Take 5 mg by mouth at bedtime as needed.     . mirabegron ER (MYRBETRIQ) 50 MG TB24 tablet Take 50 mg by mouth daily.    . Multiple Vitamin (MULTIVITAMIN) tablet Take 1 tablet by mouth daily.    . Omega-3 Fatty Acids (FISH OIL PO) Take 1,200 mg by mouth daily.    . polyethylene glycol (MIRALAX / GLYCOLAX) packet Take 17 g by mouth daily.    Marland Kitchen POTASSIUM PO Take 550 mg by mouth daily.    . Probiotic Product  (PROBIOTIC DAILY PO) Take 1 tablet by mouth daily.    . sertraline (ZOLOFT) 50 MG tablet Take 25 mg by mouth daily.    . TURMERIC PO Take 750 mg by mouth daily.    . vitamin B-12 (CYANOCOBALAMIN) 1000 MCG tablet Take 1,000 mcg by mouth daily.    . vitamin C (ASCORBIC ACID) 500 MG tablet Take 500 mg by mouth daily.     No current facility-administered medications for this visit.     Allergies as of 09/02/2016  . (No Known Allergies)    Vitals: BP 138/83 (BP Location: Right Arm, Patient Position: Sitting, Cuff Size: Normal)   Pulse 78   Ht 5\' 6"  (1.676 m)   Wt 176 lb (79.8 kg)   BMI 28.41 kg/m  Last Weight:  Wt Readings from Last 1 Encounters:  09/02/16 176 lb (79.8 kg)   Last Height:   Ht Readings from Last 1 Encounters:  09/02/16 5\' 6"  (1.676 m)     Speech:  Speech is normal; fluent and spontaneous with normal comprehension.  Cognition:  The patient is oriented to person, place, and time;   recent and remote memory intact;   language fluent;   normal attention, concentration,   fund of knowledge Cranial Nerves:  The pupils are equal, round, and reactive to light. The fundi are flat. Visual fields are full to finger confrontation. Extraocular movements are intact. Trigeminal sensation is intact and the muscles of mastication are normal. The face is symmetric. The palate elevates in the midline. Hearing intact. Voice is normal. Shoulder shrug is normal. The tongue has normal motion without fasciculations.   Sensation: Intact pin prick LE, decreased temperature glove and stocking. Absent vibration at the great toes, few seconds at medial malleoli. Impaired proprioception distally.  Sway on [place].  Motor observation: Some loss of muscle, atrophy in the intrinsic hand muscles most pronounced in the FDI and hyperthenar areas.   Assessment/Plan: This is a lovely 82 year old female who is here with her daughter for follow-up. She presented in April  after an episode of altered awareness and possible loss of consciousness with vision changes. MRI of the brain showed a subacute left occipital ischemic infarct. MRA showed severe atheromatous changes in distal  left posterior cerebral artery and moderate atheromatous narrowing of distal right posterior cerebral artery. She has been recently diagnosed with small-fiber neuropathy in the feet. Exam does show decreased sensation in a glove and stocking distribution in both small and large fibers.   Neuropathy: serum panel was negative except for MGUS and she follows with hematology  CTS: She is seeing Dr Apolonio Schneiders  Falls: Physical therapy for gait and balance, needs to use a walking aid  Stroke: Follow very closely with primary care for management of vascular risk factors Patient on aspirin, she should continue to take this for stroke prevention. Started patient on Lipitor for LDL goal less than 70. Carotid Dopplers were already completed and unremarkable. echocardiogram with bubble study unremarkable.   Sarina Ill, MD  [facility] [address] [place], [place] [address]  Phone [phone] Fax [phone]  A total of 30 minutes was spent face-to-face with this patient. Over half this time was spent on counseling patient on the left occipital stroke due to distal left pca artery atheromatous changes, distal polyneuropathy diagnosis, falls and different diagnostic and therapeutic options available.

## 2016-09-02 NOTE — Patient Instructions (Signed)
Remember to drink plenty of fluid, eat healthy meals and do not skip any meals. Try to eat protein with a every meal and eat a healthy snack such as fruit or nuts in between meals. Try to keep a regular sleep-wake schedule and try to exercise daily, particularly in the form of walking, 20-30 minutes a day, if you can.   As far as your medications are concerned, I would like to suggest: continue current medications, Physical therapy  I would like to see you back in 6-9 months, sooner if we need to. Please call us with any interim questions, concerns, problems, updates or refill requests.   Our phone number is [phone]. We also have an after hours call service for urgent matters and there is a physician on-call for urgent questions. For any emergencies you know to call 911 or go to the nearest emergency room

## 2016-09-03 DIAGNOSIS — G629 Polyneuropathy, unspecified: Secondary | ICD-10-CM | POA: Insufficient documentation

## 2016-09-12 ENCOUNTER — Ambulatory Visit: Payer: Medicare Other | Attending: Neurology | Admitting: Physical Therapy

## 2016-09-12 DIAGNOSIS — M6281 Muscle weakness (generalized): Secondary | ICD-10-CM | POA: Insufficient documentation

## 2016-09-12 DIAGNOSIS — R2681 Unsteadiness on feet: Secondary | ICD-10-CM | POA: Insufficient documentation

## 2016-09-12 DIAGNOSIS — R2689 Other abnormalities of gait and mobility: Secondary | ICD-10-CM | POA: Insufficient documentation

## 2016-09-20 ENCOUNTER — Ambulatory Visit: Payer: Medicare Other | Admitting: Rehabilitation

## 2016-09-20 ENCOUNTER — Encounter: Payer: Self-pay | Admitting: Rehabilitation

## 2016-09-20 DIAGNOSIS — M6281 Muscle weakness (generalized): Secondary | ICD-10-CM

## 2016-09-20 DIAGNOSIS — R2689 Other abnormalities of gait and mobility: Secondary | ICD-10-CM

## 2016-09-20 DIAGNOSIS — R2681 Unsteadiness on feet: Secondary | ICD-10-CM

## 2016-09-20 NOTE — Therapy (Signed)
[facility] [address] [place], [place], [address] Phone: [phone]   Fax:  [phone]  Physical Therapy Treatment  Patient Details  Name: Patricia [name] MRN: [id number] Date of Birth: [birth year] Referring Provider: Sarina Ill, MD  Encounter Date: 09/20/2016      PT End of Session - 09/20/16 1055    Visit Number 1   Number of Visits 17   Date for PT Re-Evaluation 11/19/16   Authorization Type [employer] [place] on every 10th visit   PT Start Time [phone]  pt late to appt   PT Stop Time 1020   PT Time Calculation (min) 42 min   Activity Tolerance Patient limited by pain   Behavior During Therapy [facility] for tasks assessed/performed      Past Medical History:  Diagnosis Date  . Hypothyroid   . Stroke [facility])     Past Surgical History:  Procedure Laterality Date  . CATARACT EXTRACTION Bilateral   . EYE SURGERY Left 02/15/15    Laser surgery     There were no vitals filed for this visit.      Subjective Assessment - 09/20/16 0944    Subjective "Dr. Jaynee Eagles sent me over here, she is so sweet."    Limitations House hold activities;Walking;Standing   Patient Stated Goals "I want to work on my balance."    Currently in Pain? Yes   Pain Score 3    Pain Location Knee   Pain Orientation Right;Left   Pain Descriptors / Indicators Aching   Pain Type Chronic pain   Pain Onset More than a month ago   Pain Frequency Constant   Aggravating Factors  walking    Pain Relieving Factors voltaren gelTori Milks            [facility] PT Assessment - 09/20/16 0946      Assessment   Medical Diagnosis balance   Referring Provider Sarina Ill, MD   Onset Date/Surgical Date --  per daughter, reports decreased balance around July   Prior Therapy finished OP PT in May     Precautions   Precautions Fall     Balance Screen   Has the patient fallen in the past 6 months Yes   How many times? 2   Has the patient had a decrease  in activity level because of a fear of falling?  No   Is the patient reluctant to leave their home because of a fear of falling?  No     Home Environment   Living Environment Private residence   Living Arrangements Alone   Available Help at Discharge Family;Available PRN/intermittently   Type of Home House   Home Access Stairs to enter   Entrance Stairs-Number of Steps 3   Entrance Stairs-Rails Can reach both   Home Layout Two level   Alternate Level Stairs-Number of Steps 12   Alternate Level Stairs-Rails Can reach both   [place] - 4 wheels;Cane - single point  uses toilet paper roll and vanity to get up from toilet     Prior Function   Level of Independence Independent  has a cleaning lady   Vocation Retired   Leisure spend time with family (grand and great grand kids)     Cognition   Overall Cognitive Status Within Functional Limits for tasks assessed     Sensation   Light Touch Impaired Detail   Light Touch Impaired Details Impaired RLE;Impaired LLE  stocking numbness/tingling   Hot/Cold Appears  Intact   Proprioception Appears Intact     Coordination   Gross Motor Movements are Fluid and Coordinated Yes   Fine Motor Movements are Fluid and Coordinated Yes     ROM / Strength   AROM / PROM / Strength Strength     Strength   Overall Strength Deficits   Overall Strength Comments L hip flex 2-/5 (due to pain and weakness), L knee ext 2/5 (due to pain and weakness), all others grossly 4/5 (including RLE)     Transfers   Transfers Sit to Stand;Stand to Sit   Sit to Stand 5: Supervision   Sit to Stand Details Verbal cues for precautions/safety   Five time sit to stand comments  22.53 secs with BUE support   Stand to Sit 5: Supervision   Stand to Sit Details (indicate cue type and reason) Verbal cues for precautions/safety     Ambulation/Gait   Ambulation/Gait Yes   Ambulation/Gait Assistance 4: Min guard;5: Supervision   Ambulation/Gait Assistance  Details Pt ambulatory in clinic without AD and note very antaglic gait pattern causing marked instability.  Checked gait speed without AD, however at end of session provided pt with rollator as she states she has this at home and ambulated short distance.  Mild improvement, however PT feels that she needs more support/stability, therefore also trialed use of RW during session with marked improvement in stability and safety.  Provided education that this would be safest to use at this time (at all times) due to inreased knee pain and instability.  Pt verbalized understanding.     Ambulation Distance (Feet) 115 Feet  20' x 2 reps   Assistive device Rolling walker;4-wheeled walker;None   Gait Pattern Decreased arm swing - left;Decreased step length - right;Decreased stance time - left;Decreased arm swing - right;Decreased stride length;Decreased weight shift to left;Lateral hip instability;Antalgic   Ambulation Surface Level;Indoor   Gait velocity 1.33 ft/sec without AD                             PT Education - 09/20/16 1054    Education provided Yes   Education Details education on POC, goals and use of RW at all times for safety and stability    Person(s) Educated Patient   Methods Explanation;Demonstration   Comprehension Verbalized understanding;Returned demonstration          PT Short Term Goals - 09/20/16 1102      PT SHORT TERM GOAL #1   Title Pt will initiate HEP in order to indicate improved functional mobility.  (Target Date: 10/19/15)   Time 4   Period Weeks   Status New     PT SHORT TERM GOAL #2   Title Will assess BERG and improve score by 4 points in order to indicate decreased fall risk.     Time 4   Period Weeks   Status New     PT SHORT TERM GOAL #3   Title Pt will perform 5TSS in <18 secs with single UE support only in order to indicate improved functional strength.     Time 4   Period Weeks   Status New     PT SHORT TERM GOAL #4   Title  Pt will demonstrate compliance with using RW at all times to decrease fall risk and improve stability with decreased knee pain.     Time 4   Period Weeks   Status New  PT SHORT TERM GOAL #5   Title Pt will improve gait speed to 1.93 ft/sec w/ LRAD in order to indicate decreased fall risk.    Time 4   Period Weeks   Status New           PT Long Term Goals - 09/20/16 1106      PT LONG TERM GOAL #1   Title The patient will be indep with HEP for high level balance, LE strength and general mobility. (Target Date: 11/15/16)   Time 8   Period Weeks   Status New     PT LONG TERM GOAL #2   Title Pt will improve BERG balance score 8 points from baseline in order to indicate decreased fall risk.     Time 8   Period Weeks   Status New     PT LONG TERM GOAL #3   Title Pt will improve gait speed to 2.53 ft/sec in order to indicate decreased fall risk and improved efficiency of gait.    Time 8   Period Weeks   Status New     PT LONG TERM GOAL #4   Title Pt will ambulate 500' over unlevel paved surfaces w/ LRAD at mod I level in order to indicate safe community mobility.     Time 8   Period Weeks   Status New     PT LONG TERM GOAL #5   Title The patient will verbalize understanding of community exercises for post d/c activities.   Time 8   Period Weeks   Status New               Plan - 09/20/16 1056    Clinical Impression Statement Pt presents with decreasing balance with two falls since June of this year with history of B knee pain due to arthritis causing marked instability.  Note that she is looking to make an appt with orthopedic MD in the near future to address knee pain.  She also states that when she sits for a prolonged period of time, getting up and moving is extremely difficult to impossible due to stiffness.  Upon PT evaluation, note that LLE markedly weaker than RLE, but also is very limited due to pain with audible crepitus in B knees with ROM and active  movement.  Gait speed is 1.33 ft/sec without AD, indicative of increased fall risk and 5TSS is 22.53 secs with heavy reliance of UE support indicative of decreased functional strength.  Pt is of evolving presentation and moderate complexity from PT POC standpoint.  Pt will benefit from skilled OP neuro PT in order to address deficits.     Rehab Potential Good   Clinical Impairments Affecting Rehab Potential B knee arthritis   PT Frequency 2x / week   PT Duration 8 weeks   PT Treatment/Interventions ADLs/Self Care Home Management;DME Instruction;Gait training;Stair training;Functional mobility training;Therapeutic activities;Therapeutic exercise;Balance training;Neuromuscular re-education;Patient/family education;Vestibular   PT Next Visit Plan BERG, initiate HEP for BLE strengthening (strengthening around B knees in pain free range), balance   Consulted and Agree with Plan of Care Patient      Patient will benefit from skilled therapeutic intervention in order to improve the following deficits and impairments:  Decreased balance, Decreased endurance, Decreased knowledge of use of DME, Decreased mobility, Decreased range of motion, Decreased safety awareness, Decreased strength, Difficulty walking, Impaired perceived functional ability, Impaired flexibility, Impaired sensation, Improper body mechanics, Pain (not directly addressing pain in goals as this is from arthritis)  Visit Diagnosis: Other abnormalities of gait and mobility - Plan: PT plan of care cert/re-cert  Muscle weakness (generalized) - Plan: PT plan of care cert/re-cert  Unsteadiness on feet - Plan: PT plan of care cert/re-cert       G-Codes - 10/20/16 1109    Functional Assessment Tool Used Gait speed 1.33 ft/sec without AD at min/guard level, 5TSS 22.53 with BUE support   Functional Limitation Mobility: Walking and moving around   Mobility: Walking and Moving Around Current Status [id number]) At least 40 percent but less than 60  percent impaired, limited or restricted   Mobility: Walking and Moving Around Goal Status [phone]) At least 1 percent but less than 20 percent impaired, limited or restricted      Problem List Patient Active Problem List   Diagnosis Date Noted  . Small fiber neuropathy ([place]) 09/03/2016  . Compression fracture of thoracic vertebra ([facility]) 06/21/2016  . MGUS (monoclonal gammopathy of unknown significance) 02/16/2016  . Quality of life palliative care encounter 02/16/2016  . Elevated blood pressure 02/16/2016  . Polyneuropathy ([place]) 01/15/2016  . Neuropathy ([place]) 01/10/2016  . Occipital stroke ([place]) 02/21/2015    Cameron Sprang, PT, MPT [facility] [address] [place], [place], [address] Phone: [phone]   Fax:  [phone] Oct 20, 2016, 11:12 AM  Name: Patricia [name] MRN: [id number] Date of Birth: [birth year]

## 2016-10-02 ENCOUNTER — Ambulatory Visit: Payer: Medicare Other | Admitting: Physical Therapy

## 2016-10-09 ENCOUNTER — Encounter: Payer: Self-pay | Admitting: Physical Therapy

## 2016-10-09 ENCOUNTER — Ambulatory Visit: Payer: Medicare Other | Attending: Neurology | Admitting: Physical Therapy

## 2016-10-09 DIAGNOSIS — R2681 Unsteadiness on feet: Secondary | ICD-10-CM

## 2016-10-09 DIAGNOSIS — M6281 Muscle weakness (generalized): Secondary | ICD-10-CM | POA: Insufficient documentation

## 2016-10-09 DIAGNOSIS — R2689 Other abnormalities of gait and mobility: Secondary | ICD-10-CM | POA: Diagnosis present

## 2016-10-09 NOTE — Therapy (Signed)
[facility] [address] [place], [place], [address] Phone: [phone]   Fax:  [phone]  Physical Therapy Treatment  Patient Details  Name: Patricia [name] MRN: [id number] Date of Birth: [birth year] Referring Provider: Sarina Ill, MD  Encounter Date: 10/09/2016      PT End of Session - 10/09/16 1507    Visit Number 2   Number of Visits 17   Date for PT Re-Evaluation 11/19/16   Authorization Type [employer] [place] on every 10th visit   PT Start Time 1502   PT Stop Time 1543   PT Time Calculation (min) 41 min   Activity Tolerance Patient limited by pain   Behavior During Therapy [facility] for tasks assessed/performed      Past Medical History:  Diagnosis Date  . Hypothyroid   . Stroke [facility])     Past Surgical History:  Procedure Laterality Date  . CATARACT EXTRACTION Bilateral   . EYE SURGERY Left 02/15/15    Laser surgery     There were no vitals filed for this visit.      Subjective Assessment - 10/09/16 1504    Subjective Having some soreness over center of her chest (breast bone) where she landed on her printer over the weekend (was lifting the printer up from floor to put back onto her dining room table when the table cover slipped causing printer to slide and she hit her chest on it when reaching for it). Also had injection into left knee yesterday at [facility], it feels fine today.    Limitations House hold activities;Walking;Standing   Patient Stated Goals "I want to work on my balance."    Currently in Pain? No/denies   Pain Score 0-No pain            [facility] PT Assessment - 10/09/16 1509      Standardized Balance Assessment   Standardized Balance Assessment Berg Balance Test     Berg Balance Test   Sit to Stand Able to stand  independently using hands   Standing Unsupported Able to stand safely 2 minutes   Sitting with Back Unsupported but Feet Supported on Floor or Stool Able to sit  safely and securely 2 minutes   Stand to Sit Controls descent by using hands   Transfers Able to transfer safely, definite need of hands   Standing Unsupported with Eyes Closed Able to stand 10 seconds with supervision   Standing Ubsupported with Feet Together Able to place feet together independently and stand for 1 minute with supervision   From Standing, Reach Forward with Outstretched Arm Can reach forward >12 cm safely (5")  6 inches   From Standing Position, Pick up Object from Floor Able to pick up shoe safely and easily   From Standing Position, Turn to Look Behind Over each Shoulder Looks behind one side only/other side shows less weight shift  right > left   Turn 360 Degrees Able to turn 360 degrees safely but slowly  > 5 sec's each way   Standing Unsupported, Alternately Place Feet on Step/Stool Able to complete 4 steps without aid or supervision   Standing Unsupported, One Foot in Front Able to plae foot ahead of the other independently and hold 30 seconds   Standing on One Leg Able to lift leg independently and hold equal to or more than 3 seconds   Total Score 42   Berg comment: 42/56= significant (>80%) risk of falls     Issued the  following to HEP: Use counter top for balance as needed  "I love a Parade" Lift     High knee marching while walking forward and then high knee marching while walking backwards.  Repeat for 3 laps each way. 1-2 times a day.  http://gt2.exer.us/345   Copyright  VHI. All rights reserved.     Feet Heel-Toe "Tandem"    Arms as needed for balance: walk a straight line forward by bringing one foot directly in front of the other and then walk a straight line backwards by bringing one foot directly behind the other.  Repeat for 3 laps each way. 1-2 times a day.  Copyright  VHI. All rights reserved.    Walking on Toes    Walk on toes forward at counter and then backwards at counter. Repeat for 3 laps each way.  1-2 times a  day.  Copyright  VHI. All rights reserved.    Walking on Heels    Walk on heels forward along counter top and then backwards along counter top. Repeat for 3 laps.  1-2 times a day.  Copyright  VHI. All rights reserved.           PT Education - 10/09/16 1540    Education provided Yes   Education Details Furniture conservator/restorer results; HEP at counter for balance   Person(s) Educated Patient   Methods Explanation;Demonstration;Verbal cues;Handout   Comprehension Verbalized understanding;Returned demonstration          PT Short Term Goals - 09/20/16 1102      PT SHORT TERM GOAL #1   Title Pt will initiate HEP in order to indicate improved functional mobility.  (Target Date: 10/19/15)   Time 4   Period Weeks   Status New     PT SHORT TERM GOAL #2   Title Will assess BERG and improve score by 4 points in order to indicate decreased fall risk.     Time 4   Period Weeks   Status New     PT SHORT TERM GOAL #3   Title Pt will perform 5TSS in <18 secs with single UE support only in order to indicate improved functional strength.     Time 4   Period Weeks   Status New     PT SHORT TERM GOAL #4   Title Pt will demonstrate compliance with using RW at all times to decrease fall risk and improve stability with decreased knee pain.     Time 4   Period Weeks   Status New     PT SHORT TERM GOAL #5   Title Pt will improve gait speed to 1.93 ft/sec w/ LRAD in order to indicate decreased fall risk.    Time 4   Period Weeks   Status New           PT Long Term Goals - 09/20/16 1106      PT LONG TERM GOAL #1   Title The patient will be indep with HEP for high level balance, LE strength and general mobility. (Target Date: 11/15/16)   Time 8   Period Weeks   Status New     PT LONG TERM GOAL #2   Title Pt will improve BERG balance score 8 points from baseline in order to indicate decreased fall risk.     Time 8   Period Weeks   Status New     PT LONG TERM GOAL #3    Title Pt will improve gait speed to  2.53 ft/sec in order to indicate decreased fall risk and improved efficiency of gait.    Time 8   Period Weeks   Status New     PT LONG TERM GOAL #4   Title Pt will ambulate 500' over unlevel paved surfaces w/ LRAD at mod I level in order to indicate safe community mobility.     Time 8   Period Weeks   Status New     PT LONG TERM GOAL #5   Title The patient will verbalize understanding of community exercises for post d/c activities.   Time 8   Period Weeks   Status New            Plan - 10/09/16 1507    Clinical Impression Statement Todays session set baseline for [facility] Test and then established HEP for balance. Pt is making steady progress and should benefit from continued PT to progress toward unmet goals.    Rehab Potential Good   Clinical Impairments Affecting Rehab Potential B knee arthritis   PT Frequency 2x / week   PT Duration 8 weeks   PT Treatment/Interventions ADLs/Self Care Home Management;DME Instruction;Gait training;Stair training;Functional mobility training;Therapeutic activities;Therapeutic exercise;Balance training;Neuromuscular re-education;Patient/family education;Vestibular   PT Next Visit Plan BLE strengthening (strengthening around B knees in pain free range), balance   Consulted and Agree with Plan of Care Patient      Patient will benefit from skilled therapeutic intervention in order to improve the following deficits and impairments:  Decreased balance, Decreased endurance, Decreased knowledge of use of DME, Decreased mobility, Decreased range of motion, Decreased safety awareness, Decreased strength, Difficulty walking, Impaired perceived functional ability, Impaired flexibility, Impaired sensation, Improper body mechanics, Pain (not directly addressing pain in goals as this is from arthritis)  Visit Diagnosis: Other abnormalities of gait and mobility  Muscle weakness (generalized)  Unsteadiness on  feet     Problem List Patient Active Problem List   Diagnosis Date Noted  . Small fiber neuropathy ([place]) 09/03/2016  . Compression fracture of thoracic vertebra (HCC) 06/21/2016  . MGUS (monoclonal gammopathy of unknown significance) 02/16/2016  . Quality of life palliative care encounter 02/16/2016  . Elevated blood pressure 02/16/2016  . Polyneuropathy ([place]) 01/15/2016  . Neuropathy ([place]) 01/10/2016  . Occipital stroke ([place]) 02/21/2015    Willow Ora, PTA, [facility] [address], [place], [place] [address] [phone] 10/09/16, 4:23 PM  Name: Patricia [name] MRN: [id number] Date of Birth: 01/25/1930

## 2016-10-09 NOTE — Patient Instructions (Addendum)
Use counter top for balance as needed  "I love a Parade" Lift     High knee marching while walking forward and then high knee marching while walking backwards.  Repeat for 3 laps each way. 1-2 times a day.  http://gt2.exer.us/345   Copyright  VHI. All rights reserved.     Feet Heel-Toe "Tandem"    Arms as needed for balance: walk a straight line forward by bringing one foot directly in front of the other and then walk a straight line backwards by bringing one foot directly behind the other.  Repeat for 3 laps each way. 1-2 times a day.  Copyright  VHI. All rights reserved.    Walking on Toes    Walk on toes forward at counter and then backwards at counter. Repeat for 3 laps each way.  1-2 times a day.  Copyright  VHI. All rights reserved.    Walking on Heels    Walk on heels forward along counter top and then backwards along counter top. Repeat for 3 laps.  1-2 times a day.  Copyright  VHI. All rights reserved.

## 2016-10-10 IMAGING — US US CAROTID DUPLEX BILAT
1 series · 13 of 24 positions shown · non-contrast
Comparison: None.

CLINICAL DATA: TIA.  History of visual disturbance.  Headache.

EXAM:
BILATERAL CAROTID DUPLEX ULTRASOUND
TECHNIQUE: Gray scale imaging, color Doppler and duplex ultrasound were
performed of bilateral carotid and vertebral arteries in the neck.

[Series 1: us carotid duplex bilat · 0.07mm/px · 13 of 56 slices shown]
[im 1/56]
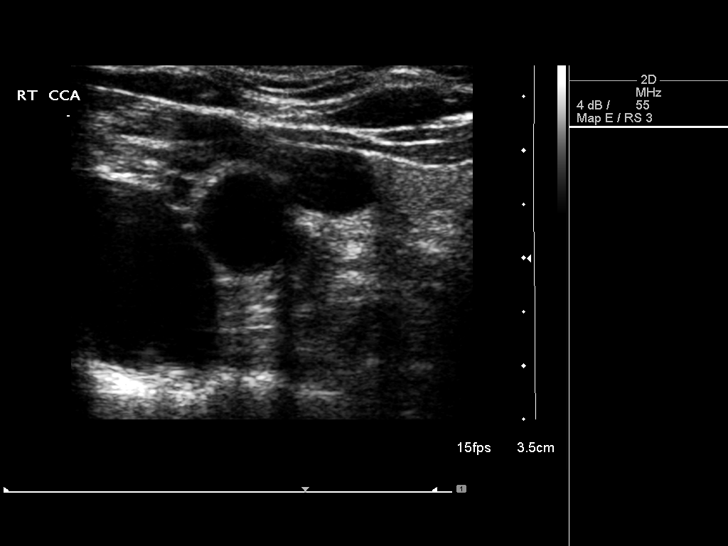
[im 5/56]
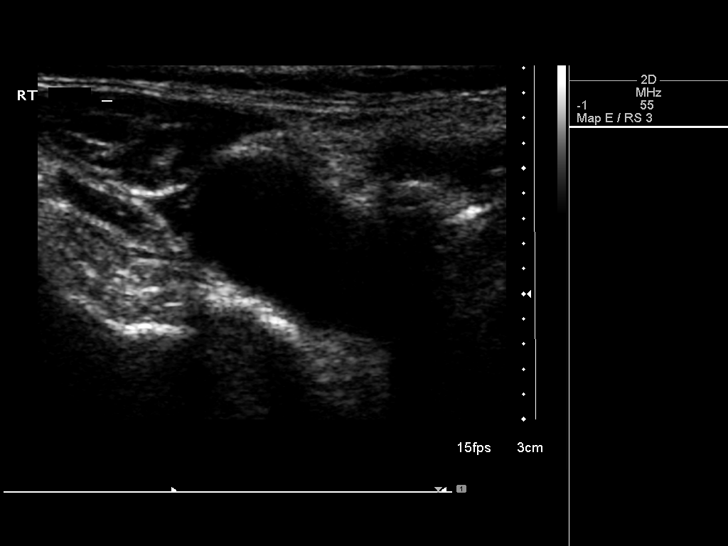
[im 10/56]
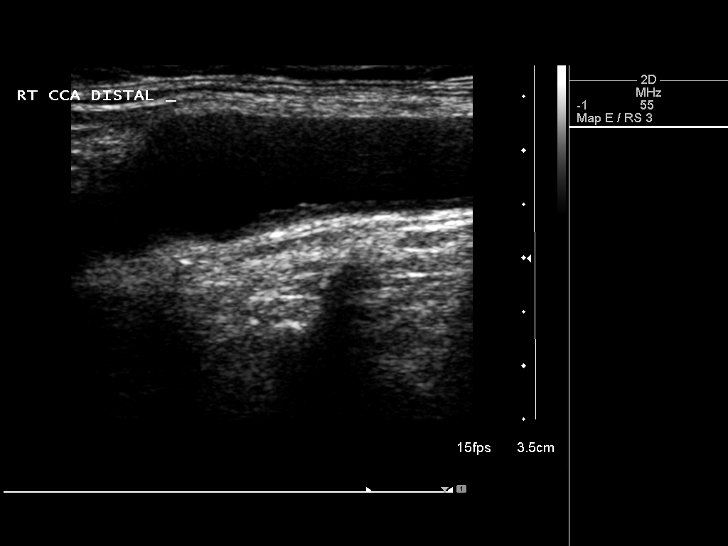
[im 15/56]
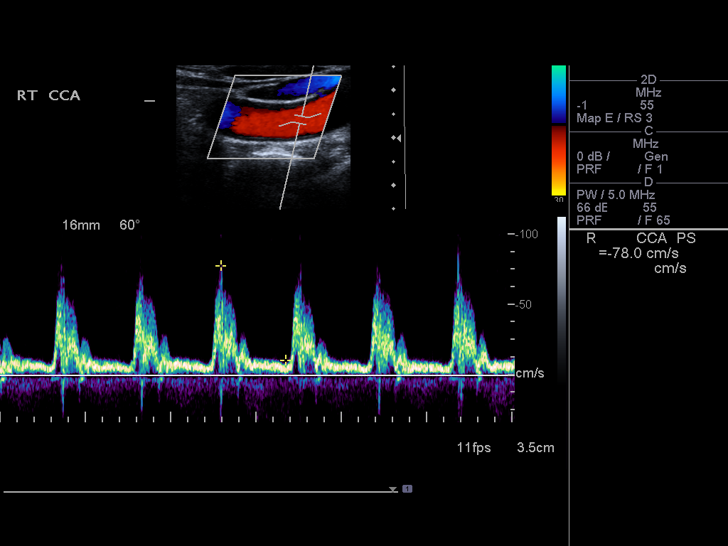
[im 20/56]
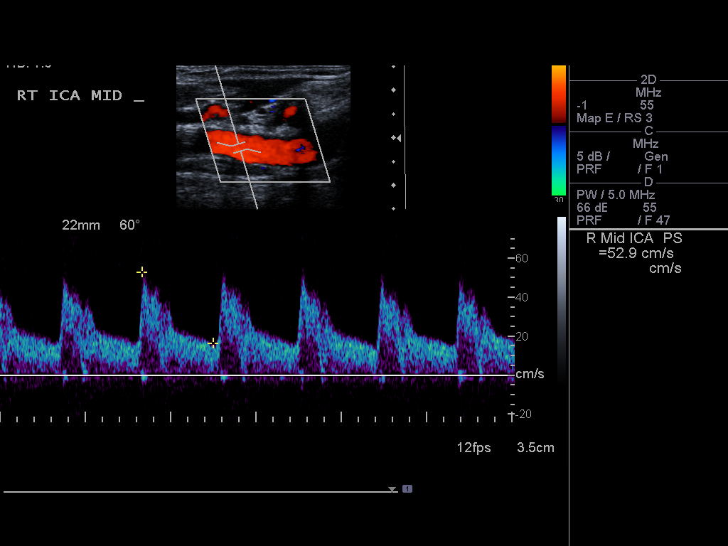
[im 24/56]
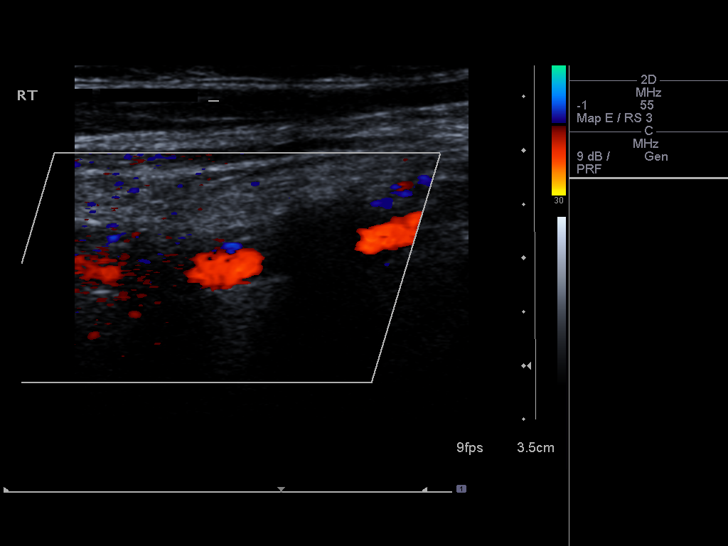
[im 29/56]
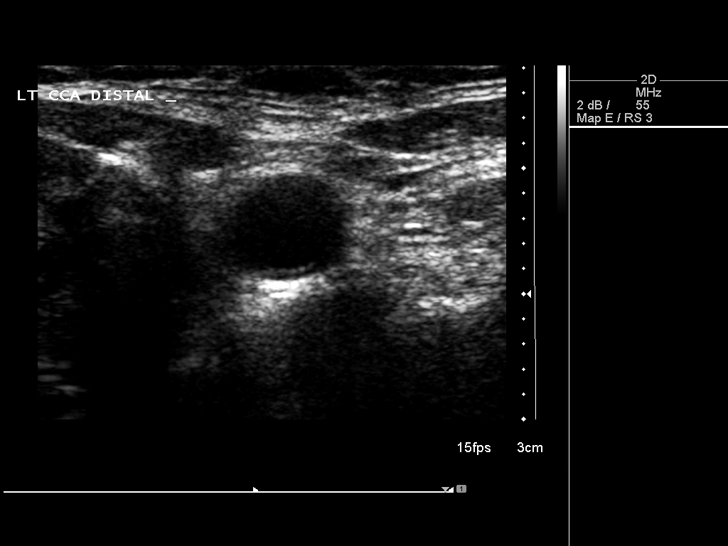
[im 32/56]
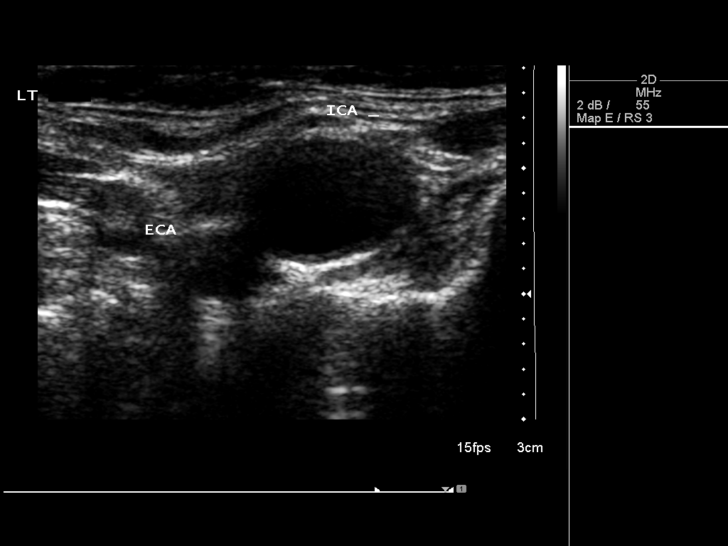
[im 36/56]
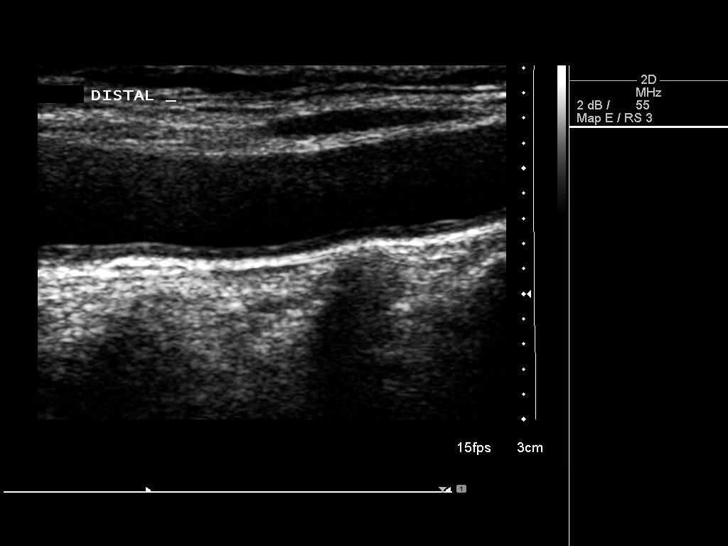
[im 41/56]
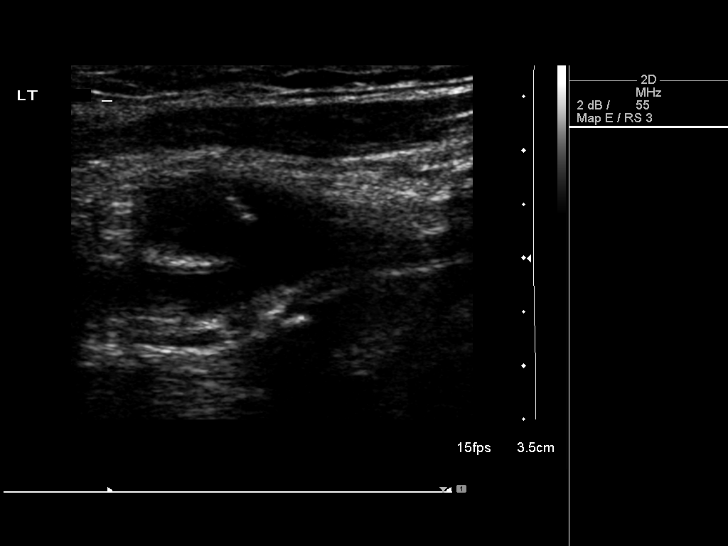
[im 46/56]
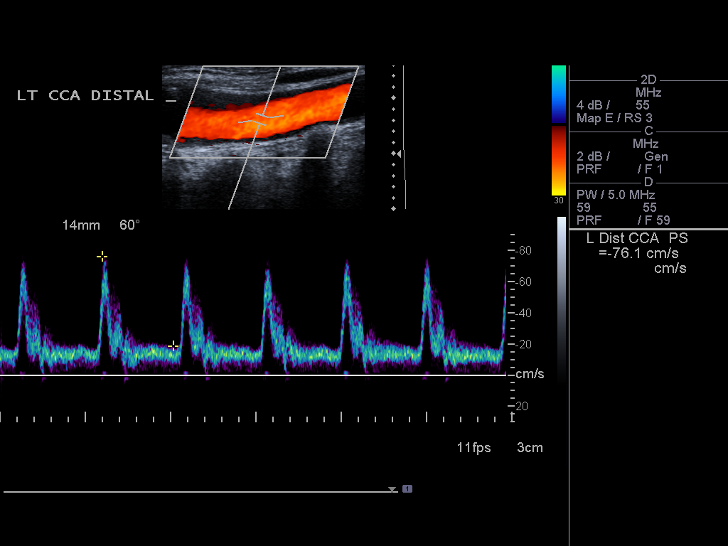
[im 51/56]
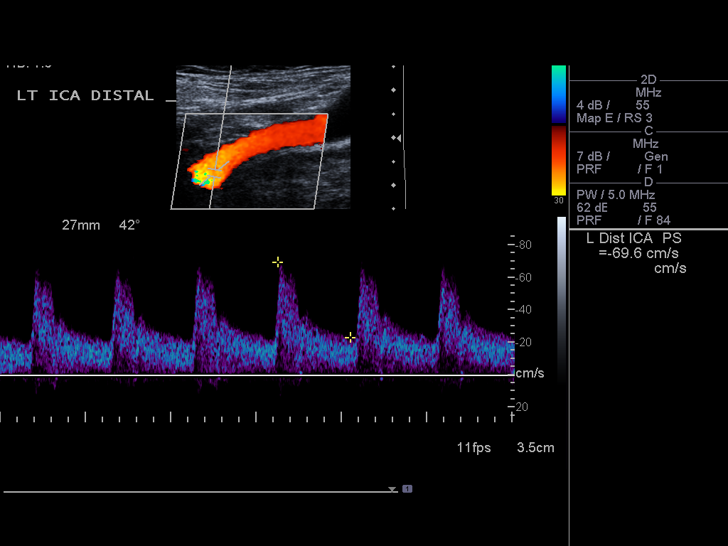
[im 56/56]
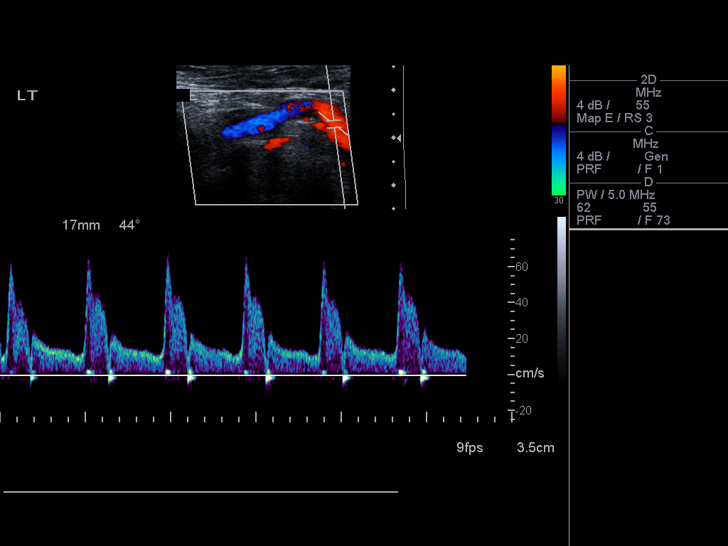

[13 of 24 positions shown; findings below may reference images not displayed]

FINDINGS: Criteria: Quantification of carotid stenosis is based on velocity
parameters that correlate the residual internal carotid diameter
with NASCET-based stenosis levels, using the diameter of the distal
internal carotid lumen as the denominator for stenosis measurement.

The following velocity measurements were obtained:

RIGHT

ICA:  54/16 cm/sec

CCA:  78/11 cm/sec

SYSTOLIC ICA/CCA RATIO:

DIASTOLIC ICA/CCA RATIO:

ECA:  70 cm/sec

LEFT

ICA:  70/23 cm/sec

CCA:  79/16 cm/sec

SYSTOLIC ICA/CCA RATIO:

DIASTOLIC ICA/CCA RATIO:

ECA:  67 cm/sec

RIGHT CAROTID ARTERY: There is a very minimal amount of eccentric
mixed echogenic plaque within the right carotid bulb (image 11), not
resulting in elevated peak systolic velocities within the
interrogated course of the right internal carotid artery to suggest
a hemodynamically significant stenosis.

RIGHT VERTEBRAL ARTERY:  Antegrade flow

LEFT CAROTID ARTERY: There is a minimal amount of eccentric mixed
echogenic plaque within the left carotid bulb (images 31, 32, 38 and
40), not resulting in elevated peak systolic velocities within the
interrogated course of the left internal carotid artery to suggest a
hemodynamically significant stenosis.

LEFT VERTEBRAL ARTERY:  Antegrade flow
IMPRESSION: Minimal amount of bilateral atherosclerotic plaque, left greater
than right, not resulting in a hemodynamically significant stenosis.

## 2016-10-11 ENCOUNTER — Encounter: Payer: Self-pay | Admitting: Physical Therapy

## 2016-10-11 ENCOUNTER — Ambulatory Visit (HOSPITAL_COMMUNITY)
Admission: EM | Admit: 2016-10-11 | Discharge: 2016-10-11 | Disposition: A | Discharge status: Home / Self Care | Payer: Medicare Other | Attending: Family Medicine | Admitting: Family Medicine

## 2016-10-11 ENCOUNTER — Ambulatory Visit: Payer: Medicare Other | Admitting: Physical Therapy

## 2016-10-11 ENCOUNTER — Encounter (HOSPITAL_COMMUNITY): Payer: Self-pay | Admitting: Emergency Medicine

## 2016-10-11 ENCOUNTER — Ambulatory Visit (INDEPENDENT_AMBULATORY_CARE_PROVIDER_SITE_OTHER): Payer: Medicare Other

## 2016-10-11 DIAGNOSIS — S20219A Contusion of unspecified front wall of thorax, initial encounter: Secondary | ICD-10-CM

## 2016-10-11 NOTE — ED Provider Notes (Signed)
[facility]    CSN: [id number] Arrival date & time: 10/11/16  1719     History   Chief Complaint Chief Complaint  Patient presents with  . Fall    HPI Patricia [name] is a 83 y.o. female.   The history is provided by the patient and a relative.  Fall  This is a new problem. The current episode started more than 1 week ago (fell forward onto chest onto printer with conitinued pain, no resp or gi or gu  sx.). The problem has not changed since onset.Associated symptoms include chest pain. Pertinent negatives include no shortness of breath. The symptoms are aggravated by twisting.    Past Medical History:  Diagnosis Date  . Hypothyroid   . Stroke [facility])     Patient Active Problem List   Diagnosis Date Noted  . Small fiber neuropathy ([place]) 09/03/2016  . Compression fracture of thoracic vertebra (HCC) 06/21/2016  . MGUS (monoclonal gammopathy of unknown significance) 02/16/2016  . Quality of life palliative care encounter 02/16/2016  . Elevated blood pressure 02/16/2016  . Polyneuropathy ([place]) 01/15/2016  . Neuropathy ([place]) 01/10/2016  . Occipital stroke ([place]) 02/21/2015    Past Surgical History:  Procedure Laterality Date  . CATARACT EXTRACTION Bilateral   . EYE SURGERY Left 02/15/15    Laser surgery     OB History    No data available       Home Medications    Prior to Admission medications   Medication Sig Start Date End Date Taking? Authorizing Provider  aspirin 81 MG chewable tablet Chew 81 mg by mouth daily.   Yes   atorvastatin (LIPITOR) 10 MG tablet Take 1 tablet (10 mg total) by mouth daily. 09/02/16  Yes Melvenia Beam, MD  Biotin 5000 MCG TABS Take 1 tablet by mouth daily.   Yes Historical Provider, MD  CALCIUM PO Take 1,000 mg by mouth daily.   Yes Historical Provider, MD  Cholecalciferol (VITAMIN D-3 PO) Take 2,000 Units by mouth 2 (two) times daily.    Yes Historical Provider, MD  cyclobenzaprine (FLEXERIL) 10 MG tablet Take 10 mg by mouth  at bedtime.   Yes Historical Provider, MD  diclofenac sodium (VOLTAREN) 1 % GEL Apply topically 4 (four) times daily.   Yes Historical Provider, MD  Flaxseed, Linseed, (FLAX SEED OIL PO) Take 1,000 mg by mouth daily.   Yes Historical Provider, MD  GLUCOSAMINE-CHONDROITIN PO Take 1,500 mg by mouth daily.   Yes Historical Provider, MD  levothyroxine (SYNTHROID, LEVOTHROID) 88 MCG tablet Take 88 mcg by mouth daily before breakfast.   Yes Historical Provider, MD  MELATONIN PO Take 5 mg by mouth at bedtime as needed.    Yes Historical Provider, MD  mirabegron ER (MYRBETRIQ) 50 MG TB24 tablet Take 50 mg by mouth daily.   Yes Historical Provider, MD  Multiple Vitamin (MULTIVITAMIN) tablet Take 1 tablet by mouth daily.   Yes Historical Provider, MD  Omega-3 Fatty Acids (FISH OIL PO) Take 1,200 mg by mouth daily.   Yes Historical Provider, MD  POTASSIUM PO Take 550 mg by mouth daily.   Yes Historical Provider, MD  Probiotic Product (PROBIOTIC DAILY PO) Take 1 tablet by mouth daily.   Yes Historical Provider, MD  sertraline (ZOLOFT) 50 MG tablet Take 25 mg by mouth daily.   Yes Historical Provider, MD  TURMERIC PO Take 750 mg by mouth daily.   Yes Historical Provider, MD  vitamin B-12 (CYANOCOBALAMIN) 1000 MCG tablet Take 1,000  mcg by mouth daily.   Yes Historical Provider, MD  vitamin C (ASCORBIC ACID) 500 MG tablet Take 500 mg by mouth daily.   Yes Historical Provider, MD  Alendronate Sodium (FOSAMAX PO) Take 1 tablet by mouth once a week.    Historical Provider, MD  polyethylene glycol (MIRALAX / GLYCOLAX) packet Take 17 g by mouth daily.    Historical Provider, MD    Family History Family History  Problem Relation Age of Onset  . Heart disease Mother   . Throat cancer Maternal Aunt   . Cancer Maternal Aunt     breast ca  . Lupus Daughter   . Anuerysm Daughter     Brain    Social History Social History  Substance Use Topics  . Smoking status: Never Smoker  . Smokeless tobacco: Never Used    . Alcohol use 0.0 oz/week     Comment: Very rarley      Allergies   Patient has no known allergies.   Review of Systems Review of Systems  Constitutional: Negative.   HENT: Negative.   Respiratory: Negative for cough, chest tightness and shortness of breath.   Cardiovascular: Positive for chest pain. Negative for palpitations and leg swelling.  Gastrointestinal: Negative.   Genitourinary: Negative.   All other systems reviewed and are negative.    Physical Exam Triage Vital Signs ED Triage Vitals [10/11/16 1808]  Enc Vitals Group     BP 165/89     Pulse Rate 79     Resp 20     Temp 98.4 F (36.9 C)     Temp Source Oral     SpO2 100 %     Weight      Height      Head Circumference      Peak Flow      Pain Score      Pain Loc      Pain Edu?      Excl. in [place]?    No data found.   Updated Vital Signs BP 165/89 (BP Location: Left Arm)   Pulse 79   Temp 98.4 F (36.9 C) (Oral)   Resp 20   SpO2 100%   Visual Acuity Right Eye Distance:   Left Eye Distance:   Bilateral Distance:    Right Eye Near:   Left Eye Near:    Bilateral Near:     Physical Exam  Constitutional: She appears well-developed and well-nourished.  Cardiovascular: Normal rate and regular rhythm.   Pulmonary/Chest: Effort normal and breath sounds normal. She exhibits tenderness.  Abdominal: Soft. Bowel sounds are normal.  Skin: Skin is warm and dry.  Nursing note and vitals reviewed.    UC Treatments / Results  Labs (all labs ordered are listed, but only abnormal results are displayed) Labs Reviewed - No data to display  EKG  EKG Interpretation None       Radiology No results found. X-rays reviewed and report per radiologist.  Procedures Procedures (including critical care time)  Medications Ordered in UC Medications - No data to display   Initial Impression / Assessment and Plan / UC Course  I have reviewed the triage vital signs and the nursing notes.  Pertinent  labs & imaging results that were available during my care of the patient were reviewed by me and considered in my medical decision making (see chart for details).  Clinical Course       Final Clinical Impressions(s) / UC Diagnoses   Final diagnoses:  None    New Prescriptions New Prescriptions   No medications on file     Billy Fischer, MD 10/23/16 1325

## 2016-10-11 NOTE — ED Triage Notes (Signed)
Pt reports she inj her chest about 8 days ago  States she was setting down a printer on a table when she slipped and fell on top of the printer  Pain increases w/activity, cough, sneezing  A&O x4... NAD

## 2016-10-11 NOTE — Discharge Instructions (Signed)
Heat , tylenol or advil with activity as tolerated.

## 2016-10-11 NOTE — Therapy (Signed)
[facility] [address] [place], [place], [address] Phone: [phone]   Fax:  [phone]  Physical Therapy Treatment  Patient Details  Name: Patricia [name] MRN: [id number] Date of Birth: 08/06/1930 Referring Provider: Sarina Ill, MD  Encounter Date: 10/11/2016      PT End of Session - 10/11/16 1605    Visit Number 2  visit cancelled-number did not change   Number of Visits 17   Date for PT Re-Evaluation 11/19/16   Authorization Type [employer] [place] on every 10th visit   PT Start Time 1532   PT Stop Time 1540  no charge   PT Time Calculation (min) 8 min   Activity Tolerance Patient limited by pain      Past Medical History:  Diagnosis Date  . Hypothyroid   . Stroke [facility])     Past Surgical History:  Procedure Laterality Date  . CATARACT EXTRACTION Bilateral   . EYE SURGERY Left 02/15/15    Laser surgery     There were no vitals filed for this visit.      Subjective Assessment - 10/11/16 1539    Subjective Still having pain/sorness in her chest/across her sternum, especially with weight bearing throught arms/leaning forward from falling onto printer ~2 weeks ago.    Limitations House hold activities;Walking;Standing   Patient Stated Goals "I want to work on my balance."    Currently in Pain? Yes   Pain Score 2    Pain Location Sternum  across chest   Pain Orientation Medial   Pain Descriptors / Indicators Sore;Sharp  sharp with UE weight bearing   Pain Type Acute pain   Pain Onset 1 to 4 weeks ago   Pain Frequency Intermittent   Aggravating Factors  weight bearing through arms, leaning forward   Pain Relieving Factors rest              PT Short Term Goals - 09/20/16 1102      PT SHORT TERM GOAL #1   Title Pt will initiate HEP in order to indicate improved functional mobility.  (Target Date: 10/19/15)   Time 4   Period Weeks   Status New     PT SHORT TERM GOAL #2   Title Will assess  BERG and improve score by 4 points in order to indicate decreased fall risk.     Time 4   Period Weeks   Status New     PT SHORT TERM GOAL #3   Title Pt will perform 5TSS in <18 secs with single UE support only in order to indicate improved functional strength.     Time 4   Period Weeks   Status New     PT SHORT TERM GOAL #4   Title Pt will demonstrate compliance with using RW at all times to decrease fall risk and improve stability with decreased knee pain.     Time 4   Period Weeks   Status New     PT SHORT TERM GOAL #5   Title Pt will improve gait speed to 1.93 ft/sec w/ LRAD in order to indicate decreased fall risk.    Time 4   Period Weeks   Status New           PT Long Term Goals - 09/20/16 1106      PT LONG TERM GOAL #1   Title The patient will be indep with HEP for high level balance, LE strength and general mobility. (Target  Date: 11/15/16)   Time 8   Period Weeks   Status New     PT LONG TERM GOAL #2   Title Pt will improve BERG balance score 8 points from baseline in order to indicate decreased fall risk.     Time 8   Period Weeks   Status New     PT LONG TERM GOAL #3   Title Pt will improve gait speed to 2.53 ft/sec in order to indicate decreased fall risk and improved efficiency of gait.    Time 8   Period Weeks   Status New     PT LONG TERM GOAL #4   Title Pt will ambulate 500' over unlevel paved surfaces w/ LRAD at mod I level in order to indicate safe community mobility.     Time 8   Period Weeks   Status New     PT LONG TERM GOAL #5   Title The patient will verbalize understanding of community exercises for post d/c activities.   Time 8   Period Weeks   Status New           Plan - 10/11/16 [id number]    Clinical Impression Statement discussed pt's continued chest/sternum pain with primary PT who agreed pt should have this checked out before continuing with PT. Pt called her daughter who is coming to pick her up and take her to be checked out.  Today's session cancelled.   Rehab Potential Good   Clinical Impairments Affecting Rehab Potential B knee arthritis   PT Frequency 2x / week   PT Duration 8 weeks   PT Treatment/Interventions ADLs/Self Care Home Management;DME Instruction;Gait training;Stair training;Functional mobility training;Therapeutic activities;Therapeutic exercise;Balance training;Neuromuscular re-education;Patient/family education;Vestibular   PT Next Visit Plan BLE strengthening (strengthening around B knees in pain free range), balance   Consulted and Agree with Plan of Care Patient      Patient will benefit from skilled therapeutic intervention in order to improve the following deficits and impairments:  Decreased balance, Decreased endurance, Decreased knowledge of use of DME, Decreased mobility, Decreased range of motion, Decreased safety awareness, Decreased strength, Difficulty walking, Impaired perceived functional ability, Impaired flexibility, Impaired sensation, Improper body mechanics, Pain (not directly addressing pain in goals as this is from arthritis)  Visit Diagnosis: Other abnormalities of gait and mobility  Muscle weakness (generalized)  Unsteadiness on feet     Problem List Patient Active Problem List   Diagnosis Date Noted  . Small fiber neuropathy ([place]) 09/03/2016  . Compression fracture of thoracic vertebra (HCC) 06/21/2016  . MGUS (monoclonal gammopathy of unknown significance) 02/16/2016  . Quality of life palliative care encounter 02/16/2016  . Elevated blood pressure 02/16/2016  . Polyneuropathy ([place]) 01/15/2016  . Neuropathy ([place]) 01/10/2016  . Occipital stroke ([place]) 02/21/2015    Willow Ora, PTA, [facility] [address] [place], [place] [address] [phone] 10/11/16, 4:07 PM   Name: Patricia [name] MRN: [id number] Date of Birth: 1929-12-08

## 2016-10-14 ENCOUNTER — Encounter: Payer: Self-pay | Admitting: Physical Therapy

## 2016-10-14 ENCOUNTER — Ambulatory Visit: Payer: Medicare Other | Admitting: Physical Therapy

## 2016-10-14 DIAGNOSIS — R2689 Other abnormalities of gait and mobility: Secondary | ICD-10-CM | POA: Diagnosis not present

## 2016-10-14 DIAGNOSIS — M6281 Muscle weakness (generalized): Secondary | ICD-10-CM

## 2016-10-14 DIAGNOSIS — R2681 Unsteadiness on feet: Secondary | ICD-10-CM

## 2016-10-14 NOTE — Therapy (Signed)
[facility] [address] [place], [place], [address] Phone: [phone]   Fax:  [phone]  Physical Therapy Treatment  Patient Details  Name: Patricia [name] MRN: [id number] Date of Birth: [birth year] Referring Provider: Sarina Ill, MD  Encounter Date: 10/14/2016      PT End of Session - 10/14/16 1023    Visit Number 3   Number of Visits 17   Date for PT Re-Evaluation 11/19/16   Authorization Type [employer] [place] on every 10th visit   PT Start Time 1018   PT Stop Time 1100   PT Time Calculation (min) 42 min   Activity Tolerance Patient limited by pain      Past Medical History:  Diagnosis Date  . Hypothyroid   . Stroke [facility])     Past Surgical History:  Procedure Laterality Date  . CATARACT EXTRACTION Bilateral   . EYE SURGERY Left 02/15/15    Laser surgery     There were no vitals filed for this visit.      Subjective Assessment - 10/14/16 1022    Subjective Was seen by an Md and cleared of chest/sternum fractures, he feels it's just bruising. Has been using heat and it feels better. No falls or current pain to report.    Limitations House hold activities;Walking;Standing   Patient Stated Goals "I want to work on my balance."    Currently in Pain? No/denies   Pain Score 0-No pain           [facility] Adult PT Treatment/Exercise - 10/14/16 1025      Transfers   Number of Reps 10 reps;1 set   Comments feet across blue foam beam with light UE assist, min assist for balance with cues to come up into full upright posture and for use of arms to controll descent with sitting down.     High Level Balance   High Level Balance Activities Tandem walking;Marching forwards;Marching backwards  tandem fwd/bwd, toe walking fwd/bwd, heel walking fwd/bwd   High Level Balance Comments both red mats next to counter top: x 3 laps each/each way with light UE support on counter, min guard to min assist for balance, cues on  posture, ex form/technique and weight shifting for balance                               Balance Exercises - 10/14/16 1034      Balance Exercises: Standing   SLS with Vectors Foam/compliant surface;Limitations   Balance Beam standing across blue foam beam: alternating fwd stepping to floor and back onto beam x 10 each leg, alternating bwd stepping to floor and back onto beam x 10 reps each leg, min to mod assist for balance. cues on posture, weight shifting with stepping and for increased step height on return to clear beam surface.     Balance Exercises: Standing   SLS with Vectors Limitations 6 cones along edges of red mats: alternating toe tap to each one with side stepping left<>right, alternating double toe tapping to each one with side stepping left<>right and alternating flipping over/up with each one with side stepping left<>right, 1 lap each with min to mod assist for balance, no UE support and cues on ex form, weight shifting and stance position to assist with balance.             PT Short Term Goals - 09/20/16 1102  PT SHORT TERM GOAL #1   Title Pt will initiate HEP in order to indicate improved functional mobility.  (Target Date: 10/19/15)   Time 4   Period Weeks   Status New     PT SHORT TERM GOAL #2   Title Will assess BERG and improve score by 4 points in order to indicate decreased fall risk.     Time 4   Period Weeks   Status New     PT SHORT TERM GOAL #3   Title Pt will perform 5TSS in <18 secs with single UE support only in order to indicate improved functional strength.     Time 4   Period Weeks   Status New     PT SHORT TERM GOAL #4   Title Pt will demonstrate compliance with using RW at all times to decrease fall risk and improve stability with decreased knee pain.     Time 4   Period Weeks   Status New     PT SHORT TERM GOAL #5   Title Pt will improve gait speed to 1.93 ft/sec w/ LRAD in order to indicate decreased fall risk.    Time 4    Period Weeks   Status New           PT Long Term Goals - 09/20/16 1106      PT LONG TERM GOAL #1   Title The patient will be indep with HEP for high level balance, LE strength and general mobility. (Target Date: 11/15/16)   Time 8   Period Weeks   Status New     PT LONG TERM GOAL #2   Title Pt will improve BERG balance score 8 points from baseline in order to indicate decreased fall risk.     Time 8   Period Weeks   Status New     PT LONG TERM GOAL #3   Title Pt will improve gait speed to 2.53 ft/sec in order to indicate decreased fall risk and improved efficiency of gait.    Time 8   Period Weeks   Status New     PT LONG TERM GOAL #4   Title Pt will ambulate 500' over unlevel paved surfaces w/ LRAD at mod I level in order to indicate safe community mobility.     Time 8   Period Weeks   Status New     PT LONG TERM GOAL #5   Title The patient will verbalize understanding of community exercises for post d/c activities.   Time 8   Period Weeks   Status New            Plan - 10/14/16 1024    Clinical Impression Statement today's skilled session focused on high level balance activities with pt most challenged on complaint surfaces and with single leg stance activities. Pt is making steady progress toward goals and should benefit from continued PT to progress toward unmet goals.    Rehab Potential Good   Clinical Impairments Affecting Rehab Potential B knee arthritis   PT Frequency 2x / week   PT Duration 8 weeks   PT Treatment/Interventions ADLs/Self Care Home Management;DME Instruction;Gait training;Stair training;Functional mobility training;Therapeutic activities;Therapeutic exercise;Balance training;Neuromuscular re-education;Patient/family education;Vestibular   PT Next Visit Plan BLE strengthening (strengthening around B knees in pain free range), balance   Consulted and Agree with Plan of Care Patient      Patient will benefit from skilled therapeutic  intervention in order to improve the following deficits and  impairments:  Decreased balance, Decreased endurance, Decreased Patricia of use of DME, Decreased mobility, Decreased range of motion, Decreased safety awareness, Decreased strength, Difficulty walking, Impaired perceived functional ability, Impaired flexibility, Impaired sensation, Improper body mechanics, Pain (not directly addressing pain in goals as this is from arthritis)  Visit Diagnosis: Other abnormalities of gait and mobility  Muscle weakness (generalized)  Unsteadiness on feet     Problem List Patient Active Problem List   Diagnosis Date Noted  . Small fiber neuropathy ([place]) 09/03/2016  . Compression fracture of thoracic vertebra (HCC) 06/21/2016  . MGUS (monoclonal gammopathy of unknown significance) 02/16/2016  . Quality of life palliative care encounter 02/16/2016  . Elevated blood pressure 02/16/2016  . Polyneuropathy ([place]) 01/15/2016  . Neuropathy ([place]) 01/10/2016  . Occipital stroke ([place]) 02/21/2015    Willow Ora, PTA, [facility] [address], [place], [place] [address] [phone] 10/14/16, 4:12 PM   Name: Ganell Galanos MRN: [id number] Date of Birth: May 01, 1930

## 2016-10-16 ENCOUNTER — Ambulatory Visit: Payer: Medicare Other | Admitting: Physical Therapy

## 2016-10-16 ENCOUNTER — Encounter: Payer: Self-pay | Admitting: Physical Therapy

## 2016-10-16 DIAGNOSIS — R2689 Other abnormalities of gait and mobility: Secondary | ICD-10-CM | POA: Diagnosis not present

## 2016-10-16 DIAGNOSIS — R2681 Unsteadiness on feet: Secondary | ICD-10-CM

## 2016-10-16 DIAGNOSIS — M6281 Muscle weakness (generalized): Secondary | ICD-10-CM

## 2016-10-16 NOTE — Therapy (Signed)
[facility] [address] [place], [place], [address] Phone: [phone]   Fax:  [phone]  Physical Therapy Treatment  Patient Details  Name: Patricia [name] MRN: [id number] Date of Birth: 10/30/1929 Referring Provider: Sarina Ill, MD  Encounter Date: 10/16/2016      PT End of Session - 10/16/16 1131    Visit Number 4   Number of Visits 17   Date for PT Re-Evaluation 11/19/16   Authorization Type [employer] [place] on every 10th visit   PT Start Time 1019   PT Stop Time 1101   PT Time Calculation (min) 42 min   Equipment Utilized During Treatment Gait belt   Activity Tolerance Patient tolerated treatment well;No increased pain      Past Medical History:  Diagnosis Date  . Hypothyroid   . Stroke [facility])     Past Surgical History:  Procedure Laterality Date  . CATARACT EXTRACTION Bilateral   . EYE SURGERY Left 02/15/15    Laser surgery     There were no vitals filed for this visit.      Subjective Assessment - 10/16/16 1021    Subjective Pt has worked on HEP some but has been focusing on getting chest better; pt has had a "shot" on both knees a couple of weeks ago and really hasn't had pain in her knees since the shots.   Limitations House hold activities;Walking;Standing   Patient Stated Goals "I want to work on my balance."    Currently in Pain? No/denies            [facility] PT Assessment - 10/16/16 0001      Transfers   Five time sit to stand comments  14.72 seconds with 1 UE support     Ambulation/Gait   Ambulation/Gait Yes   Gait velocity 2.87 ft/sec without AD     Berg Balance Test   Sit to Stand Able to stand  independently using hands   Standing Unsupported Able to stand safely 2 minutes   Sitting with Back Unsupported but Feet Supported on Floor or Stool Able to sit safely and securely 2 minutes   Stand to Sit Sits safely with minimal use of hands   Transfers Able to transfer safely, minor  use of hands   Standing Unsupported with Eyes Closed Able to stand 10 seconds safely   Standing Ubsupported with Feet Together Able to place feet together independently but unable to hold for 30 seconds   From Standing, Reach Forward with Outstretched Arm Can reach confidently >25 cm (10")   From Standing Position, Pick up Object from Floor Able to pick up shoe safely and easily   From Standing Position, Turn to Look Behind Over each Shoulder Looks behind from both sides and weight shifts well   Turn 360 Degrees Able to turn 360 degrees safely but slowly   Standing Unsupported, Alternately Place Feet on Step/Stool Able to stand independently and safely and complete 8 steps in 20 seconds   Standing Unsupported, One Foot in Front Able to plae foot ahead of the other independently and hold 30 seconds   Standing on One Leg Able to lift leg independently and hold 5-10 seconds   Total Score 49                     [facility] Adult PT Treatment/Exercise - 10/16/16 0001      Ambulation/Gait   Ambulation/Gait Assistance 6: Modified independent (Device/Increase time)   Assistive  device None   Gait Pattern Decreased arm swing - left;Decreased step length - right;Decreased stance time - left;Decreased arm swing - right;Decreased stride length;Decreased weight shift to left;Lateral hip instability   Ambulation Surface Level;Indoor   Ramp 5: Supervision   Ramp Details (indicate cue type and reason) slow speed; no device   Curb 5: Supervision   Curb Details (indicate cue type and reason) slow speed; no device        Standing balance on ramp (facing uphill): Progressing from wide BOS to staggered stance with head turns and weight shifting Min guard A.          PT Education - 10/16/16 1027    Education provided Yes   Education Details Discussed goals checked, including STG#4 pt has not had pain in knees since orthopedic shots and has not used a RW; discussed scheduling. Discussed  performance of HEP and made recommendations to walking program (to keep better track of time to progress tolerance).  Discussed possible continuing fitness options with family or friends.   Person(s) Educated Patient   Methods Explanation   Comprehension Verbalized understanding          PT Short Term Goals - 10/16/16 1132      PT SHORT TERM GOAL #1   Title Pt will initiate HEP in order to indicate improved functional mobility.  (Target Date: 10/19/15)   Baseline Met, 10/16/16.   Time 4   Period Weeks   Status Achieved     PT SHORT TERM GOAL #2   Title Will assess BERG and improve score by 4 points in order to indicate decreased fall risk.     Baseline Scored 49/56; inital score on eval was 42/56; 10/16/16.   Time 4   Period Weeks   Status Achieved     PT SHORT TERM GOAL #3   Title Pt will perform 5TSS in <18 secs with single UE support only in order to indicate improved functional strength.     Baseline 14.72 seconds with 1 UE support; 10/16/16.   Time 4   Period Weeks   Status Achieved     PT SHORT TERM GOAL #4   Title Pt will demonstrate compliance with using RW at all times to decrease fall risk and improve stability with decreased knee pain.     Baseline Pt has not had pain in knees since orthopedic shots and has not used a RW at home or in the community; 10/16/16.   Time 4   Period Weeks   Status Deferred     PT SHORT TERM GOAL #5   Title Pt will improve gait speed to 1.93 ft/sec w/ LRAD in order to indicate decreased fall risk.    Baseline 2.87 ft/sec, no AD; 10/16/16.   Time 4   Period Weeks   Status Achieved           PT Long Term Goals - 09/20/16 1106      PT LONG TERM GOAL #1   Title The patient will be indep with HEP for high level balance, LE strength and general mobility. (Target Date: 11/15/16)   Time 8   Period Weeks   Status New     PT LONG TERM GOAL #2   Title Pt will improve BERG balance score 8 points from baseline in order to indicate decreased  fall risk.     Time 8   Period Weeks   Status New     PT LONG TERM GOAL #3  Title Pt will improve gait speed to 2.53 ft/sec in order to indicate decreased fall risk and improved efficiency of gait.    Time 8   Period Weeks   Status New     PT LONG TERM GOAL #4   Title Pt will ambulate 500' over unlevel paved surfaces w/ LRAD at mod I level in order to indicate safe community mobility.     Time 8   Period Weeks   Status New     PT LONG TERM GOAL #5   Title The patient will verbalize understanding of community exercises for post d/c activities.   Time 8   Period Weeks   Status New               Plan - 10/16/16 1137    Clinical Impression Statement Pt is agreable to come for 4 more weeks of PT. Pt reports noting a significant improvement with her mobility in the community. Pt met all STGs progressing with standing balance, gait speed, 5TSS except for #4 (see reason above)   Rehab Potential Good   Clinical Impairments Affecting Rehab Potential B knee arthritis   PT Frequency 2x / week   PT Duration 8 weeks   PT Treatment/Interventions ADLs/Self Care Home Management;DME Instruction;Gait training;Stair training;Functional mobility training;Therapeutic activities;Therapeutic exercise;Balance training;Neuromuscular re-education;Patient/family education;Vestibular   PT Next Visit Plan BLE strengthening (strengthening around B knees in pain free range), balance compliant and uneven surfaces, stepping strategies.   Consulted and Agree with Plan of Care Patient      Patient will benefit from skilled therapeutic intervention in order to improve the following deficits and impairments:  Decreased balance, Decreased endurance, Decreased knowledge of use of DME, Decreased mobility, Decreased range of motion, Decreased safety awareness, Decreased strength, Difficulty walking, Impaired perceived functional ability, Impaired flexibility, Impaired sensation, Improper body mechanics, Pain (not  directly addressing pain in goals as this is from arthritis)  Visit Diagnosis: Other abnormalities of gait and mobility  Muscle weakness (generalized)  Unsteadiness on feet     Problem List Patient Active Problem List   Diagnosis Date Noted  . Small fiber neuropathy ([place]) 09/03/2016  . Compression fracture of thoracic vertebra (HCC) 06/21/2016  . MGUS (monoclonal gammopathy of unknown significance) 02/16/2016  . Quality of life palliative care encounter 02/16/2016  . Elevated blood pressure 02/16/2016  . Polyneuropathy ([place]) 01/15/2016  . Neuropathy ([place]) 01/10/2016  . Occipital stroke ([place]) 02/21/2015    Bjorn Loser, PTA  10/16/16, 11:42 AM [facility] [address] [place], [place], [address] Phone: [phone]   Fax:  [phone]  Name: Wenonah Milo MRN: [id number] Date of Birth: 1930/02/10

## 2016-10-21 ENCOUNTER — Ambulatory Visit: Payer: Medicare Other | Admitting: Rehabilitative and Restorative Service Providers"

## 2016-10-21 DIAGNOSIS — R2689 Other abnormalities of gait and mobility: Secondary | ICD-10-CM | POA: Diagnosis not present

## 2016-10-21 DIAGNOSIS — M6281 Muscle weakness (generalized): Secondary | ICD-10-CM

## 2016-10-21 DIAGNOSIS — R2681 Unsteadiness on feet: Secondary | ICD-10-CM

## 2016-10-21 NOTE — Patient Instructions (Addendum)
Use counter top for balance as needed  Feet Heel-Toe "Tandem"    Arms as needed for balance: walk a straight line forward by bringing one foot directly in front of the other and then walk a straight line backwards by bringing one foot directly behind the other.  Repeat for 3 laps each way. 1-2 times a day.  Copyright  VHI. All rights reserved.    Walking on Toes    Walk on toes forward at counter and then backwards at counter. Repeat for 3 laps each way.  1-2 times a day.  Copyright  VHI. All rights reserved.    Walking on Heels    Walk on heels forward along counter top and then backwards along counter top. Repeat for 3 laps.  1-2 times a day.  Copyright  VHI. All rights reserved.   Hip Flexion / Knee Extension: Straight-Leg Raise (Eccentric)    Lie on back. Lift leg with knee straight. Slowly lower leg for 3-5 seconds. 10_ reps per set, _1-2__ sets per day.   Copyright  VHI. All rights reserved.   HIP: Abduction - Side-Lying    Lie on side, bend bottom leg and keep top leg in line with trunk. Squeeze glutes. Raise top leg up and slightly back. Point toes forward. _5-10__ reps per set, _1-2__ sets per day.  **Bend bottom leg to stabilize pelvis.*  Copyright  VHI. All rights reserved.

## 2016-10-21 NOTE — Therapy (Signed)
[facility] [address] [place], [place], [address] Phone: [phone]   Fax:  [phone]  Physical Therapy Treatment  Patient Details  Name: Patricia [name] MRN: [id number] Date of Birth: [birth year] Referring Provider: Sarina Ill, MD  Encounter Date: 10/21/2016      PT End of Session - 10/21/16 1128    Visit Number 5   Number of Visits 17   Date for PT Re-Evaluation 11/19/16   Authorization Type [employer] [place] on every 10th visit   PT Start Time 1102   PT Stop Time 1144   PT Time Calculation (min) 42 min   Equipment Utilized During Treatment Gait belt   Activity Tolerance Patient tolerated treatment well;No increased pain      Past Medical History:  Diagnosis Date  . Hypothyroid   . Stroke [facility])     Past Surgical History:  Procedure Laterality Date  . CATARACT EXTRACTION Bilateral   . EYE SURGERY Left 02/15/15    Laser surgery     There were no vitals filed for this visit.      Subjective Assessment - 10/21/16 1103    Subjective The patient feels knees are still with reduced pain.  She is not doing HEP as often as recommended.    Limitations House hold activities;Walking;Standing   Patient Stated Goals "I want to work on my balance."    Currently in Pain? No/denies                         [facility] Adult PT Treatment/Exercise - 10/21/16 1112      Ambulation/Gait   Ambulation/Gait Yes   Ambulation/Gait Assistance 7: Independent   Ambulation/Gait Assistance Details Patient ambulated into clinic without device with knees flexed.   Ambulation Distance (Feet) 350 Feet   Assistive device None   Gait Comments Gait training emphasizing arm swing bilaterally, longer stride length, bilateral heel strike at initial contact.       Neuro Re-ed    Neuro Re-ed Details  Marching in place dec'ing UE support near counter x 20 reps, tandem gait with intermittent UE support through surface x 10 feet  x 3 reps, heel/toe walking near support surface x 10 feet x 4 reps, sidestepping 20 feet x 4 reps R and L side with CGA, standing stepping strategy activities holding ball overhead while stepping anterior and laterally with CGA for safety.    Rocker board standing with reaching and head motion.  Standing on compliant surfaces with increased sway emphasizing proactive balance and hip strategies.      Exercises   Exercises Knee/Hip     Knee/Hip Exercises: Supine   Straight Leg Raises 10 reps;Strengthening;Right;Left  with cues for quad set to initiate and slow movement     Knee/Hip Exercises: Sidelying   Hip ABduction 10 reps;Strengthening;Right;Left  with cues on hip position *could tolerate L side 6 reps   Hip ABduction Limitations had mild knee discomfort left after 6 reps                PT Education - 10/21/16 1143    Education provided Yes   Education Details HEP: modified HEP to remove marching, added SLR and hip abduction.   Person(s) Educated Patient   Methods Explanation;Demonstration;Handout   Comprehension Returned demonstration          PT Short Term Goals - 10/16/16 1132      PT SHORT TERM GOAL #1   Title Pt  will initiate HEP in order to indicate improved functional mobility.  (Target Date: 10/19/15)   Baseline Met, 10/16/16.   Time 4   Period Weeks   Status Achieved     PT SHORT TERM GOAL #2   Title Will assess BERG and improve score by 4 points in order to indicate decreased fall risk.     Baseline Scored 49/56; inital score on eval was 42/56; 10/16/16.   Time 4   Period Weeks   Status Achieved     PT SHORT TERM GOAL #3   Title Pt will perform 5TSS in <18 secs with single UE support only in order to indicate improved functional strength.     Baseline 14.72 seconds with 1 UE support; 10/16/16.   Time 4   Period Weeks   Status Achieved     PT SHORT TERM GOAL #4   Title Pt will demonstrate compliance with using RW at all times to decrease fall risk and  improve stability with decreased knee pain.     Baseline Pt has not had pain in knees since orthopedic shots and has not used a RW at home or in the community; 10/16/16.   Time 4   Period Weeks   Status Deferred     PT SHORT TERM GOAL #5   Title Pt will improve gait speed to 1.93 ft/sec w/ LRAD in order to indicate decreased fall risk.    Baseline 2.87 ft/sec, no AD; 10/16/16.   Time 4   Period Weeks   Status Achieved           PT Long Term Goals - 09/20/16 1106      PT LONG TERM GOAL #1   Title The patient will be indep with HEP for high level balance, LE strength and general mobility. (Target Date: 11/15/16)   Time 8   Period Weeks   Status New     PT LONG TERM GOAL #2   Title Pt will improve BERG balance score 8 points from baseline in order to indicate decreased fall risk.     Time 8   Period Weeks   Status New     PT LONG TERM GOAL #3   Title Pt will improve gait speed to 2.53 ft/sec in order to indicate decreased fall risk and improved efficiency of gait.    Time 8   Period Weeks   Status New     PT LONG TERM GOAL #4   Title Pt will ambulate 500' over unlevel paved surfaces w/ LRAD at mod I level in order to indicate safe community mobility.     Time 8   Period Weeks   Status New     PT LONG TERM GOAL #5   Title The patient will verbalize understanding of community exercises for post d/c activities.   Time 8   Period Weeks   Status New               Plan - 10/21/16 1137    Clinical Impression Statement PT added specific strengthening activities for HEP including straight leg raise and hip abduction.  Emphasized longer stride length and arm swing during gait.  PT to continue working towards [place].    PT Treatment/Interventions ADLs/Self Care Home Management;DME Instruction;Gait training;Stair training;Functional mobility training;Therapeutic activities;Therapeutic exercise;Balance training;Neuromuscular re-education;Patient/family education;Vestibular    PT Next Visit Plan BLE strengthening (strengthening around B knees in pain free range), balance compliant and uneven surfaces, stepping strategies.   Consulted and Agree with  Plan of Care Patient      Patient will benefit from skilled therapeutic intervention in order to improve the following deficits and impairments:  Decreased balance, Decreased endurance, Decreased knowledge of use of DME, Decreased mobility, Decreased range of motion, Decreased safety awareness, Decreased strength, Difficulty walking, Impaired perceived functional ability, Impaired flexibility, Impaired sensation, Improper body mechanics, Pain  Visit Diagnosis: Other abnormalities of gait and mobility  Muscle weakness (generalized)  Unsteadiness on feet     Problem List Patient Active Problem List   Diagnosis Date Noted  . Small fiber neuropathy ([place]) 09/03/2016  . Compression fracture of thoracic vertebra (HCC) 06/21/2016  . MGUS (monoclonal gammopathy of unknown significance) 02/16/2016  . Quality of life palliative care encounter 02/16/2016  . Elevated blood pressure 02/16/2016  . Polyneuropathy ([place]) 01/15/2016  . Neuropathy ([place]) 01/10/2016  . Occipital stroke ([place]) 02/21/2015    Rhyder Bratz, PT 10/21/2016, 8:08 PM  [facility] [address] [place], [place], [address] Phone: [phone]   Fax:  [phone]  Name: Patricia [name] MRN: [id number] Date of Birth: Feb 04, 1930

## 2016-10-24 ENCOUNTER — Encounter: Payer: Medicare Other | Admitting: Rehabilitative and Restorative Service Providers"

## 2016-10-24 ENCOUNTER — Ambulatory Visit: Payer: Medicare Other | Admitting: Rehabilitative and Restorative Service Providers"

## 2016-10-28 ENCOUNTER — Ambulatory Visit: Payer: Medicare Other | Admitting: Rehabilitative and Restorative Service Providers"

## 2016-10-28 DIAGNOSIS — M6281 Muscle weakness (generalized): Secondary | ICD-10-CM

## 2016-10-28 DIAGNOSIS — R2689 Other abnormalities of gait and mobility: Secondary | ICD-10-CM

## 2016-10-28 DIAGNOSIS — R2681 Unsteadiness on feet: Secondary | ICD-10-CM

## 2016-10-28 NOTE — Therapy (Signed)
[facility] [address] [place], [place], [address] Phone: [phone]   Fax:  [phone]  Physical Therapy Treatment  Patient Details  Name: Patricia [name] MRN: [id number] Date of Birth: [birth year] Referring Provider: Sarina Ill, MD  Encounter Date: 10/28/2016      PT End of Session - 10/28/16 1027    Visit Number 6   Number of Visits 17   Date for PT Re-Evaluation 11/19/16   Authorization Type [employer] [place] on every 10th visit   PT Start Time 1024   PT Stop Time 1058   PT Time Calculation (min) 34 min      Past Medical History:  Diagnosis Date  . Hypothyroid   . Stroke [facility])     Past Surgical History:  Procedure Laterality Date  . CATARACT EXTRACTION Bilateral   . EYE SURGERY Left 02/15/15    Laser surgery     There were no vitals filed for this visit.      Subjective Assessment - 10/28/16 1026    Subjective The patient was at her daughter's house during snow last week and did not get to HEP as recommended.   Limitations House hold activities;Walking;Standing   Patient Stated Goals "I want to work on my balance."    Currently in Pain? No/denies                         [facility] Adult PT Treatment/Exercise - 10/28/16 1040      Ambulation/Gait   Ambulation/Gait Yes   Ambulation/Gait Assistance 7: Independent   Ambulation Distance (Feet) 500 Feet   Assistive device None   Gait Comments Gait activities working on direction changes, starts/stops, heel/toe walking, backwards walking with intermittent CGA for support.       Self-Care   Self-Care Other Self-Care Comments   Other Self-Care Comments  Patient notes having life line; a neighbor that helps with newspaper and mail.  She notes tumblingover when bending down to get newspaper and mail and fell; she could not get up from the floor.  She had to call to get help noting now she holds on to reach down to pick up paper.  *PT to work on  picking up items from the floor and consider floor transfers depending on knee pain*     Neuro Re-ed    Neuro Re-ed Details  Marching x 100 feet with CGA and cues for leg height; rocker board standing with proactive reaching and rocker board with head motion with intermittent min A for losses of balance;; sidestepping 10 ft x 6 reps, 25 ft with UEs engaged emphasizing core stabilization during task; sitting on physioball with posture re-education with marching and alternating leg kicks     Exercises   Exercises Other Exercises   Other Exercises  seated hamstring stretch, sit<>stand x 10 with reaching up on toes while reaching overhead., standing heel raises x 20 reps and toe raises x 20 reps with UE support; hamstring/knee flexion standing x 10 reps with UE support.                   PT Short Term Goals - 10/16/16 1132      PT SHORT TERM GOAL #1   Title Pt will initiate HEP in order to indicate improved functional mobility.  (Target Date: 10/19/15)   Baseline Met, 10/16/16.   Time 4   Period Weeks   Status Achieved     PT SHORT  TERM GOAL #2   Title Will assess BERG and improve score by 4 points in order to indicate decreased fall risk.     Baseline Scored 49/56; inital score on eval was 42/56; 10/16/16.   Time 4   Period Weeks   Status Achieved     PT SHORT TERM GOAL #3   Title Pt will perform 5TSS in <18 secs with single UE support only in order to indicate improved functional strength.     Baseline 14.72 seconds with 1 UE support; 10/16/16.   Time 4   Period Weeks   Status Achieved     PT SHORT TERM GOAL #4   Title Pt will demonstrate compliance with using RW at all times to decrease fall risk and improve stability with decreased knee pain.     Baseline Pt has not had pain in knees since orthopedic shots and has not used a RW at home or in the community; 10/16/16.   Time 4   Period Weeks   Status Deferred     PT SHORT TERM GOAL #5   Title Pt will improve gait speed to  1.93 ft/sec w/ LRAD in order to indicate decreased fall risk.    Baseline 2.87 ft/sec, no AD; 10/16/16.   Time 4   Period Weeks   Status Achieved           PT Long Term Goals - 09/20/16 1106      PT LONG TERM GOAL #1   Title The patient will be indep with HEP for high level balance, LE strength and general mobility. (Target Date: 11/15/16)   Time 8   Period Weeks   Status New     PT LONG TERM GOAL #2   Title Pt will improve BERG balance score 8 points from baseline in order to indicate decreased fall risk.     Time 8   Period Weeks   Status New     PT LONG TERM GOAL #3   Title Pt will improve gait speed to 2.53 ft/sec in order to indicate decreased fall risk and improved efficiency of gait.    Time 8   Period Weeks   Status New     PT LONG TERM GOAL #4   Title Pt will ambulate 500' over unlevel paved surfaces w/ LRAD at mod I level in order to indicate safe community mobility.     Time 8   Period Weeks   Status New     PT LONG TERM GOAL #5   Title The patient will verbalize understanding of community exercises for post d/c activities.   Time 8   Period Weeks   Status New               Plan - 10/28/16 1104    Clinical Impression Statement The patient is not yet participating in HEP- PT emphasizing improved compliance.  Plan to work on transfers floor<>stand with UE support and bending to pick up items.    PT Treatment/Interventions ADLs/Self Care Home Management;DME Instruction;Gait training;Stair training;Functional mobility training;Therapeutic activities;Therapeutic exercise;Balance training;Neuromuscular re-education;Patient/family education;Vestibular   PT Next Visit Plan Floor transfers, bending to pick up items, HEP additions if needed; LE strengthening, compliant surface standing, unlevel surfaces and stepping strategies.   Consulted and Agree with Plan of Care Patient      Patient will benefit from skilled therapeutic intervention in order to improve  the following deficits and impairments:  Decreased balance, Decreased endurance, Decreased knowledge of use of DME,  Decreased mobility, Decreased range of motion, Decreased safety awareness, Decreased strength, Difficulty walking, Impaired perceived functional ability, Impaired flexibility, Impaired sensation, Improper body mechanics, Pain  Visit Diagnosis: Other abnormalities of gait and mobility  Muscle weakness (generalized)  Unsteadiness on feet     Problem List Patient Active Problem List   Diagnosis Date Noted  . Small fiber neuropathy ([place]) 09/03/2016  . Compression fracture of thoracic vertebra (HCC) 06/21/2016  . MGUS (monoclonal gammopathy of unknown significance) 02/16/2016  . Quality of life palliative care encounter 02/16/2016  . Elevated blood pressure 02/16/2016  . Polyneuropathy ([place]) 01/15/2016  . Neuropathy ([place]) 01/10/2016  . Occipital stroke ([place]) 02/21/2015    Riane Rung, PT 10/28/2016, 11:05 AM  [facility] [address] [place], [place], [address] Phone: [phone]   Fax:  [phone]  Name: Jovonna Nickell MRN: [id number] Date of Birth: 1929/12/06

## 2016-10-30 ENCOUNTER — Ambulatory Visit: Payer: Medicare Other | Admitting: Rehabilitative and Restorative Service Providers"

## 2016-10-30 DIAGNOSIS — R2681 Unsteadiness on feet: Secondary | ICD-10-CM

## 2016-10-30 DIAGNOSIS — R2689 Other abnormalities of gait and mobility: Secondary | ICD-10-CM | POA: Diagnosis not present

## 2016-10-30 DIAGNOSIS — M6281 Muscle weakness (generalized): Secondary | ICD-10-CM

## 2016-10-30 NOTE — Therapy (Signed)
[facility] [address] [place], [place], [address] Phone: [phone]   Fax:  [phone]  Physical Therapy Treatment  Patient Details  Name: Patricia [name] MRN: [id number] Date of Birth: 1929/12/09 Referring Provider: Sarina Ill, MD  Encounter Date: 10/30/2016      PT End of Session - 10/30/16 1427    Visit Number 7   Number of Visits 17   Date for PT Re-Evaluation 11/19/16   Authorization Type [employer] [place] on every 10th visit   PT Start Time 1030   PT Stop Time 1100   PT Time Calculation (min) 30 min   Equipment Utilized During Treatment Gait belt   Activity Tolerance Patient tolerated treatment well;No increased pain   Behavior During Therapy WFL for tasks assessed/performed      Past Medical History:  Diagnosis Date  . Hypothyroid   . Stroke [facility])     Past Surgical History:  Procedure Laterality Date  . CATARACT EXTRACTION Bilateral   . EYE SURGERY Left 02/15/15    Laser surgery     There were no vitals filed for this visit.      Subjective Assessment - 10/30/16 1029    Subjective The patient was late today due to traffic.  She is not fitting exercise in regularly still.    Patient Stated Goals "I want to work on my balance."    Currently in Pain? No/denies            [facility] PT Assessment - 10/30/16 1038      Berg Balance Test   Sit to Stand Able to stand without using hands and stabilize independently   Standing Unsupported Able to stand safely 2 minutes   Sitting with Back Unsupported but Feet Supported on Floor or Stool Able to sit safely and securely 2 minutes   Stand to Sit Sits safely with minimal use of hands   Transfers Able to transfer safely, minor use of hands   Standing Unsupported with Eyes Closed Able to stand 10 seconds safely   Standing Ubsupported with Feet Together Able to place feet together independently and stand for 1 minute with supervision   From Standing, Reach  Forward with Outstretched Arm Can reach confidently >25 cm (10")   From Standing Position, Pick up Object from Floor Able to pick up shoe safely and easily   From Standing Position, Turn to Look Behind Over each Shoulder Looks behind from both sides and weight shifts well   Turn 360 Degrees Able to turn 360 degrees safely in 4 seconds or less   Standing Unsupported, Alternately Place Feet on Step/Stool Able to stand independently and safely and complete 8 steps in 20 seconds   Standing Unsupported, One Foot in Front Able to take small step independently and hold 30 seconds   Standing on One Leg Able to lift leg independently and hold 5-10 seconds   Total Score 52   Berg comment: 52/56 improved from evaluation of 42/56.                     [facility] Adult PT Treatment/Exercise - 10/30/16 1038      Ambulation/Gait   Ambulation/Gait Yes   Ambulation/Gait Assistance 7: Independent   Ambulation Distance (Feet) 500 Feet   Assistive device None   Ambulation Surface Level     Therapeutic Activites    Therapeutic Activities Other Therapeutic Activities   Other Therapeutic Activities floor<>stand transfers     Neuro Re-ed  Neuro Re-ed Details  marching in place and marching anteriorly with CGA with verbal cues for increased "power" with gait.                    PT Short Term Goals - 10/16/16 1132      PT SHORT TERM GOAL #1   Title Pt will initiate HEP in order to indicate improved functional mobility.  (Target Date: 10/19/15)   Baseline Met, 10/16/16.   Time 4   Period Weeks   Status Achieved     PT SHORT TERM GOAL #2   Title Will assess BERG and improve score by 4 points in order to indicate decreased fall risk.     Baseline Scored 49/56; inital score on eval was 42/56; 10/16/16.   Time 4   Period Weeks   Status Achieved     PT SHORT TERM GOAL #3   Title Pt will perform 5TSS in <18 secs with single UE support only in order to indicate improved functional  strength.     Baseline 14.72 seconds with 1 UE support; 10/16/16.   Time 4   Period Weeks   Status Achieved     PT SHORT TERM GOAL #4   Title Pt will demonstrate compliance with using RW at all times to decrease fall risk and improve stability with decreased knee pain.     Baseline Pt has not had pain in knees since orthopedic shots and has not used a RW at home or in the community; 10/16/16.   Time 4   Period Weeks   Status Deferred     PT SHORT TERM GOAL #5   Title Pt will improve gait speed to 1.93 ft/sec w/ LRAD in order to indicate decreased fall risk.    Baseline 2.87 ft/sec, no AD; 10/16/16.   Time 4   Period Weeks   Status Achieved           PT Long Term Goals - 10/30/16 1051      PT LONG TERM GOAL #1   Title The patient will be indep with HEP for high level balance, LE strength and general mobility. (Target Date: 11/15/16)   Time 8   Period Weeks   Status On-going     PT LONG TERM GOAL #2   Title Pt will improve BERG balance score 8 points from baseline in order to indicate decreased fall risk.     Baseline Improved from 42/56 at eval on 09/20/17 up to 52/56 today.   Time 8   Period Weeks   Status Achieved     PT LONG TERM GOAL #3   Title Pt will improve gait speed to 2.53 ft/sec in order to indicate decreased fall risk and improved efficiency of gait.    Baseline Improved to 2.78 ft/sec per notes on 10/16/2016   Time 8   Period Weeks   Status Achieved     PT LONG TERM GOAL #4   Title Pt will ambulate 500' over unlevel paved surfaces w/ LRAD at mod I level in order to indicate safe community mobility.     Time 8   Period Weeks   Status On-going     PT LONG TERM GOAL #5   Title The patient will verbalize understanding of community exercises for post d/c activities.   Baseline Patient verbalizes understanding and has plan for community exercise, however does not plan to participate until after her daughter returns to work (after a recent injury).  Time 8    Period Weeks   Status Achieved               Plan - 10/30/16 1431    Clinical Impression Statement The patient has met 3/5 LTGs.  PT to continue working towards unmet LTGs.  Anticipate d/c soon as patient is not able to carryover program to home or community due to obligations she has in caring for her daughter after a recent injury (notes her daughter is independent, however she stays with her each day at this time).  PT encouraged patient to perform HEP at her daughters as able.     PT Treatment/Interventions ADLs/Self Care Home Management;DME Instruction;Gait training;Stair training;Functional mobility training;Therapeutic activities;Therapeutic exercise;Balance training;Neuromuscular re-education;Patient/family education;Vestibular   PT Next Visit Plan f/u in one week to determine if carryover to home occurring.  May d/c early?     Consulted and Agree with Plan of Care Patient      Patient will benefit from skilled therapeutic intervention in order to improve the following deficits and impairments:  Decreased balance, Decreased endurance, Decreased knowledge of use of DME, Decreased mobility, Decreased range of motion, Decreased safety awareness, Decreased strength, Difficulty walking, Impaired perceived functional ability, Impaired flexibility, Impaired sensation, Improper body mechanics, Pain  Visit Diagnosis: Other abnormalities of gait and mobility  Muscle weakness (generalized)  Unsteadiness on feet     Problem List Patient Active Problem List   Diagnosis Date Noted  . Small fiber neuropathy ([place]) 09/03/2016  . Compression fracture of thoracic vertebra (HCC) 06/21/2016  . MGUS (monoclonal gammopathy of unknown significance) 02/16/2016  . Quality of life palliative care encounter 02/16/2016  . Elevated blood pressure 02/16/2016  . Polyneuropathy ([place]) 01/15/2016  . Neuropathy ([place]) 01/10/2016  . Occipital stroke ([place]) 02/21/2015    Ibn Stief,  PT  10/30/2016, 2:33 PM  [facility] [address] [place], [place], [address] Phone: [phone]   Fax:  [phone]  Name: Lavaya Defreitas MRN: [id number] Date of Birth: 06/29/1930

## 2016-11-04 ENCOUNTER — Encounter: Payer: Medicare Other | Admitting: Physical Therapy

## 2016-11-06 ENCOUNTER — Ambulatory Visit: Payer: Medicare Other | Admitting: Rehabilitative and Restorative Service Providers"

## 2016-11-06 DIAGNOSIS — M6281 Muscle weakness (generalized): Secondary | ICD-10-CM

## 2016-11-06 DIAGNOSIS — R2689 Other abnormalities of gait and mobility: Secondary | ICD-10-CM | POA: Diagnosis not present

## 2016-11-06 DIAGNOSIS — R2681 Unsteadiness on feet: Secondary | ICD-10-CM

## 2016-11-06 NOTE — Therapy (Signed)
[facility] [address] [place], [place], [address] Phone: [phone]   Fax:  [phone]  Physical Therapy Treatment and Discharge Summary  Patient Details  Name: Patricia [name] MRN: [id number] Date of Birth: 27-May-1930 Referring Provider: Sarina Ill, MD  Encounter Date: 11/06/2016      PT End of Session - 11/06/16 1024    Visit Number 8   Number of Visits 17   Date for PT Re-Evaluation 11/19/16   Authorization Type [employer] [place] on every 10th visit   PT Start Time 1018   PT Stop Time 1048   PT Time Calculation (min) 30 min   Equipment Utilized During Treatment Gait belt   Activity Tolerance Patient tolerated treatment well;No increased pain   Behavior During Therapy WFL for tasks assessed/performed      Past Medical History:  Diagnosis Date  . Hypothyroid   . Stroke [facility])     Past Surgical History:  Procedure Laterality Date  . CATARACT EXTRACTION Bilateral   . EYE SURGERY Left 02/15/15    Laser surgery     There were no vitals filed for this visit.      Subjective Assessment - 11/06/16 1016    Subjective The patient feels like her balance is minimally better, but she has not been able to exercise.  The patient notes that she is currently not able to participate in home exercises or community programs because of helping her daughter and grandson (daughter out of work due to injury and grandson struggling with addiction issues).     Patient Stated Goals "I want to work on my balance."    Currently in Pain? No/denies            [facility] PT Assessment - 11/06/16 1040      Ambulation/Gait   Ambulation/Gait Assistance 7: Independent;6: Modified independent (Device/Increase time)   Assistive device Straight cane;None   Ambulation Surface Level   Gait Comments Educated patient on use of SPC due to her inquiry.  She does not score in a high fall risk range at this time per Merrilee Jansky, however at evaluation  did score in high fall risk range.  She has demonstrated progress with therapy, however is limited in her ability to carryover activities to the community and therefore may not maintain current gains unless she chooses to participate in HEP and/or community wellness.   Patient notes her daughter feels that she has instability outdoors at times.  PT provided education with SPC in R hand and sequencing for safety.  Also performed curb, ramp and stair negotiation.  Patient has [place] that her husband used.  She was instructed on proper height for setup.       High Level Balance   High Level Balance Comments Reviewed all HEP for balance and LE strengthening.  See prior handouts.                      [facility] Adult PT Treatment/Exercise - 11/06/16 1040      Ambulation/Gait   Ambulation/Gait Yes   Ambulation/Gait Assistance Details patient inquired about cane due to her daugher requesting she use a cane;      Self-Care   Self-Care Other Self-Care Comments   Other Self-Care Comments  Patient and PT discussed indications for cane use.  Recommended participation in community program with friends at community center.                 PT Education -  11/06/16 1059    Education provided Yes   Education Details discussed when use of single point cane would be indicated (only on unlevel surfaces).    Person(s) Educated Patient   Methods Explanation;Handout   Comprehension Verbalized understanding;Returned demonstration          PT Short Term Goals - 10/16/16 1132      PT SHORT TERM GOAL #1   Title Pt will initiate HEP in order to indicate improved functional mobility.  (Target Date: 10/19/15)   Baseline Met, 10/16/16.   Time 4   Period Weeks   Status Achieved     PT SHORT TERM GOAL #2   Title Will assess BERG and improve score by 4 points in order to indicate decreased fall risk.     Baseline Scored 49/56; inital score on eval was 42/56; 10/16/16.   Time 4   Period Weeks    Status Achieved     PT SHORT TERM GOAL #3   Title Pt will perform 5TSS in <18 secs with single UE support only in order to indicate improved functional strength.     Baseline 14.72 seconds with 1 UE support; 10/16/16.   Time 4   Period Weeks   Status Achieved     PT SHORT TERM GOAL #4   Title Pt will demonstrate compliance with using RW at all times to decrease fall risk and improve stability with decreased knee pain.     Baseline Pt has not had pain in knees since orthopedic shots and has not used a RW at home or in the community; 10/16/16.   Time 4   Period Weeks   Status Deferred     PT SHORT TERM GOAL #5   Title Pt will improve gait speed to 1.93 ft/sec w/ LRAD in order to indicate decreased fall risk.    Baseline 2.87 ft/sec, no AD; 10/16/16.   Time 4   Period Weeks   Status Achieved           PT Long Term Goals - 11/06/16 1942      PT LONG TERM GOAL #1   Title The patient will be indep with HEP for high level balance, LE strength and general mobility. (Target Date: 11/15/16)   Baseline Patient has HEP, however is not performing due to current demands (family situations, time).   Time 8   Period Weeks   Status Partially Met     PT LONG TERM GOAL #2   Title Pt will improve BERG balance score 8 points from baseline in order to indicate decreased fall risk.     Baseline Improved from 42/56 at eval on 09/20/17 up to 52/56 today.   Time 8   Period Weeks   Status Achieved     PT LONG TERM GOAL #3   Title Pt will improve gait speed to 2.53 ft/sec in order to indicate decreased fall risk and improved efficiency of gait.    Baseline Improved to 2.78 ft/sec per notes on 10/16/2016   Time 8   Period Weeks   Status Achieved     PT LONG TERM GOAL #4   Title Pt will ambulate 500' over unlevel paved surfaces w/ LRAD at mod I level in order to indicate safe community mobility.     Baseline Patient performed indoors due to low temperatures demonstrating curb and ramp negotiation  without a device modified indep.    Time 8   Period Weeks   Status Achieved  PT LONG TERM GOAL #5   Title The patient will verbalize understanding of community exercises for post d/c activities.   Baseline Patient verbalizes understanding and has plan for community exercise, however does not plan to participate until after her daughter returns to work (after a recent injury).   Time 8   Period Weeks   Status Achieved               Plan - 2016-12-06 1954    Clinical Impression Statement The patient met all STGs and 4/5 LTGs.  She has HEP, however is not carrying over activities to home citing social stresses as limiting factor.  Patient also has community information for exercise, as well as silver sneakers.  PT recommended patient continue home program and seek community wellness as her social situation allows.    PT Treatment/Interventions ADLs/Self Care Home Management;DME Instruction;Gait training;Stair training;Functional mobility training;Therapeutic activities;Therapeutic exercise;Balance training;Neuromuscular re-education;Patient/family education;Vestibular   PT Next Visit Plan Discharge today   Consulted and Agree with Plan of Care Patient      Patient will benefit from skilled therapeutic intervention in order to improve the following deficits and impairments:  Decreased balance, Decreased endurance, Decreased knowledge of use of DME, Decreased mobility, Decreased range of motion, Decreased safety awareness, Decreased strength, Difficulty walking, Impaired perceived functional ability, Impaired flexibility, Impaired sensation, Improper body mechanics, Pain  Visit Diagnosis: Other abnormalities of gait and mobility  Muscle weakness (generalized)  Unsteadiness on feet       G-Codes - 12/06/16 1945    Functional Assessment Tool Used Berg=52/56, gait speed=2.78 ft/sec, sit to stand = 14.72 seconds.    Functional Limitation Mobility: Walking and moving around    Mobility: Walking and Moving Around Goal Status [phone]) At least 1 percent but less than 20 percent impaired, limited or restricted   Mobility: Walking and Moving Around Discharge Status [phone]) At least 1 percent but less than 20 percent impaired, limited or restricted     PHYSICAL THERAPY DISCHARGE SUMMARY  Visits from Start of Care: 8   Current functional level related to goals / functional outcomes: See above for goal status.   Remaining deficits: H/o chronic knee pain- currently not hindering mobility after recent injections High level balance deficits  LE weakness- provided HEP   Education / Equipment: HEP, fall prevention, use of assistive device and community wellness programs.   Plan: Patient agrees to discharge.  Patient goals were met. Patient is being discharged due to meeting the stated rehab goals.  ?????        Thank you for the referral of this patient. Rudell Cobb, MPT   [place], PT 2016/12/06, 7:55 PM  [facility] [address] [place], [place], [address] Phone: [phone]   Fax:  [phone]  Name: Conchita Truxillo MRN: [id number] Date of Birth: [birth year]

## 2016-11-06 NOTE — Patient Instructions (Addendum)
Set up for cane height:   Cane should line up with your wrist when your arm is hanging by your side.     ONLY RECOMMEND CANE USE FOR WHEN YOU ARE ON UNLEVEL SURFACES.   Cane Use Canes are used to help with walking. Using a cane makes you more stable, reduces pain, and eases strain on certain muscle groups. It is important to use a cane that fits properly. A cane fits properly if the top of the cane comes to your wrist joint when you are standing upright with your arm relaxed at your side. How to use your cane Hold your cane in the hand opposite the injured or weaker side. Move the cane with the weaker foot at all times.  USE IN YOUR RIGHT HAND> Walking  Put as much weight on the cane as necessary to make walking comfortable, stable, and smooth.  Stand tall with good posture and look ahead, not down at your feet. Going Up Steps  Step first with your stronger foot.  Move the cane and the weaker foot up the step at the same time. Going Down Steps  Step first with the cane and your weaker foot.  Always use the railing with your free hand. Contact a health care provider if:  You still feel unsteady on your feet.  You develop new pain, for example in your back, shoulder, wrist, or hip.  You develop any numbness or tingling. Get help right away if: You fall. This information is not intended to replace advice given to you by your health care provider. Make sure you discuss any questions you have with your health care provider. Document Released: 09/23/2005 Document Revised: 03/06/2016 Document Reviewed: 05/27/2013 Elsevier Interactive Patient Education  2017 [employer].

## 2016-11-11 ENCOUNTER — Encounter: Payer: Medicare Other | Admitting: Rehabilitative and Restorative Service Providers"

## 2016-11-15 ENCOUNTER — Encounter: Payer: Medicare Other | Admitting: Rehabilitative and Restorative Service Providers"

## 2016-11-18 ENCOUNTER — Encounter: Payer: Medicare Other | Admitting: Rehabilitative and Restorative Service Providers"

## 2016-11-21 ENCOUNTER — Encounter: Payer: Medicare Other | Admitting: Rehabilitative and Restorative Service Providers"

## 2016-12-19 ENCOUNTER — Ambulatory Visit: Payer: Medicare Other | Admitting: Rehabilitative and Restorative Service Providers"

## 2017-03-10 ENCOUNTER — Ambulatory Visit: Payer: Medicare Other | Admitting: Neurology

## 2017-03-31 IMAGING — CR DG ABDOMEN 1V
1 series · 1 of 1 positions shown · non-contrast
Comparison: 07/11/2015.

CLINICAL DATA: 85-year-old female Sitz marker exam. Subsequent
encounter.

EXAM:
ABDOMEN - 1 VIEW

[view not recorded]
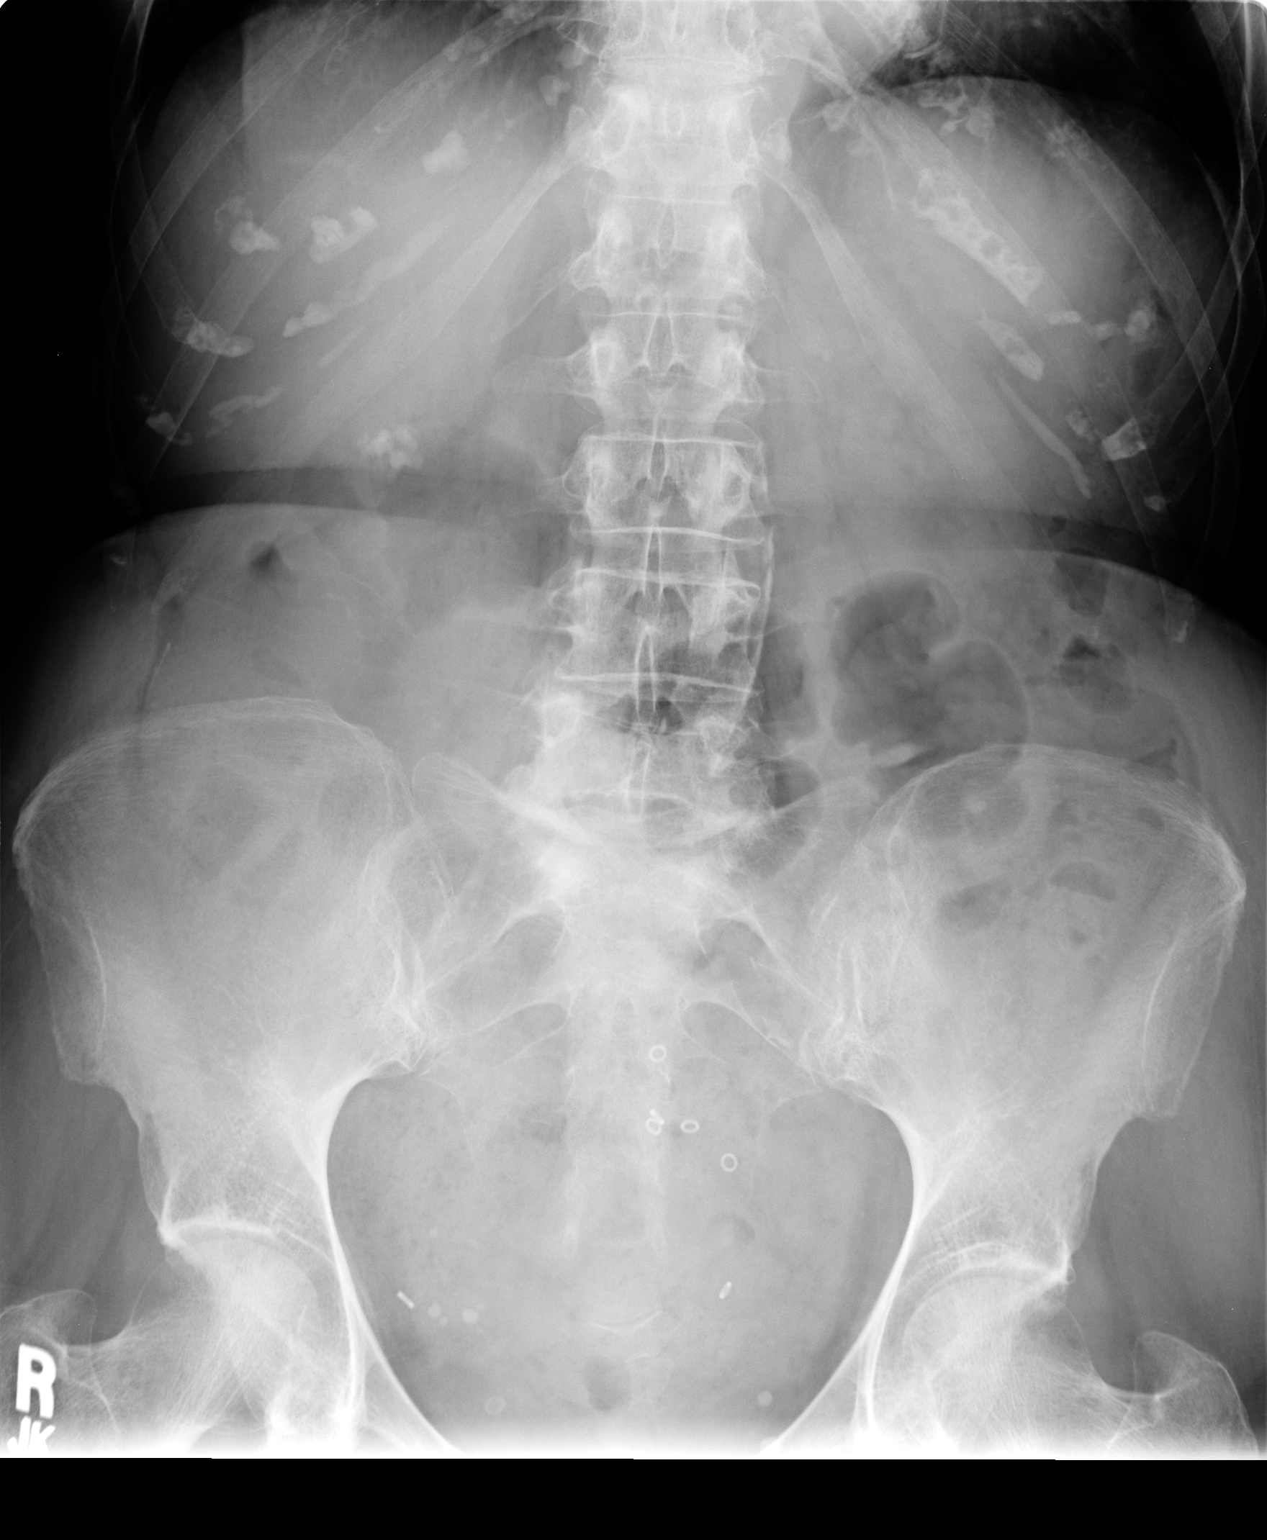

[1 of 1 positions shown; findings below may reference images not displayed]

FINDINGS: Seven residual Sitz markers, all project in the pelvis. Previously
approximately twice is many markers were present. Superimposed
relatively large volume of stool also projecting in the pelvis. Non
obstructed bowel gas pattern. Negative visualized lung bases.
Cholelithiasis. Calcified aortic atherosclerosis. No acute osseous
abnormality identified.
IMPRESSION: 1. Seven residual Sitz markers project in the pelvis along with
retained stool in the colon.
2. Cholelithiasis.
3. Calcified aortic atherosclerosis.

## 2017-04-18 ENCOUNTER — Other Ambulatory Visit: Payer: Medicare Other

## 2017-04-21 ENCOUNTER — Other Ambulatory Visit (HOSPITAL_BASED_OUTPATIENT_CLINIC_OR_DEPARTMENT_OTHER): Payer: Medicare Other

## 2017-04-21 DIAGNOSIS — D472 Monoclonal gammopathy: Secondary | ICD-10-CM | POA: Diagnosis not present

## 2017-04-21 LAB — COMPREHENSIVE METABOLIC PANEL
ALT: 17 U/L (ref 0–55)
AST: 20 U/L (ref 5–34)
Albumin: 3.8 g/dL (ref 3.5–5.0)
Alkaline Phosphatase: 52 U/L (ref 40–150)
Anion Gap: 10 mEq/L (ref 3–11)
BUN: 20.6 mg/dL (ref 7.0–26.0)
CO2: 25 mEq/L (ref 22–29)
Calcium: 9.5 mg/dL (ref 8.4–10.4)
Chloride: 106 mEq/L (ref 98–109)
Creatinine: 0.9 mg/dL (ref 0.6–1.1)
EGFR: 56 mL/min/{1.73_m2} — ABNORMAL LOW (ref >90)
Glucose: 103 mg/dl (ref 70–140)
Potassium: 4.3 mEq/L (ref 3.5–5.1)
Sodium: 141 mEq/L (ref 136–145)
Total Bilirubin: 0.45 mg/dL (ref 0.20–1.20)
Total Protein: 7.5 g/dL (ref 6.4–8.3)

## 2017-04-21 LAB — CBC WITH DIFFERENTIAL/PLATELET
BASO%: 1.1 % (ref 0.0–2.0)
Basophils Absolute: 0.1 10*3/uL (ref 0.0–0.1)
EOS%: 2.8 % (ref 0.0–7.0)
Eosinophils Absolute: 0.2 10*3/uL (ref 0.0–0.5)
HCT: 41.2 % (ref 34.8–46.6)
HGB: 13 g/dL (ref 11.6–15.9)
LYMPH%: 22.6 % (ref 14.0–49.7)
MCH: 30.6 pg (ref 25.1–34.0)
MCHC: 31.6 g/dL (ref 31.5–36.0)
MCV: 96.9 fL (ref 79.5–101.0)
MONO#: 0.7 10*3/uL (ref 0.1–0.9)
MONO%: 8.6 % (ref 0.0–14.0)
NEUT#: 4.9 10*3/uL (ref 1.5–6.5)
NEUT%: 64.9 % (ref 38.4–76.8)
Platelets: 262 10*3/uL (ref 145–400)
RBC: 4.25 10*6/uL (ref 3.70–5.45)
RDW: 13.5 % (ref 11.2–14.5)
WBC: 7.6 10*3/uL (ref 3.9–10.3)
lymph#: 1.7 10*3/uL (ref 0.9–3.3)

## 2017-04-23 ENCOUNTER — Ambulatory Visit (INDEPENDENT_AMBULATORY_CARE_PROVIDER_SITE_OTHER): Payer: Medicare Other | Admitting: Neurology

## 2017-04-23 ENCOUNTER — Encounter: Payer: Self-pay | Admitting: Neurology

## 2017-04-23 VITALS — BP 136/74 | Ht 66.0 in | Wt 173.0 lb

## 2017-04-23 DIAGNOSIS — G609 Hereditary and idiopathic neuropathy, unspecified: Secondary | ICD-10-CM | POA: Diagnosis not present

## 2017-04-23 DIAGNOSIS — R2689 Other abnormalities of gait and mobility: Secondary | ICD-10-CM | POA: Diagnosis not present

## 2017-04-23 LAB — KAPPA/LAMBDA LIGHT CHAINS
Ig Kappa Free Light Chain: 32.9 mg/L — ABNORMAL HIGH (ref 3.3–19.4)
Ig Lambda Free Light Chain: 16.3 mg/L (ref 5.7–26.3)
Kappa/Lambda FluidC Ratio: 2.02 — ABNORMAL HIGH (ref 0.26–1.65)

## 2017-04-23 NOTE — Progress Notes (Signed)
[facility]    Provider:  Dr Jaynee Eagles Referring Provider: Deland Pretty, MD Primary Care Physician:  Deland Pretty, MD   CC: stroke and small-fiber neuropathy, falls and imbalance  Interval history 04/23/2017: No falls, everything is going well at home. Her feet tingle every now and then no pain. Her husband died. Her son is helping her with finances. They are selling property in [place]. They have 6 rentals there and she is getting her finances in order, her son [name] is her POA. Her husband died 10-20-15 and this has been a transition. Her balance has improved. No new symptoms.   Interval history 09/02/2016: She is going to have surgery on her right wrist for CTS. She has severe knee pain and is getting injections in her knees. Difficulty with stairs and standing because th eknees are hurting so much. She feels weakness in the legs. She has had 2 falls, she was at her grandson's employment and she sat down on stool and fell off, the second time it was due to balance she became off balance and fell over. She has an L1 fractrure now. She is on Fosamax and vitamin D3 for osteopenia. She continues to have tingling in the feet which is not painful. She loves Dr. Caralyn Guile. He is great. She is taking asoirin every day and not missing it.   Interval history 01/10/2016: Patient is a lovely 83 year old female who I have seen in the past for an ischemic occipital stroke. She is here for a new problem. The left top of her foot was swollen. A skin biopsy at her pediatrician's diagnosed with what sounds like small fiber neuropathy. She has some cramping of the hands. Her podiatrist diagnosed her with neuropathy. She has numbness and tingling in the feet. She also has balance problems. She has some cramping in the feet. Both of her feet with electric shocks in the toes. Been going on for about a year. Balance is worsening.No falls. Her husband recently passed away. Feels like she is walking on  pads. Had a lengthy discussion idiopathic small fiber neuropathy, diabetes which is the most common diagnosis that there are a variety of other conditions that can cause this. Often it is idiopathic. Discussed the pathophysiology and the reason for impaired balance.  HPI: Patricia [name] is a 83 y.o. female here as a follow up. PMHx hypothyroidism, mild tremor. She is doing well. No new events. No other problems. Her LDL was recently taken and is 57. The end of words are sometimes hard to read. The end of the letters are blurred. Her vision has improved.   MRA: IMPRESSION: Abnormal MRA brain showing severe atheromatous changes in distal left posterior cerebral artery and moderate atheromatous narrowing of distal right posterior cerebral artery. Persistent fetal pattern of origin of right posterior cerebral and hypoplastic terminal right vertebral and A1 segment of right anterior cerebral arteries are benign birth variants  Interval update 02/20/2015: Patient returns today after findings on MRI of the brain indicate an subacute ischemic event in the occipital area. Explained to patient that this is likely the reason for her symptoms that she was seen in the office last month. The area affected by stroke can also affect vision, and patient still reports blurry vision and difficulty reading. She was accompanied by daughter today. Reviewed all images with this lovely patient and her very nice daughter, showed patient to stroke, discussed the differential and different causes for this. Patient's MRI also showed nonspecific white matter changes  likely small vessel chronic ischemic changes. Discussed with daughter and patient that we should start Lipitor, she should continue taking an aspirin for stroke prevention. Also feel as though a complete stroke workup is indicated including imaging of the vessels in the head, echocardiogram with bubble study.  MRi of the brain: IMPRESSION: This is an abnormal MRI of the  brain with and without contrast showed the following: 1. Small focus in the left occipital lobe likely representing a small sub-chronic stroke.  2. Age-appropriate minimal atrophy and mild small vessel ischemic change. Recently reviewed images and agree with this report. Patient also brings in labs which show unremarkable CBC, unremarkable CMP, LDL mildly elevated at 129 goal is less than 70, TSH within normal limits.  Initial visit 01/26/2015:She was at home on March 25th, had a headache on the left frontal area, was tring to change the channel, she couldn't get it to change. She felt confused, doesn't have an app to the change the channel. The controls were right in front of her. She lost consciousness from then until midnight. She regained consciousness in the same place, shocked it was midnight. Tried to use cell phone again, still confused at that time. Didn't know how to get to her bedroom, couldn't get the TV off. She turned the power button off and went to bed. Vision is blurry since the episode, right prisms, muted colors for several days afterwards. No fhx seizure, or migraines. No recents infections but did hit her ankle on March 4th a hitch which was swollen. There was swelling and bruising but no cellulitis. Here with daughter who has never noticed anything unusual. No urinary symptoms. No other episodes of staring, strange behavior, altered awareness, loss of urine, biting of tongue, finding herself on floor or in strange places, getting lost in the car. Doesn't believe she fell asleep. She has a lot of stress, her husband had a stroke. She is main caretaker.    Review of Systems: Patient complains of symptoms per HPI as well as the following symptoms: Denies Chest pain and denies shortness of breath denies fever and systemic symptoms. Pertinent negatives per HPI. All others negative.  Review of Systems: Patient complains of symptoms per HPI as well as the following symptoms: no  CP, no SOB. Pertinent negatives per HPI. All others negative   Social History   Social History  . Marital status: Married    Spouse name: Joe   . Number of children: 4  . Years of education: 16+   Occupational History  . Not on file.   Social History Main Topics  . Smoking status: Never Smoker  . Smokeless tobacco: Never Used  . Alcohol use 0.0 oz/week     Comment: Very rarley   . Drug use: No  . Sexual activity: Not on file     Comment: widowed, 2 daughters and 1 son. Retired Education officer, museum   Other Topics Concern  . 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 Heart disease Mother   . Throat cancer Maternal Aunt   . Cancer Maternal Aunt        breast ca  . Lupus Daughter   . Anuerysm Daughter        Brain    Past Medical History:  Diagnosis Date  .  Hypothyroid   . Stroke [facility])     Past Surgical History:  Procedure Laterality Date  . CATARACT EXTRACTION Bilateral   . EYE SURGERY Left 02/15/15    Laser surgery     Current Outpatient Prescriptions  Medication Sig Dispense Refill  . Alendronate Sodium (FOSAMAX PO) Take 1 tablet by mouth once a week.    Marland Kitchen aspirin 81 MG chewable tablet Chew 81 mg by mouth daily.    Marland Kitchen atorvastatin (LIPITOR) 10 MG tablet Take 1 tablet (10 mg total) by mouth daily. 90 tablet 11  . Biotin 5000 MCG TABS Take 1 tablet by mouth daily.    Marland Kitchen CALCIUM PO Take 1,000 mg by mouth daily.    . Cholecalciferol (VITAMIN D-3 PO) Take 2,000 Units by mouth 2 (two) times daily.     . cyclobenzaprine (FLEXERIL) 10 MG tablet Take 10 mg by mouth at bedtime.    . diclofenac sodium (VOLTAREN) 1 % GEL Apply topically 4 (four) times daily.    . Flaxseed, Linseed, (FLAX SEED OIL PO) Take 1,000 mg by mouth daily.    Marland Kitchen GLUCOSAMINE-CHONDROITIN PO Take 1,500 mg by mouth daily.    Marland Kitchen levothyroxine (SYNTHROID,  LEVOTHROID) 88 MCG tablet Take 88 mcg by mouth daily before breakfast.    . MELATONIN PO Take 5 mg by mouth at bedtime as needed.     . mirabegron ER (MYRBETRIQ) 50 MG TB24 tablet Take 50 mg by mouth daily.    . Multiple Vitamin (MULTIVITAMIN) tablet Take 1 tablet by mouth daily.    . Omega-3 Fatty Acids (FISH OIL PO) Take 1,200 mg by mouth daily.    . polyethylene glycol (MIRALAX / GLYCOLAX) packet Take 17 g by mouth daily.    Marland Kitchen POTASSIUM PO Take 550 mg by mouth daily.    . Probiotic Product (PROBIOTIC DAILY PO) Take 1 tablet by mouth daily.    . sertraline (ZOLOFT) 50 MG tablet Take 25 mg by mouth daily.    . TURMERIC PO Take 750 mg by mouth daily.    . vitamin B-12 (CYANOCOBALAMIN) 1000 MCG tablet Take 1,000 mcg by mouth daily.    . vitamin C (ASCORBIC ACID) 500 MG tablet Take 500 mg by mouth daily.     No current facility-administered medications for this visit.     Allergies as of 04/23/2017  . (No Known Allergies)    Vitals: BP 136/74   Ht 5\' 6"  (1.676 m)   Wt 173 lb (78.5 kg)   BMI 27.92 kg/m  Last Weight:  Wt Readings from Last 1 Encounters:  04/23/17 173 lb (78.5 kg)   Last Height:   Ht Readings from Last 1 Encounters:  04/23/17 5\' 6"  (1.676 m)       Speech:  Speech is normal; fluent and spontaneous with normal comprehension.  Cognition:  The patient is oriented to person, place, and time;   recent and remote memory intact;   language fluent;   normal attention, concentration,   fund of knowledge Cranial Nerves:  The pupils are equal, round, and reactive to light. Visual fields are full to finger confrontation. Extraocular movements are intact. Trigeminal sensation is intact and the muscles of mastication are normal. The face is symmetric. The palate elevates in the midline. Hearing intact. Voice is normal. Shoulder shrug is normal. The tongue has normal motion without fasciculations.   Sensation: Intact pin prick LE, decreased temperature  glove and stocking. Absent vibration at the great toes, few seconds at medial malleoli.  Impaired proprioception distally. Sway on Romberg.  Motor observation: Some loss of muscle, atrophy in the intrinsic hand muscles most pronounced in the FDI and hyperthenar areas.   Assessment/Plan: This is a lovely 83 year old female who is here with her daughter for follow-up. She presented in April after an episode of altered awareness and possible loss of consciousness with vision changes. MRI of the brain showed a subacute left occipital ischemic infarct. MRA showed severe atheromatous changes in distal left posterior cerebral artery and moderate atheromatous narrowing of distal right posterior cerebral artery. She has been recently diagnosed with small-fiber neuropathy in the feet. Exam does show decreased sensation in a glove and stocking distribution in both small and large fibers.  Neuropathy: serum panel was negative except for MGUS and she follows with hematology. Stable.  CTS: She is seeing Dr Apolonio Schneiders, Stable.  Falls: Stable  Stroke: Stable. Follow very closely with primary care for management of vascular risk factors Patient on aspirin, she should continue to take this for stroke prevention. Started patient on Lipitor for LDL goal less than 70. Carotid Dopplers were already completed and unremarkable. echocardiogram with bubble study unremarkable.   Sarina Ill, MD  [facility] [address] [place], [place] [address]  Phone [phone] Fax [phone]  A total of 15 minutes was spent face-to-face with this patient. Over half this time was spent on counseling patient on the imbalance, neuropathy diagnosis and different diagnostic and therapeutic options available.

## 2017-04-23 NOTE — Patient Instructions (Signed)
Remember to drink plenty of fluid, eat healthy meals and do not skip any meals. Try to eat protein with a every meal and eat a healthy snack such as fruit or nuts in between meals. Try to keep a regular sleep-wake schedule and try to exercise daily, particularly in the form of walking, 20-30 minutes a day, if you can.   As far as your medications are concerned, I would like to suggest: Continue Current medications  I would like to see you back in 1 year, sooner if we need to. Please call us with any interim questions, concerns, problems, updates or refill requests.   Our phone number is [phone]. We also have an after hours call service for urgent matters and there is a physician on-call for urgent questions. For any emergencies you know to call 911 or go to the nearest emergency room

## 2017-04-25 ENCOUNTER — Telehealth: Payer: Self-pay | Admitting: Hematology and Oncology

## 2017-04-25 ENCOUNTER — Ambulatory Visit (HOSPITAL_BASED_OUTPATIENT_CLINIC_OR_DEPARTMENT_OTHER): Payer: Medicare Other | Admitting: Hematology and Oncology

## 2017-04-25 VITALS — BP 155/78 | HR 88 | Temp 98.8°F | Resp 18 | Ht 66.0 in | Wt 173.3 lb

## 2017-04-25 DIAGNOSIS — D472 Monoclonal gammopathy: Secondary | ICD-10-CM

## 2017-04-25 NOTE — Telephone Encounter (Signed)
Scheduled appt per 7/20 los - Gave patient AVS and calender per los. Lab and f/u in one year.

## 2017-04-26 LAB — MULTIPLE MYELOMA PANEL, SERUM
Albumin SerPl Elph-Mcnc: 3.8 g/dL (ref 2.9–4.4)
Albumin/Glob SerPl: 1.3 (ref 0.7–1.7)
Alpha 1: 0.2 g/dL (ref 0.0–0.4)
Alpha2 Glob SerPl Elph-Mcnc: 0.5 g/dL (ref 0.4–1.0)
B-Globulin SerPl Elph-Mcnc: 1.7 g/dL — ABNORMAL HIGH (ref 0.7–1.3)
Gamma Glob SerPl Elph-Mcnc: 0.7 g/dL (ref 0.4–1.8)
Globulin, Total: 3.1 g/dL (ref 2.2–3.9)
IgA, Qn, Serum: 1236 mg/dL — ABNORMAL HIGH (ref 64–422)
IgG, Qn, Serum: 757 mg/dL (ref 700–1600)
IgM, Qn, Serum: 105 mg/dL (ref 26–217)
M Protein SerPl Elph-Mcnc: 0.6 g/dL — ABNORMAL HIGH
Total Protein: 6.9 g/dL (ref 6.0–8.5)

## 2017-04-27 ENCOUNTER — Encounter: Payer: Self-pay | Admitting: Hematology and Oncology

## 2017-04-27 NOTE — Assessment & Plan Note (Signed)
I discussed with the patient the natural history of MGUS. Although her workup for myeloma is incomplete, she has no evidence of organ damage. I recommend seeing her back in 12 months for repeat blood work. She agreed with the plan of care

## 2017-04-27 NOTE — Progress Notes (Signed)
[facility] OFFICE PROGRESS NOTE  Patient Care Team: Deland Pretty, MD as PCP - General (Internal Medicine) Heath Lark, MD as Consulting Physician (Hematology and Oncology) Melvenia Beam, MD as Consulting Physician (Neurology) Lahoma Rocker, MD as Consulting Physician (Rheumatology)  SUMMARY OF ONCOLOGIC HISTORY:  Patricia [name] is here because of recent discovery of IgA MGUS It was discovered during workup for peripheral neuropathy by her neurologist She denies history of abnormal bone pain or bone fracture. Patient denies history of recurrent infection or atypical infections such as shingles of meningitis. Denies chills, night sweats, anorexia or abnormal weight loss. The patient has remote history of occipital stroke and chronic neuropathy. She denies persistent neurological deficit. Her main complain some mild neuropathy and gait disturbances.Marland Kitchen Her workup is unremarkable for organ damage.  She is placed on observation.  INTERVAL HISTORY: Please see below for problem oriented charting. She returns today for further follow-up No new bone pain. She continues to have peripheral neuropathy. Denies recent infection. No changes in appetite or weight Otherwise, she feels well.  She has gained some weight since the last time I saw her. She continues to be active in all activities of daily living.  REVIEW OF SYSTEMS:   Constitutional: Denies fevers, chills or abnormal weight loss Eyes: Denies blurriness of vision Ears, nose, mouth, throat, and face: Denies mucositis or sore throat Respiratory: Denies cough, dyspnea or wheezes Cardiovascular: Denies palpitation, chest discomfort or lower extremity swelling Gastrointestinal:  Denies nausea, heartburn or change in bowel habits Skin: Denies abnormal skin rashes Lymphatics: Denies new lymphadenopathy or easy bruising Neurological:Denies numbness, tingling or new weaknesses Behavioral/Psych: Mood is stable, no new changes   All other systems were reviewed with the patient and are negative.  I have reviewed the past medical history, past surgical history, social history and family history with the patient and they are unchanged from previous note.  ALLERGIES:  has No Known Allergies.  MEDICATIONS:  Current Outpatient Prescriptions  Medication Sig Dispense Refill  . cholecalciferol (VITAMIN D) 1000 units tablet Take 2,000 Units by mouth daily.    . Alendronate Sodium (FOSAMAX PO) Take 1 tablet by mouth once a week.    Marland Kitchen aspirin 81 MG chewable tablet Chew 81 mg by mouth daily.    Marland Kitchen atorvastatin (LIPITOR) 10 MG tablet Take 1 tablet (10 mg total) by mouth daily. 90 tablet 11  . Biotin 5000 MCG TABS Take 1 tablet by mouth daily.    Marland Kitchen CALCIUM PO Take 1,000 mg by mouth daily.    . Cholecalciferol (VITAMIN D-3 PO) Take 2,000 Units by mouth 2 (two) times daily.     . cyclobenzaprine (FLEXERIL) 10 MG tablet Take 10 mg by mouth at bedtime.    . Flaxseed, Linseed, (FLAX SEED OIL PO) Take 1,000 mg by mouth daily.    Marland Kitchen GLUCOSAMINE-CHONDROITIN PO Take 1,500 mg by mouth daily.    Marland Kitchen levothyroxine (SYNTHROID, LEVOTHROID) 88 MCG tablet Take 88 mcg by mouth daily before breakfast.    . MELATONIN PO Take 5 mg by mouth at bedtime as needed.     . mirabegron ER (MYRBETRIQ) 50 MG TB24 tablet Take 50 mg by mouth daily.    . Multiple Vitamin (MULTIVITAMIN) tablet Take 1 tablet by mouth daily.    . Omega-3 Fatty Acids (FISH OIL PO) Take 1,200 mg by mouth daily.    . polyethylene glycol (MIRALAX / GLYCOLAX) packet Take 17 g by mouth daily.    Marland Kitchen POTASSIUM PO Take 550  mg by mouth daily.    . Probiotic Product (PROBIOTIC DAILY PO) Take 1 tablet by mouth daily.    . sertraline (ZOLOFT) 50 MG tablet Take 25 mg by mouth daily.    . TURMERIC PO Take 750 mg by mouth daily.    . vitamin B-12 (CYANOCOBALAMIN) 1000 MCG tablet Take 1,000 mcg by mouth daily.    . vitamin C (ASCORBIC ACID) 500 MG tablet Take 500 mg by mouth daily.     No  current facility-administered medications for this visit.     PHYSICAL EXAMINATION: ECOG PERFORMANCE STATUS: 1 - Symptomatic but completely ambulatory  Vitals:   04/25/17 1122  BP: (!) 155/78  Pulse: 88  Resp: 18  Temp: 98.8 F (37.1 C)   Filed Weights   04/25/17 1122  Weight: 173 lb 4.8 oz (78.6 kg)    GENERAL:alert, no distress and comfortable SKIN: skin color, texture, turgor are normal, no rashes or significant lesions EYES: normal, Conjunctiva are pink and non-injected, sclera clear OROPHARYNX:no exudate, no erythema and lips, buccal mucosa, and tongue normal  NECK: supple, thyroid normal size, non-tender, without nodularity LYMPH:  no palpable lymphadenopathy in the cervical, axillary or inguinal LUNGS: clear to auscultation and percussion with normal breathing effort HEART: regular rate & rhythm and no murmurs and no lower extremity edema ABDOMEN:abdomen soft, non-tender and normal bowel sounds Musculoskeletal:no cyanosis of digits and no clubbing  NEURO: alert & oriented x 3 with fluent speech, no focal motor/sensory deficits  LABORATORY DATA:  I have reviewed the data as listed    Component Value Date/Time   NA 141 04/21/2017 1054   K 4.3 04/21/2017 1054   CL 98 01/10/2016 1246   CO2 25 04/21/2017 1054   GLUCOSE 103 04/21/2017 1054   BUN 20.6 04/21/2017 1054   CREATININE 0.9 04/21/2017 1054   CALCIUM 9.5 04/21/2017 1054   PROT 7.5 04/21/2017 1054   ALBUMIN 3.8 04/21/2017 1054   AST 20 04/21/2017 1054   ALT 17 04/21/2017 1054   ALKPHOS 52 04/21/2017 1054   BILITOT 0.45 04/21/2017 1054   GFRNONAA 79 01/10/2016 1246   GFRAA 91 01/10/2016 1246    No results found for: SPEP, UPEP  Lab Results  Component Value Date   WBC 7.6 04/21/2017   NEUTROABS 4.9 04/21/2017   HGB 13.0 04/21/2017   HCT 41.2 04/21/2017   MCV 96.9 04/21/2017   PLT 262 04/21/2017      Chemistry      Component Value Date/Time   NA 141 04/21/2017 1054   K 4.3 04/21/2017 1054    CL 98 01/10/2016 1246   CO2 25 04/21/2017 1054   BUN 20.6 04/21/2017 1054   CREATININE 0.9 04/21/2017 1054      Component Value Date/Time   CALCIUM 9.5 04/21/2017 1054   ALKPHOS 52 04/21/2017 1054   AST 20 04/21/2017 1054   ALT 17 04/21/2017 1054   BILITOT 0.45 04/21/2017 1054       ASSESSMENT & PLAN:  MGUS (monoclonal gammopathy of unknown significance) I discussed with the patient the natural history of MGUS. Although her workup for myeloma is incomplete, she has no evidence of organ damage. I recommend seeing her back in 12 months for repeat blood work. She agreed with the plan of care   Orders Placed This Encounter  Procedures  . CBC with Differential/Platelet    Standing Status:   Future    Standing Expiration Date:   05/30/2018  . Comprehensive metabolic panel  Standing Status:   Future    Standing Expiration Date:   05/30/2018  . Kappa/lambda light chains    Standing Status:   Future    Standing Expiration Date:   05/30/2018  . Multiple Myeloma Panel (SPEP&IFE w/QIG)    Standing Status:   Future    Standing Expiration Date:   05/30/2018   All questions were answered. The patient knows to call the clinic with any problems, questions or concerns. No barriers to learning was detected. I spent 10 minutes counseling the patient face to face. The total time spent in the appointment was 15 minutes and more than 50% was on counseling and review of test results     Heath Lark, MD 04/27/2017 1:11 PM

## 2017-10-20 ENCOUNTER — Other Ambulatory Visit: Payer: Medicare Other

## 2017-10-27 ENCOUNTER — Ambulatory Visit: Payer: Medicare Other | Admitting: Hematology and Oncology

## 2017-11-15 ENCOUNTER — Other Ambulatory Visit: Payer: Self-pay | Admitting: Neurology

## 2018-04-20 ENCOUNTER — Other Ambulatory Visit: Payer: Medicare Other

## 2018-04-21 ENCOUNTER — Encounter: Payer: Self-pay | Admitting: Neurology

## 2018-04-21 ENCOUNTER — Ambulatory Visit: Payer: Medicare Other | Admitting: Neurology

## 2018-04-21 VITALS — BP 110/70 | HR 84 | Ht 66.0 in | Wt 172.0 lb

## 2018-04-21 DIAGNOSIS — G629 Polyneuropathy, unspecified: Secondary | ICD-10-CM

## 2018-04-21 DIAGNOSIS — I639 Cerebral infarction, unspecified: Secondary | ICD-10-CM | POA: Diagnosis not present

## 2018-04-21 DIAGNOSIS — G6289 Other specified polyneuropathies: Secondary | ICD-10-CM

## 2018-04-21 DIAGNOSIS — R2689 Other abnormalities of gait and mobility: Secondary | ICD-10-CM | POA: Diagnosis not present

## 2018-04-21 NOTE — Patient Instructions (Addendum)
MIND diet

## 2018-04-21 NOTE — Progress Notes (Signed)
[facility]    Provider:  Dr Jaynee Eagles Referring Provider: Deland Pretty, MD Primary Care Physician:  Deland Pretty, MD   CC: stroke and small-fiber neuropathy, falls and imbalance  Interval history 04/21/2018:  She is doing well. No new symptoms of stroke. She still has problem with vision since the stroke. She last lost balance when wearing heels and her shoe turned over but she did not hit the ground more mechanical 3 months otherwise no falls. She feels stable, no worsening of imbalance. Since her husband passed she is not exercising or walking as much, encouraged good diet and exercise and provided literature and mentioned MIND diet.  Symptoms stable. Still living independently, still driving. She has adjusted well after the loss of her husband in 2016. She follows with Dr. Alvy Bimler for MGUS. Neuropathy stable. She had CTS surgery and CTS improved or stable. No memory changes, still independent and performs all ADLs and IADLs.   Interval history 04/23/2017: No falls, everything is going well at home. Her feet tingle every now and then no pain. Her husband died. Her son is helping her with finances. They are selling property in [place]. They have 6 rentals there and she is getting her finances in order, her son [name] is her POA. Her husband died 10-04-15 and this has been a transition. Her balance has improved. No new symptoms.   Interval history 09/02/2016: She is going to have surgery on her right wrist for CTS. She has severe knee pain and is getting injections in her knees. Difficulty with stairs and standing because th eknees are hurting so much. She feels weakness in the legs. She has had 2 falls, she was at her grandson's employment and she sat down on stool and fell off, the second time it was due to balance she became off balance and fell over. She has an L1 fractrure now. She is on Fosamax and vitamin D3 for osteopenia. She continues to have tingling in the feet which is  not painful. She loves Dr. Caralyn Guile. He is great. She is taking asoirin every day and not missing it.   Interval history 01/10/2016: Patient is a lovely 84 year old female who I have seen in the past for an ischemic occipital stroke. She is here for a new problem. The left top of her foot was swollen. A skin biopsy at her pediatrician's diagnosed with what sounds like small fiber neuropathy. She has some cramping of the hands. Her podiatrist diagnosed her with neuropathy. She has numbness and tingling in the feet. She also has balance problems. She has some cramping in the feet. Both of her feet with electric shocks in the toes. Been going on for about a year. Balance is worsening.No falls. Her husband recently passed away. Feels like she is walking on pads. Had a lengthy discussion idiopathic small fiber neuropathy, diabetes which is the most common diagnosis that there are a variety of other conditions that can cause this. Often it is idiopathic. Discussed the pathophysiology and the reason for impaired balance.  HPI: Patricia [name] is a 84 y.o. female here as a follow up. PMHx hypothyroidism, mild tremor. She is doing well. No new events. No other problems. Her LDL was recently taken and is 57. The end of words are sometimes hard to read. The end of the letters are blurred. Her vision has improved.   MRA: IMPRESSION: Abnormal MRA brain showing severe atheromatous changes in distal left posterior cerebral artery and moderate atheromatous narrowing of distal  right posterior cerebral artery. Persistent fetal pattern of origin of right posterior cerebral and hypoplastic terminal right vertebral and A1 segment of right anterior cerebral arteries are benign birth variants  Interval update 02/20/2015: Patient returns today after findings on MRI of the brain indicate an subacute ischemic event in the occipital area. Explained to patient that this is likely the reason for her symptoms that she was seen in the  office last month. The area affected by stroke can also affect vision, and patient still reports blurry vision and difficulty reading. She was accompanied by daughter today. Reviewed all images with this lovely patient and her very nice daughter, showed patient to stroke, discussed the differential and different causes for this. Patient's MRI also showed nonspecific white matter changes likely small vessel chronic ischemic changes. Discussed with daughter and patient that we should start Lipitor, she should continue taking an aspirin for stroke prevention. Also feel as though a complete stroke workup is indicated including imaging of the vessels in the head, echocardiogram with bubble study.  MRi of the brain: IMPRESSION: This is an abnormal MRI of the brain with and without contrast showed the following: 1. Small focus in the left occipital lobe likely representing a small sub-chronic stroke.  2. Age-appropriate minimal atrophy and mild small vessel ischemic change. Recently reviewed images and agree with this report. Patient also brings in labs which show unremarkable CBC, unremarkable CMP, LDL mildly elevated at 129 goal is less than 70, TSH within normal limits.  Initial visit 01/26/2015:She was at home on March 25th, had a headache on the left frontal area, was tring to change the channel, she couldn't get it to change. She felt confused, doesn't have an app to the change the channel. The controls were right in front of her. She lost consciousness from then until midnight. She regained consciousness in the same place, shocked it was midnight. Tried to use cell phone again, still confused at that time. Didn't know how to get to her bedroom, couldn't get the TV off. She turned the power button off and went to bed. Vision is blurry since the episode, right prisms, muted colors for several days afterwards. No fhx seizure, or migraines. No recents infections but did hit her ankle on March 4th a hitch  which was swollen. There was swelling and bruising but no cellulitis. Here with daughter who has never noticed anything unusual. No urinary symptoms. No other episodes of staring, strange behavior, altered awareness, loss of urine, biting of tongue, finding herself on floor or in strange places, getting lost in the car. Doesn't believe she fell asleep. She has a lot of stress, her husband had a stroke. She is main caretaker.    Review of Systems: Patient complains of symptoms per HPI as well as the following symptoms: Denies Chest pain and denies shortness of breath denies fever and systemic symptoms. Pertinent negatives per HPI. All others negative.  Review of Systems: Patient complains of symptoms per HPI as well as the following symptoms: no CP, no SOB. Pertinent negatives per HPI. All others negative   Social History   Socioeconomic History  . Marital status: Married    Spouse name: Joe   . Number of children: 4  . Years of education: 16+  . Highest education level: Not on file  Occupational History  . Not on file  Social Needs  . Financial resource strain: Not on file  . Food insecurity:    Worry: Not on file  Inability: Not on file  . Transportation needs:    Medical: Not on file    Non-medical: Not on file  Tobacco Use  . Smoking status: Never Smoker  . Smokeless tobacco: Never Used  Substance and Sexual Activity  . Alcohol use: Yes    Alcohol/week: 0.0 oz    Comment: Very rarley   . Drug use: No  . Sexual activity: Not on file    Comment: widowed, 2 daughters and 1 son. Retired Education officer, museum  Lifestyle  . Physical activity:    Days per week: Not on file    Minutes per session: Not on file  . Stress: Not on file  Relationships  . Social connections:    Talks on phone: Not on file    Gets together: Not on file    Attends religious service: Not on file    Active member of club or organization: Not on file    Attends meetings of clubs or organizations:  Not on file    Relationship status: Not on file  . Intimate partner violence:    Fear of current or ex partner: Not on file    Emotionally abused: Not on file    Physically abused: Not on file    Forced sexual activity: Not on file  Other Topics Concern  . Not on file  Social History Narrative   Lives at home with husband   Caffeine use:  2 cups coffee every morning   Occass p[employer] and tea    Family History  Problem Relation Age of Onset  . Heart disease Mother   . Throat cancer Maternal Aunt   . Cancer Maternal Aunt        breast ca  . Lupus Daughter   . Anuerysm Daughter        Brain    Past Medical History:  Diagnosis Date  . Hypothyroid   . Stroke [facility])     Past Surgical History:  Procedure Laterality Date  . CATARACT EXTRACTION Bilateral   . EYE SURGERY Left 02/15/15    Laser surgery     Current Outpatient Medications  Medication Sig Dispense Refill  . aspirin 81 MG chewable tablet Chew 81 mg by mouth daily.    Marland Kitchen atorvastatin (LIPITOR) 10 MG tablet TAKE ONE TABLET BY MOUTH ONCE DAILY 90 tablet 2  . Biotin 5000 MCG TABS Take 1 tablet by mouth daily.    Marland Kitchen CALCIUM PO Take 1,000 mg by mouth daily.    . cholecalciferol (VITAMIN D) 1000 units tablet Take 2,000 Units by mouth daily.    . cyclobenzaprine (FLEXERIL) 10 MG tablet Take 10 mg by mouth at bedtime.    . Flaxseed, Linseed, (FLAX SEED OIL PO) Take 1,000 mg by mouth daily.    Marland Kitchen GLUCOSAMINE-CHONDROITIN PO Take 1,500 mg by mouth daily.    Marland Kitchen levothyroxine (SYNTHROID, LEVOTHROID) 88 MCG tablet Take 88 mcg by mouth daily before breakfast.    . MELATONIN PO Take 5 mg by mouth at bedtime as needed.     . mirabegron ER (MYRBETRIQ) 50 MG TB24 tablet Take 50 mg by mouth daily.    . Multiple Vitamin (MULTIVITAMIN) tablet Take 1 tablet by mouth daily.    . Omega-3 Fatty Acids (FISH OIL PO) Take 1,200 mg by mouth daily.    . polyethylene glycol (MIRALAX / GLYCOLAX) packet Take 17 g by mouth daily.    Marland Kitchen POTASSIUM PO Take  550 mg by mouth daily.    Marland Kitchen  Probiotic Product (PROBIOTIC DAILY PO) Take 1 tablet by mouth daily.    . sertraline (ZOLOFT) 50 MG tablet Take 25 mg by mouth daily.    . TURMERIC PO Take 750 mg by mouth daily.    . vitamin B-12 (CYANOCOBALAMIN) 1000 MCG tablet Take 1,000 mcg by mouth daily.    . vitamin C (ASCORBIC ACID) 500 MG tablet Take 500 mg by mouth daily.     No current facility-administered medications for this visit.     Allergies as of 04/21/2018  . (No Known Allergies)    Vitals: BP 110/70   Pulse 84   Ht 5\' 6"  (1.676 m)   Wt 172 lb (78 kg)   BMI 27.76 kg/m  Last Weight:  Wt Readings from Last 1 Encounters:  04/21/18 172 lb (78 kg)   Last Height:   Ht Readings from Last 1 Encounters:  04/21/18 5\' 6"  (1.676 m)       Speech:  Speech is normal; fluent and spontaneous with normal comprehension.  Cognition:  The patient is oriented to person, place, and time;   recent and remote memory intact;   language fluent;   normal attention, concentration,   fund of knowledge Cranial Nerves:  The pupils are equal, round, and reactive to light. Visual fields are full to finger confrontation. Extraocular movements are intact. Trigeminal sensation is intact and the muscles of mastication are normal. The face is symmetric. The palate elevates in the midline. Hearing intact. Voice is normal. Shoulder shrug is normal. The tongue has normal motion without fasciculations.   Sensation: Intact pin prick LE, decreased temperature glove and stocking. Absent vibration at the great toes, few seconds at medial malleoli. Impaired proprioception distally. Sway on Romberg.  Motor observation: Some loss of muscle, atrophy in the intrinsic hand muscles most pronounced in the FDI and hyperthenar areas.   Assessment/Plan: This is a lovely 84 year old female who is here with her daughter for follow-up. She presented in April after an episode of altered awareness and  possible loss of consciousness with vision changes. MRI of the brain showed a subacute left occipital ischemic infarct. MRA showed severe atheromatous changes in distal left posterior cerebral artery and moderate atheromatous narrowing of distal right posterior cerebral artery. She has been recently diagnosed with small-fiber neuropathy in the feet. Exam does show decreased sensation in a glove and stocking distribution in both small and large fibers.  Neuropathy: serum panel was negative except for MGUS and she follows with hematology. Stable.  CTS: She is seeing Dr Apolonio Schneiders, she had surgery and doing well  Falls: Stable, imbalance stable  Stroke: Stable. Follow very closely with primary care for management of vascular risk factors Patient on aspirin, she should continue to take this for stroke prevention. Started patient on Lipitor for LDL goal less than 70. Carotid Dopplers were already completed and unremarkable. echocardiogram with bubble study unremarkable.   I had a long d/w patient about her remote stroke, risk for recurrent stroke/TIAs, personally independently reviewed imaging studies and stroke evaluation results and answered questions.Continue ASA for secondary stroke prevention and maintain strict control of hypertension with blood pressure goal below 130/90, diabetes with hemoglobin A1c goal below 6.5% and lipids with LDL cholesterol goal below 70 mg/dL.. I also advised the patient to eat a healthy diet with plenty of whole grains, cereals, fruits and vegetables, exercise regularly and maintain ideal body weight .  Return to pcp, come to clinic as needed.  Sarina Ill, MD  [facility] [address] [place], [place] [address]  Phone [phone] Fax [phone]  A total of 25 minutes was spent face-to-face with this patient. Over half this time was spent on counseling patient on the imbalance, neuropathy diagnosis and different diagnostic  and therapeutic options available.

## 2018-04-27 ENCOUNTER — Encounter: Payer: Self-pay | Admitting: Hematology and Oncology

## 2018-04-27 ENCOUNTER — Inpatient Hospital Stay: Payer: Medicare Other

## 2018-04-27 ENCOUNTER — Inpatient Hospital Stay: Payer: Medicare Other | Attending: Hematology and Oncology | Admitting: Hematology and Oncology

## 2018-04-27 VITALS — BP 132/86 | HR 86 | Temp 98.4°F | Resp 18 | Ht 66.0 in | Wt 173.2 lb

## 2018-04-27 DIAGNOSIS — G629 Polyneuropathy, unspecified: Secondary | ICD-10-CM

## 2018-04-27 DIAGNOSIS — R296 Repeated falls: Secondary | ICD-10-CM | POA: Diagnosis not present

## 2018-04-27 DIAGNOSIS — Z8673 Personal history of transient ischemic attack (TIA), and cerebral infarction without residual deficits: Secondary | ICD-10-CM | POA: Diagnosis not present

## 2018-04-27 DIAGNOSIS — D472 Monoclonal gammopathy: Secondary | ICD-10-CM | POA: Diagnosis present

## 2018-04-27 DIAGNOSIS — I639 Cerebral infarction, unspecified: Secondary | ICD-10-CM

## 2018-04-27 LAB — CBC WITH DIFFERENTIAL/PLATELET
Basophils Absolute: 0 10*3/uL (ref 0.0–0.1)
Basophils Relative: 1 %
Eosinophils Absolute: 0.1 10*3/uL (ref 0.0–0.5)
Eosinophils Relative: 2 %
HCT: 39.6 % (ref 34.8–46.6)
Hemoglobin: 12.7 g/dL (ref 11.6–15.9)
Lymphocytes Relative: 29 %
Lymphs Abs: 1.4 10*3/uL (ref 0.9–3.3)
MCH: 30.6 pg (ref 25.1–34.0)
MCHC: 32.1 g/dL (ref 31.5–36.0)
MCV: 95.4 fL (ref 79.5–101.0)
Monocytes Absolute: 0.4 10*3/uL (ref 0.1–0.9)
Monocytes Relative: 8 %
Neutro Abs: 2.9 10*3/uL (ref 1.5–6.5)
Neutrophils Relative %: 60 %
Platelets: 228 10*3/uL (ref 145–400)
RBC: 4.15 MIL/uL (ref 3.70–5.45)
RDW: 13.8 % (ref 11.2–14.5)
WBC: 4.8 10*3/uL (ref 3.9–10.3)

## 2018-04-27 LAB — COMPREHENSIVE METABOLIC PANEL
ALT: 22 U/L (ref 0–44)
AST: 24 U/L (ref 15–41)
Albumin: 4.2 g/dL (ref 3.5–5.0)
Alkaline Phosphatase: 44 U/L (ref 38–126)
Anion gap: 8 (ref 5–15)
BUN: 23 mg/dL (ref 8–23)
CO2: 27 mmol/L (ref 22–32)
Calcium: 9.6 mg/dL (ref 8.9–10.3)
Chloride: 103 mmol/L (ref 98–111)
Creatinine, Ser: 0.94 mg/dL (ref 0.44–1.00)
GFR calc Af Amer: >60 mL/min (ref >60)
GFR calc non Af Amer: 53 mL/min — ABNORMAL LOW (ref >60)
Glucose, Bld: 97 mg/dL (ref 70–99)
Potassium: 4.2 mmol/L (ref 3.5–5.1)
Sodium: 138 mmol/L (ref 135–145)
Total Bilirubin: 0.5 mg/dL (ref 0.3–1.2)
Total Protein: 7.9 g/dL (ref 6.5–8.1)

## 2018-04-27 LAB — VITAMIN B12: Vitamin B-12: 662 pg/mL (ref 180–914)

## 2018-04-27 NOTE — Progress Notes (Signed)
[place] OFFICE PROGRESS NOTE  [name] Care Team: Deland Pretty, MD as PCP - General (Internal Medicine) Heath Lark, MD as Consulting Physician (Hematology and Oncology) Melvenia Beam, MD as Consulting Physician (Neurology) Lahoma Rocker, MD as Consulting Physician (Rheumatology)  ASSESSMENT & PLAN:  MGUS (monoclonal gammopathy of unknown significance) Unfortunately, she missed her blood draw a week ago She had multiple tooth of symptoms with possible TIA, worsening neuropathy and falls I recommend repeat blood test today and I will bring her back next week along with her daughter to discuss test results We discussed the natural history of MGUS  Occipital stroke ([place]) She had history of stroke She had multiple what sounds like TIA episodes with intermittent slurring of speech She had new balance issue and recurrent falls I am concerned about new stroke I recommend MRI of the brain for evaluation and she agreed with the plan of care  Polyneuropathy [facility]) She has nonspecific peripheral neuropathy That was worked up extensively in the past I will recheck serum vitamin B12 and add copper level for further assessment  Recurrent falls She has recurrent falls due to unknown stable balance Thankfully, she has not had major injuries I recommend physical therapy evaluation for gait assessment and to see if prescribing a cane is appropriate.   Orders Placed This Encounter  Procedures  . MR Brain W Wo Contrast    Hx stroke, fell x 7, slurred speech    Standing Status:   Future    Standing Expiration Date:   04/27/2019    Order Specific Question:   If indicated for the ordered procedure, I authorize the administration of contrast media per Radiology protocol    Answer:   Yes    Order Specific Question:   What is the [name]'s sedation requirement?    Answer:   No Sedation    Order Specific Question:   Does the [name] have a pacemaker or implanted devices?   Answer:   No    Order Specific Question:   Use SRS Protocol?    Answer:   No    Order Specific Question:   Radiology Contrast Protocol - do NOT remove file path    Answer:   [email or web address]    Order Specific Question:   Preferred imaging location?    Answer:   [facility] (table limit-350 lbs)  . Vitamin B12    Standing Status:   Future    Number of Occurrences:   1    Standing Expiration Date:   06/01/2019  . Copper, serum    Standing Status:   Future    Number of Occurrences:   1    Standing Expiration Date:   06/01/2019  . Ambulatory referral to Physical Therapy    Referral Priority:   Routine    Referral Type:   Physical Medicine    Referral Reason:   Specialty Services Required    Requested Specialty:   Physical Therapy    Number of Visits Requested:   1    INTERVAL HISTORY: Please see below for problem oriented charting. She returns with her daughter, Patricia [name] today According to her daughter, the [name] has multiple episodes of intermittent slurring of speech and had fallen 7 times in the last few months The [name] admits that her balance is not stable She cannot describe whether the neuropathy is a contributing factor or not She describes neuropathy as tingling sensation affecting occasional tips of fingers but she cannot specify which what  part of the leg is affected She also have intermittent leg cramps at night She has missed her recent blood draw a week ago She reported no recent infection, fever or chills She denies new bone pain  SUMMARY OF ONCOLOGIC HISTORY:  Shanitha Twining is here because of recent discovery of IgA MGUS It was discovered during workup for peripheral neuropathy by her neurologist She denies history of abnormal bone pain or bone fracture. [name] denies history of recurrent infection or atypical infections such as shingles of meningitis. Denies chills, night sweats, anorexia or abnormal weight loss. The  [name] has remote history of occipital stroke and chronic neuropathy. She denies persistent neurological deficit. Her main complain some mild neuropathy and gait disturbances.Marland Kitchen Her workup is unremarkable for organ damage.  She is placed on observation.  REVIEW OF SYSTEMS:   Constitutional: Denies fevers, chills or abnormal weight loss Eyes: Denies blurriness of vision Ears, nose, mouth, throat, and face: Denies mucositis or sore throat Respiratory: Denies cough, dyspnea or wheezes Cardiovascular: Denies palpitation, chest discomfort or lower extremity swelling Gastrointestinal:  Denies nausea, heartburn or change in bowel habits Skin: Denies abnormal skin rashes Lymphatics: Denies new lymphadenopathy or easy bruising Behavioral/Psych: Mood is stable, no new changes  All other systems were reviewed with the [name] and are negative.  I have reviewed the past medical history, past surgical history, social history and family history with the [name] and they are unchanged from previous note.  ALLERGIES:  has No Known Allergies.  MEDICATIONS:  Current Outpatient Medications  Medication Sig Dispense Refill  . denosumab (PROLIA) 60 MG/ML SOSY injection Inject 60 mg into the skin every 6 (six) months.    Marland Kitchen aspirin 81 MG chewable tablet Chew 81 mg by mouth daily.    Marland Kitchen atorvastatin (LIPITOR) 10 MG tablet TAKE ONE TABLET BY MOUTH ONCE DAILY 90 tablet 2  . Biotin 5000 MCG TABS Take 1 tablet by mouth daily.    Marland Kitchen CALCIUM PO Take 1,000 mg by mouth daily.    . cholecalciferol (VITAMIN D) 1000 units tablet Take 2,000 Units by mouth daily.    . cyclobenzaprine (FLEXERIL) 10 MG tablet Take 10 mg by mouth at bedtime.    . Flaxseed, Linseed, (FLAX SEED OIL PO) Take 1,000 mg by mouth daily.    Marland Kitchen GLUCOSAMINE-CHONDROITIN PO Take 1,500 mg by mouth daily.    Marland Kitchen levothyroxine (SYNTHROID, LEVOTHROID) 88 MCG tablet Take 88 mcg by mouth daily before breakfast.    . MELATONIN PO Take 5 mg by mouth at bedtime as  needed.     . mirabegron ER (MYRBETRIQ) 50 MG TB24 tablet Take 50 mg by mouth daily.    . Multiple Vitamin (MULTIVITAMIN) tablet Take 1 tablet by mouth daily.    . Omega-3 Fatty Acids (FISH OIL PO) Take 1,200 mg by mouth daily.    . polyethylene glycol (MIRALAX / GLYCOLAX) packet Take 17 g by mouth daily.    . Probiotic Product (PROBIOTIC DAILY PO) Take 1 tablet by mouth daily.    . sertraline (ZOLOFT) 50 MG tablet Take 25 mg by mouth daily.    . TURMERIC PO Take 750 mg by mouth daily.    . vitamin B-12 (CYANOCOBALAMIN) 1000 MCG tablet Take 1,000 mcg by mouth daily.    . vitamin C (ASCORBIC ACID) 500 MG tablet Take 500 mg by mouth daily.     No current facility-administered medications for this visit.     PHYSICAL EXAMINATION: ECOG PERFORMANCE STATUS: 2 - Symptomatic, <50%  confined to bed  Vitals:   04/27/18 1112  BP: 132/86  Pulse: 86  Resp: 18  Temp: 98.4 F (36.9 C)  SpO2: 98%   Filed Weights   04/27/18 1112  Weight: 173 lb 3.2 oz (78.6 kg)    GENERAL:alert, no distress and comfortable SKIN: skin color, texture, turgor are normal, no rashes or significant lesions EYES: normal, Conjunctiva are pink and non-injected, sclera clear OROPHARYNX:no exudate, no erythema and lips, buccal mucosa, and tongue normal  NECK: supple, thyroid normal size, non-tender, without nodularity LYMPH:  no palpable lymphadenopathy in the cervical, axillary or inguinal LUNGS: clear to auscultation and percussion with normal breathing effort HEART: regular rate & rhythm and no murmurs and no lower extremity edema ABDOMEN:abdomen soft, non-tender and normal bowel sounds Musculoskeletal:no cyanosis of digits and no clubbing  NEURO: alert & oriented x 3 with fluent speech, noted unsteady gait, no perceived weakness on exam  LABORATORY DATA:  I have reviewed the data as listed    Component Value Date/Time   NA 138 04/27/2018 1159   NA 141 04/21/2017 1054   K 4.2 04/27/2018 1159   K 4.3  04/21/2017 1054   CL 103 04/27/2018 1159   CO2 27 04/27/2018 1159   CO2 25 04/21/2017 1054   GLUCOSE 97 04/27/2018 1159   GLUCOSE 103 04/21/2017 1054   BUN 23 04/27/2018 1159   BUN 20.6 04/21/2017 1054   CREATININE 0.94 04/27/2018 1159   CREATININE 0.9 04/21/2017 1054   CALCIUM 9.6 04/27/2018 1159   CALCIUM 9.5 04/21/2017 1054   PROT 7.9 04/27/2018 1159   PROT 7.5 04/21/2017 1054   ALBUMIN 4.2 04/27/2018 1159   ALBUMIN 3.8 04/21/2017 1054   AST 24 04/27/2018 1159   AST 20 04/21/2017 1054   ALT 22 04/27/2018 1159   ALT 17 04/21/2017 1054   ALKPHOS 44 04/27/2018 1159   ALKPHOS 52 04/21/2017 1054   BILITOT 0.5 04/27/2018 1159   BILITOT 0.45 04/21/2017 1054   GFRNONAA 53 (L) 04/27/2018 1159   GFRAA >60 04/27/2018 1159    No results found for: SPEP, UPEP  Lab Results  Component Value Date   WBC 4.8 04/27/2018   NEUTROABS 2.9 04/27/2018   HGB 12.7 04/27/2018   HCT 39.6 04/27/2018   MCV 95.4 04/27/2018   PLT 228 04/27/2018      Chemistry      Component Value Date/Time   NA 138 04/27/2018 1159   NA 141 04/21/2017 1054   K 4.2 04/27/2018 1159   K 4.3 04/21/2017 1054   CL 103 04/27/2018 1159   CO2 27 04/27/2018 1159   CO2 25 04/21/2017 1054   BUN 23 04/27/2018 1159   BUN 20.6 04/21/2017 1054   CREATININE 0.94 04/27/2018 1159   CREATININE 0.9 04/21/2017 1054      Component Value Date/Time   CALCIUM 9.6 04/27/2018 1159   CALCIUM 9.5 04/21/2017 1054   ALKPHOS 44 04/27/2018 1159   ALKPHOS 52 04/21/2017 1054   AST 24 04/27/2018 1159   AST 20 04/21/2017 1054   ALT 22 04/27/2018 1159   ALT 17 04/21/2017 1054   BILITOT 0.5 04/27/2018 1159   BILITOT 0.45 04/21/2017 1054      All questions were answered. The [name] knows to call the clinic with any problems, questions or concerns. No barriers to learning was detected.  I spent 25 minutes counseling the [name] face to face. The total time spent in the appointment was 40 minutes and more than 50% was on  counseling  and review of test results  Heath Lark, MD 04/27/2018 1:40 PM

## 2018-04-27 NOTE — Assessment & Plan Note (Signed)
She has nonspecific peripheral neuropathy That was worked up extensively in the past I will recheck serum vitamin B12 and add copper level for further assessment

## 2018-04-27 NOTE — Assessment & Plan Note (Signed)
She had history of stroke She had multiple what sounds like TIA episodes with intermittent slurring of speech She had new balance issue and recurrent falls I am concerned about new stroke I recommend MRI of the brain for evaluation and she agreed with the plan of care

## 2018-04-27 NOTE — Assessment & Plan Note (Signed)
She has recurrent falls due to unknown stable balance Thankfully, she has not had major injuries I recommend physical therapy evaluation for gait assessment and to see if prescribing a cane is appropriate.

## 2018-04-27 NOTE — Assessment & Plan Note (Signed)
Unfortunately, she missed her blood draw a week ago She had multiple tooth of symptoms with possible TIA, worsening neuropathy and falls I recommend repeat blood test today and I will bring her back next week along with her daughter to discuss test results We discussed the natural history of MGUS

## 2018-04-28 LAB — MULTIPLE MYELOMA PANEL, SERUM
Albumin SerPl Elph-Mcnc: 4 g/dL (ref 2.9–4.4)
Albumin/Glob SerPl: 1.3 (ref 0.7–1.7)
Alpha 1: 0.2 g/dL (ref 0.0–0.4)
Alpha2 Glob SerPl Elph-Mcnc: 0.6 g/dL (ref 0.4–1.0)
B-Globulin SerPl Elph-Mcnc: 1.7 g/dL — ABNORMAL HIGH (ref 0.7–1.3)
Gamma Glob SerPl Elph-Mcnc: 0.7 g/dL (ref 0.4–1.8)
Globulin, Total: 3.2 g/dL (ref 2.2–3.9)
IgA: 1481 mg/dL — ABNORMAL HIGH (ref 64–422)
IgG (Immunoglobin G), Serum: 768 mg/dL (ref 700–1600)
IgM (Immunoglobulin M), Srm: 103 mg/dL (ref 26–217)
M Protein SerPl Elph-Mcnc: 0.9 g/dL — ABNORMAL HIGH
Total Protein ELP: 7.2 g/dL (ref 6.0–8.5)

## 2018-04-28 LAB — KAPPA/LAMBDA LIGHT CHAINS
Kappa free light chain: 33.4 mg/L — ABNORMAL HIGH (ref 3.3–19.4)
Kappa, lambda light chain ratio: 2.74 — ABNORMAL HIGH (ref 0.26–1.65)
Lambda free light chains: 12.2 mg/L (ref 5.7–26.3)

## 2018-04-29 LAB — COPPER, SERUM: Copper: 78 ug/dL (ref 72–166)

## 2018-05-04 ENCOUNTER — Ambulatory Visit (HOSPITAL_COMMUNITY)
Admission: RE | Admit: 2018-05-04 | Discharge: 2018-05-04 | Disposition: A | Discharge status: Home / Self Care | Payer: Medicare Other | Source: Ambulatory Visit | Attending: Hematology and Oncology | Admitting: Hematology and Oncology

## 2018-05-04 DIAGNOSIS — D472 Monoclonal gammopathy: Secondary | ICD-10-CM | POA: Diagnosis present

## 2018-05-04 MED ORDER — GADOBENATE DIMEGLUMINE 529 MG/ML IV SOLN
15.0000 mL | Freq: Once | INTRAVENOUS | Status: AC | PRN
  Administered 2018-05-04: 15 mL via INTRAVENOUS

## 2018-05-05 ENCOUNTER — Inpatient Hospital Stay (HOSPITAL_BASED_OUTPATIENT_CLINIC_OR_DEPARTMENT_OTHER): Payer: Medicare Other | Admitting: Hematology and Oncology

## 2018-05-05 ENCOUNTER — Telehealth: Payer: Self-pay | Admitting: Hematology and Oncology

## 2018-05-05 ENCOUNTER — Encounter: Payer: Self-pay | Admitting: Hematology and Oncology

## 2018-05-05 DIAGNOSIS — D472 Monoclonal gammopathy: Secondary | ICD-10-CM

## 2018-05-05 DIAGNOSIS — I639 Cerebral infarction, unspecified: Secondary | ICD-10-CM | POA: Diagnosis not present

## 2018-05-05 DIAGNOSIS — G629 Polyneuropathy, unspecified: Secondary | ICD-10-CM | POA: Diagnosis not present

## 2018-05-05 DIAGNOSIS — R296 Repeated falls: Secondary | ICD-10-CM | POA: Diagnosis not present

## 2018-05-05 NOTE — Assessment & Plan Note (Signed)
She has recent recurrent falls She is referred to see physical therapy and rehabilitation for physical assessment

## 2018-05-05 NOTE — Assessment & Plan Note (Addendum)
She has diagnosis of nonspecific peripheral neuropathy Serum copper and vitamin B12 level are stable Observe only

## 2018-05-05 NOTE — Telephone Encounter (Signed)
Per 7/30 los.  Gave patient AVS and calendar.

## 2018-05-05 NOTE — Assessment & Plan Note (Signed)
Repeat blood work showed no evidence of endorgan damage She does not need treatment for MGUS We discussed the natural history of MGUS I plan to see her back in the year with history, physical examination and blood work to be done a week ahead of time I reinforced the importance of annual influenza vaccination

## 2018-05-05 NOTE — Progress Notes (Signed)
[facility] OFFICE PROGRESS NOTE  Patient Care Team: Deland Pretty, MD as PCP - General (Internal Medicine) Heath Lark, MD as Consulting Physician (Hematology and Oncology) Melvenia Beam, MD as Consulting Physician (Neurology) Lahoma Rocker, MD as Consulting Physician (Rheumatology)  ASSESSMENT & PLAN:  MGUS (monoclonal gammopathy of unknown significance) Repeat blood work showed no evidence of endorgan damage She does not need treatment for MGUS We discussed the natural history of MGUS I plan to see her back in the year with history, physical examination and blood work to be done a week ahead of time I reinforced the importance of annual influenza vaccination  Polyneuropathy ([place]) She has diagnosis of nonspecific peripheral neuropathy Serum copper and vitamin B12 level are stable Observe only  Recurrent falls She has recent recurrent falls She is referred to see physical therapy and rehabilitation for physical assessment  Occipital stroke [facility]) Recent MRI is negative for new stroke   No orders of the defined types were placed in this encounter.   INTERVAL HISTORY: Please see below for problem oriented charting. She returns to review test results, ordered last week for follow-up on MGUS and recurrent falls She denies further falls since the last time I saw her No new neurological deficits No recent infection  SUMMARY OF ONCOLOGIC HISTORY:  Patricia [name] is here because of recent discovery of IgA MGUS It was discovered during workup for peripheral neuropathy by her neurologist She denies history of abnormal bone pain or bone fracture. Patient denies history of recurrent infection or atypical infections such as shingles of meningitis. Denies chills, night sweats, anorexia or abnormal weight loss. The patient has remote history of occipital stroke and chronic neuropathy. She denies persistent neurological deficit. Her main complain some mild neuropathy  and gait disturbances.Marland Kitchen Her workup is unremarkable for organ damage.  She is placed on observation.  REVIEW OF SYSTEMS:   Constitutional: Denies fevers, chills or abnormal weight loss Eyes: Denies blurriness of vision Ears, nose, mouth, throat, and face: Denies mucositis or sore throat Respiratory: Denies cough, dyspnea or wheezes Cardiovascular: Denies palpitation, chest discomfort or lower extremity swelling Gastrointestinal:  Denies nausea, heartburn or change in bowel habits Skin: Denies abnormal skin rashes Lymphatics: Denies new lymphadenopathy or easy bruising Neurological:Denies numbness, tingling or new weaknesses Behavioral/Psych: Mood is stable, no new changes  All other systems were reviewed with the patient and are negative.  I have reviewed the past medical history, past surgical history, social history and family history with the patient and they are unchanged from previous note.  ALLERGIES:  has No Known Allergies.  MEDICATIONS:  Current Outpatient Medications  Medication Sig Dispense Refill  . aspirin 81 MG chewable tablet Chew 81 mg by mouth daily.    Marland Kitchen atorvastatin (LIPITOR) 10 MG tablet TAKE ONE TABLET BY MOUTH ONCE DAILY 90 tablet 2  . Biotin 5000 MCG TABS Take 1 tablet by mouth daily.    Marland Kitchen CALCIUM PO Take 1,000 mg by mouth daily.    . cholecalciferol (VITAMIN D) 1000 units tablet Take 2,000 Units by mouth daily.    . cyclobenzaprine (FLEXERIL) 10 MG tablet Take 10 mg by mouth at bedtime.    Marland Kitchen denosumab (PROLIA) 60 MG/ML SOSY injection Inject 60 mg into the skin every 6 (six) months.    . Flaxseed, Linseed, (FLAX SEED OIL PO) Take 1,000 mg by mouth daily.    Marland Kitchen GLUCOSAMINE-CHONDROITIN PO Take 1,500 mg by mouth daily.    Marland Kitchen levothyroxine (SYNTHROID, LEVOTHROID) 88 MCG  tablet Take 88 mcg by mouth daily before breakfast.    . MELATONIN PO Take 5 mg by mouth at bedtime as needed.     . mirabegron ER (MYRBETRIQ) 50 MG TB24 tablet Take 50 mg by mouth daily.    .  Multiple Vitamin (MULTIVITAMIN) tablet Take 1 tablet by mouth daily.    . Omega-3 Fatty Acids (FISH OIL PO) Take 1,200 mg by mouth daily.    . polyethylene glycol (MIRALAX / GLYCOLAX) packet Take 17 g by mouth daily.    . Probiotic Product (PROBIOTIC DAILY PO) Take 1 tablet by mouth daily.    . sertraline (ZOLOFT) 50 MG tablet Take 25 mg by mouth daily.    . TURMERIC PO Take 750 mg by mouth daily.    . vitamin B-12 (CYANOCOBALAMIN) 1000 MCG tablet Take 1,000 mcg by mouth daily.    . vitamin C (ASCORBIC ACID) 500 MG tablet Take 500 mg by mouth daily.     No current facility-administered medications for this visit.     PHYSICAL EXAMINATION: ECOG PERFORMANCE STATUS: 1 - Symptomatic but completely ambulatory  Vitals:   05/05/18 1126  BP: 118/81  Pulse: 80  Resp: 18  Temp: 99.1 F (37.3 C)  SpO2: 98%   Filed Weights   05/05/18 1126  Weight: 177 lb 3.2 oz (80.4 kg)    GENERAL:alert, no distress and comfortable NEURO: alert & oriented x 3 with fluent speech  LABORATORY DATA:  I have reviewed the data as listed    Component Value Date/Time   NA 138 04/27/2018 1159   NA 141 04/21/2017 1054   K 4.2 04/27/2018 1159   K 4.3 04/21/2017 1054   CL 103 04/27/2018 1159   CO2 27 04/27/2018 1159   CO2 25 04/21/2017 1054   GLUCOSE 97 04/27/2018 1159   GLUCOSE 103 04/21/2017 1054   BUN 23 04/27/2018 1159   BUN 20.6 04/21/2017 1054   CREATININE 0.94 04/27/2018 1159   CREATININE 0.9 04/21/2017 1054   CALCIUM 9.6 04/27/2018 1159   CALCIUM 9.5 04/21/2017 1054   PROT 7.9 04/27/2018 1159   PROT 7.5 04/21/2017 1054   ALBUMIN 4.2 04/27/2018 1159   ALBUMIN 3.8 04/21/2017 1054   AST 24 04/27/2018 1159   AST 20 04/21/2017 1054   ALT 22 04/27/2018 1159   ALT 17 04/21/2017 1054   ALKPHOS 44 04/27/2018 1159   ALKPHOS 52 04/21/2017 1054   BILITOT 0.5 04/27/2018 1159   BILITOT 0.45 04/21/2017 1054   GFRNONAA 53 (L) 04/27/2018 1159   GFRAA >60 04/27/2018 1159    No results found for:  SPEP, UPEP  Lab Results  Component Value Date   WBC 4.8 04/27/2018   NEUTROABS 2.9 04/27/2018   HGB 12.7 04/27/2018   HCT 39.6 04/27/2018   MCV 95.4 04/27/2018   PLT 228 04/27/2018      Chemistry      Component Value Date/Time   NA 138 04/27/2018 1159   NA 141 04/21/2017 1054   K 4.2 04/27/2018 1159   K 4.3 04/21/2017 1054   CL 103 04/27/2018 1159   CO2 27 04/27/2018 1159   CO2 25 04/21/2017 1054   BUN 23 04/27/2018 1159   BUN 20.6 04/21/2017 1054   CREATININE 0.94 04/27/2018 1159   CREATININE 0.9 04/21/2017 1054      Component Value Date/Time   CALCIUM 9.6 04/27/2018 1159   CALCIUM 9.5 04/21/2017 1054   ALKPHOS 44 04/27/2018 1159   ALKPHOS 52 04/21/2017 1054  AST 24 04/27/2018 1159   AST 20 04/21/2017 1054   ALT 22 04/27/2018 1159   ALT 17 04/21/2017 1054   BILITOT 0.5 04/27/2018 1159   BILITOT 0.45 04/21/2017 1054       RADIOGRAPHIC STUDIES: I have personally reviewed the radiological images as listed and agreed with the findings in the report. Mr Jeri Cos Wo Contrast  Result Date: 05/04/2018 CLINICAL DATA:  Monoclonal gammopathy of unknown significance. Balance problems recently, evaluate for mini strokes EXAM: MRI HEAD WITHOUT AND WITH CONTRAST TECHNIQUE: Multiplanar, multiecho pulse sequences of the brain and surrounding structures were obtained without and with intravenous contrast. CONTRAST:  53mL MULTIHANCE GADOBENATE DIMEGLUMINE 529 MG/ML IV SOLN COMPARISON:  None. FINDINGS: Brain: No acute infarction, hemorrhage, hydrocephalus, extra-axial collection or mass lesion. Mild FLAIR hyperintensity in the cerebral white matter attributed to chronic small vessel ischemia. Mild for age cerebral volume loss. No brainstem or cerebellar infarcts noted. Symmetric normal temporal bone signal. 4 mm cystic intensity in the right pituitary gland, considered incidental. Vascular: Major flow voids and vascular enhancements are preserved, including vertebrobasilar. Skull and upper  cervical spine: No evidence of myeloma or other focal lesion. Sinuses/Orbits: Negative IMPRESSION: Unremarkable appearance of the brain for age. No cerebellar infarcts or other explanation for dizziness. Electronically Signed   By: Monte Fantasia M.D.   On: 05/04/2018 14:44    All questions were answered. The patient knows to call the clinic with any problems, questions or concerns. No barriers to learning was detected.  I spent 15 minutes counseling the patient face to face. The total time spent in the appointment was 20 minutes and more than 50% was on counseling and review of test results  Heath Lark, MD 05/05/2018 1:49 PM

## 2018-05-05 NOTE — Assessment & Plan Note (Signed)
Recent MRI is negative for new stroke

## 2018-08-17 ENCOUNTER — Other Ambulatory Visit: Payer: Self-pay | Admitting: Neurology

## 2018-12-22 ENCOUNTER — Encounter (HOSPITAL_COMMUNITY): Payer: Self-pay | Admitting: Internal Medicine

## 2018-12-22 ENCOUNTER — Other Ambulatory Visit: Payer: Self-pay

## 2018-12-22 ENCOUNTER — Observation Stay (HOSPITAL_COMMUNITY)
Admission: EM | Admit: 2018-12-22 | Discharge: 2018-12-28 | Disposition: A | Discharge status: Skilled Nursing Facility | Payer: Medicare Other | Attending: Nephrology | Admitting: Nephrology

## 2018-12-22 ENCOUNTER — Emergency Department (HOSPITAL_COMMUNITY): Payer: Medicare Other

## 2018-12-22 DIAGNOSIS — E039 Hypothyroidism, unspecified: Secondary | ICD-10-CM | POA: Diagnosis not present

## 2018-12-22 DIAGNOSIS — R2681 Unsteadiness on feet: Secondary | ICD-10-CM | POA: Diagnosis not present

## 2018-12-22 DIAGNOSIS — E785 Hyperlipidemia, unspecified: Secondary | ICD-10-CM | POA: Insufficient documentation

## 2018-12-22 DIAGNOSIS — W19XXXA Unspecified fall, initial encounter: Secondary | ICD-10-CM | POA: Diagnosis not present

## 2018-12-22 DIAGNOSIS — Z79899 Other long term (current) drug therapy: Secondary | ICD-10-CM | POA: Insufficient documentation

## 2018-12-22 DIAGNOSIS — Z8673 Personal history of transient ischemic attack (TIA), and cerebral infarction without residual deficits: Secondary | ICD-10-CM | POA: Diagnosis not present

## 2018-12-22 DIAGNOSIS — Z79890 Hormone replacement therapy (postmenopausal): Secondary | ICD-10-CM | POA: Insufficient documentation

## 2018-12-22 DIAGNOSIS — Z7982 Long term (current) use of aspirin: Secondary | ICD-10-CM | POA: Insufficient documentation

## 2018-12-22 DIAGNOSIS — S3210XA Unspecified fracture of sacrum, initial encounter for closed fracture: Secondary | ICD-10-CM | POA: Diagnosis present

## 2018-12-22 DIAGNOSIS — Z791 Long term (current) use of non-steroidal anti-inflammatories (NSAID): Secondary | ICD-10-CM | POA: Diagnosis not present

## 2018-12-22 DIAGNOSIS — S22000A Wedge compression fracture of unspecified thoracic vertebra, initial encounter for closed fracture: Secondary | ICD-10-CM | POA: Diagnosis present

## 2018-12-22 DIAGNOSIS — M6281 Muscle weakness (generalized): Secondary | ICD-10-CM | POA: Diagnosis not present

## 2018-12-22 DIAGNOSIS — R739 Hyperglycemia, unspecified: Secondary | ICD-10-CM | POA: Diagnosis present

## 2018-12-22 DIAGNOSIS — R296 Repeated falls: Secondary | ICD-10-CM

## 2018-12-22 DIAGNOSIS — R030 Elevated blood-pressure reading, without diagnosis of hypertension: Secondary | ICD-10-CM | POA: Diagnosis present

## 2018-12-22 DIAGNOSIS — K5900 Constipation, unspecified: Secondary | ICD-10-CM | POA: Insufficient documentation

## 2018-12-22 DIAGNOSIS — M549 Dorsalgia, unspecified: Secondary | ICD-10-CM

## 2018-12-22 DIAGNOSIS — S22070A Wedge compression fracture of T9-T10 vertebra, initial encounter for closed fracture: Secondary | ICD-10-CM | POA: Diagnosis present

## 2018-12-22 HISTORY — DX: Depression, unspecified: F32.A

## 2018-12-22 HISTORY — DX: Pure hypercholesterolemia, unspecified: E78.00

## 2018-12-22 HISTORY — DX: Repeated falls: R29.6

## 2018-12-22 HISTORY — DX: Gastro-esophageal reflux disease without esophagitis: K21.9

## 2018-12-22 HISTORY — DX: Unspecified urinary incontinence: R32

## 2018-12-22 HISTORY — DX: Major depressive disorder, single episode, unspecified: F32.9

## 2018-12-22 LAB — CBC WITH DIFFERENTIAL/PLATELET
Abs Immature Granulocytes: 0.16 10*3/uL — ABNORMAL HIGH (ref 0.00–0.07)
Basophils Absolute: 0 10*3/uL (ref 0.0–0.1)
Basophils Relative: 1 %
Eosinophils Absolute: 0 10*3/uL (ref 0.0–0.5)
Eosinophils Relative: 0 %
HCT: 40.7 % (ref 36.0–46.0)
Hemoglobin: 12.7 g/dL (ref 12.0–15.0)
Immature Granulocytes: 2 %
Lymphocytes Relative: 9 %
Lymphs Abs: 0.8 10*3/uL (ref 0.7–4.0)
MCH: 29.6 pg (ref 26.0–34.0)
MCHC: 31.2 g/dL (ref 30.0–36.0)
MCV: 94.9 fL (ref 80.0–100.0)
Monocytes Absolute: 0.3 10*3/uL (ref 0.1–1.0)
Monocytes Relative: 4 %
Neutro Abs: 7.1 10*3/uL (ref 1.7–7.7)
Neutrophils Relative %: 84 %
Platelets: 239 10*3/uL (ref 150–400)
RBC: 4.29 MIL/uL (ref 3.87–5.11)
RDW: 13.3 % (ref 11.5–15.5)
WBC: 8.4 10*3/uL (ref 4.0–10.5)
nRBC: 0 % (ref 0.0–0.2)

## 2018-12-22 LAB — URINALYSIS, ROUTINE W REFLEX MICROSCOPIC
Bilirubin Urine: NEGATIVE
Glucose, UA: NEGATIVE mg/dL
Hgb urine dipstick: NEGATIVE
Ketones, ur: NEGATIVE mg/dL
Leukocytes,Ua: NEGATIVE
Nitrite: NEGATIVE
Protein, ur: NEGATIVE mg/dL
Specific Gravity, Urine: 1.005 (ref 1.005–1.030)
pH: 8 (ref 5.0–8.0)

## 2018-12-22 LAB — BASIC METABOLIC PANEL
Anion gap: 7 (ref 5–15)
BUN: 10 mg/dL (ref 8–23)
CO2: 26 mmol/L (ref 22–32)
Calcium: 8.9 mg/dL (ref 8.9–10.3)
Chloride: 106 mmol/L (ref 98–111)
Creatinine, Ser: 0.72 mg/dL (ref 0.44–1.00)
GFR calc Af Amer: >60 mL/min (ref >60)
GFR calc non Af Amer: >60 mL/min (ref >60)
Glucose, Bld: 135 mg/dL — ABNORMAL HIGH (ref 70–99)
Potassium: 4 mmol/L (ref 3.5–5.1)
Sodium: 139 mmol/L (ref 135–145)

## 2018-12-22 MED ORDER — KETOROLAC TROMETHAMINE 15 MG/ML IJ SOLN: 15.0000 mg | Freq: Four times a day (QID) | INTRAMUSCULAR | Status: AC | PRN

## 2018-12-22 MED ORDER — LEVOTHYROXINE SODIUM 88 MCG PO TABS
88.0000 ug | ORAL_TABLET | Freq: Every day | ORAL | Status: DC
  Administered 2018-12-23 – 2018-12-28 (×5): 88 ug via ORAL
  Filled 2018-12-22 (×5): qty 1

## 2018-12-22 MED ORDER — ATORVASTATIN CALCIUM 10 MG PO TABS
10.0000 mg | ORAL_TABLET | Freq: Every day | ORAL | Status: DC
  Administered 2018-12-23 – 2018-12-28 (×6): 10 mg via ORAL
  Filled 2018-12-22 (×6): qty 1

## 2018-12-22 MED ORDER — MELATONIN 3 MG PO TABS
9.0000 mg | ORAL_TABLET | Freq: Every evening | ORAL | Status: DC | PRN
  Administered 2018-12-22 – 2018-12-27 (×6): 9 mg via ORAL
  Filled 2018-12-22 (×7): qty 3

## 2018-12-22 MED ORDER — MORPHINE SULFATE (PF) 2 MG/ML IV SOLN
2.0000 mg | Freq: Once | INTRAVENOUS | Status: AC
  Administered 2018-12-22: 2 mg via INTRAVENOUS
  Filled 2018-12-22: qty 1

## 2018-12-22 MED ORDER — KETOROLAC TROMETHAMINE 15 MG/ML IJ SOLN: 15.0000 mg | Freq: Four times a day (QID) | INTRAMUSCULAR | Status: DC | PRN

## 2018-12-22 MED ORDER — PREDNISONE 5 MG PO TABS: 5.0000 mg | ORAL_TABLET | ORAL | Status: DC

## 2018-12-22 MED ORDER — HYDRALAZINE HCL 20 MG/ML IJ SOLN
5.0000 mg | INTRAMUSCULAR | Status: DC | PRN
  Administered 2018-12-22 – 2018-12-25 (×2): 5 mg via INTRAVENOUS
  Filled 2018-12-22 (×2): qty 1

## 2018-12-22 MED ORDER — ASPIRIN 81 MG PO CHEW
81.0000 mg | CHEWABLE_TABLET | Freq: Every day | ORAL | Status: DC
  Administered 2018-12-23 – 2018-12-28 (×6): 81 mg via ORAL
  Filled 2018-12-22 (×6): qty 1

## 2018-12-22 MED ORDER — ONDANSETRON HCL 4 MG PO TABS: 4.0000 mg | ORAL_TABLET | Freq: Four times a day (QID) | ORAL | Status: DC | PRN

## 2018-12-22 MED ORDER — METHYLPREDNISOLONE 4 MG PO TBPK: 4.0000 mg | ORAL_TABLET | ORAL | Status: AC

## 2018-12-22 MED ORDER — ACETAMINOPHEN 650 MG RE SUPP: 650.0000 mg | Freq: Four times a day (QID) | RECTAL | Status: DC | PRN

## 2018-12-22 MED ORDER — WHITE PETROLATUM EX OINT
TOPICAL_OINTMENT | CUTANEOUS | Status: AC
  Administered 2018-12-22: 0.2
  Filled 2018-12-22: qty 28.35

## 2018-12-22 MED ORDER — METHYLPREDNISOLONE 4 MG PO TBPK
8.0000 mg | ORAL_TABLET | Freq: Every morning | ORAL | Status: AC
  Filled 2018-12-22 (×2): qty 21

## 2018-12-22 MED ORDER — METHYLPREDNISOLONE 4 MG PO TBPK
4.0000 mg | ORAL_TABLET | Freq: Three times a day (TID) | ORAL | Status: AC
  Administered 2018-12-23 (×3): 4 mg via ORAL

## 2018-12-22 MED ORDER — LORAZEPAM 2 MG/ML IJ SOLN
1.0000 mg | Freq: Once | INTRAMUSCULAR | Status: AC
  Administered 2018-12-22: 1 mg via INTRAVENOUS
  Filled 2018-12-22: qty 1

## 2018-12-22 MED ORDER — METHYLPREDNISOLONE 4 MG PO TBPK
8.0000 mg | ORAL_TABLET | Freq: Every evening | ORAL | Status: AC
  Administered 2018-12-23: 8 mg via ORAL

## 2018-12-22 MED ORDER — MORPHINE SULFATE (PF) 2 MG/ML IV SOLN
2.0000 mg | INTRAVENOUS | Status: DC | PRN
  Administered 2018-12-22 – 2018-12-23 (×2): 2 mg via INTRAVENOUS
  Filled 2018-12-22 (×2): qty 1

## 2018-12-22 MED ORDER — POLYETHYLENE GLYCOL 3350 17 G PO PACK
17.0000 g | PACK | Freq: Every day | ORAL | Status: DC | PRN
  Administered 2018-12-26: 17 g via ORAL
  Filled 2018-12-22 (×2): qty 1

## 2018-12-22 MED ORDER — ENOXAPARIN SODIUM 40 MG/0.4ML ~~LOC~~ SOLN
40.0000 mg | SUBCUTANEOUS | Status: DC
  Administered 2018-12-22: 40 mg via SUBCUTANEOUS
  Filled 2018-12-22: qty 0.4

## 2018-12-22 MED ORDER — ONDANSETRON HCL 4 MG/2ML IJ SOLN: 4.0000 mg | Freq: Four times a day (QID) | INTRAMUSCULAR | Status: DC | PRN

## 2018-12-22 MED ORDER — ACETAMINOPHEN 325 MG PO TABS
650.0000 mg | ORAL_TABLET | Freq: Four times a day (QID) | ORAL | Status: DC | PRN
  Administered 2018-12-24 – 2018-12-25 (×2): 650 mg via ORAL
  Filled 2018-12-22 (×3): qty 2

## 2018-12-22 MED ORDER — SERTRALINE HCL 50 MG PO TABS
50.0000 mg | ORAL_TABLET | Freq: Every day | ORAL | Status: DC
  Administered 2018-12-23 – 2018-12-28 (×6): 50 mg via ORAL
  Filled 2018-12-22 (×6): qty 1

## 2018-12-22 MED ORDER — METHYLPREDNISOLONE 4 MG PO TBPK
4.0000 mg | ORAL_TABLET | Freq: Four times a day (QID) | ORAL | Status: AC
  Administered 2018-12-24 – 2018-12-27 (×10): 4 mg via ORAL

## 2018-12-22 MED ORDER — METHYLPREDNISOLONE 4 MG PO TBPK
8.0000 mg | ORAL_TABLET | Freq: Every evening | ORAL | Status: AC
  Administered 2018-12-22: 8 mg via ORAL

## 2018-12-22 MED ORDER — FESOTERODINE FUMARATE ER 4 MG PO TB24
4.0000 mg | ORAL_TABLET | Freq: Every day | ORAL | Status: DC
  Administered 2018-12-23 – 2018-12-28 (×6): 4 mg via ORAL
  Filled 2018-12-22 (×6): qty 1

## 2018-12-22 NOTE — ED Notes (Signed)
ED TO INPATIENT HANDOFF REPORT  ED Nurse Name and Phone #:  Suezanne Jacquet [phone]  S Name/Age/Gender Patricia [name] 85 y.o. female Room/Bed: H019C/H019C  Code Status   Code Status: Full Code  Home/SNF/Other Home Patient oriented to: self, place, time and situation Is this baseline? Yes   Triage Complete: Triage complete  Chief Complaint fall  Triage Note [facility] on 2/29, has been able to ambulate normally since, but last night tailbone began to hurt, this am so bad she could not get out of bed. Was assisted to bed by daughter, called PCP and told to come here. Vitals WDL    Allergies No Known Allergies  Level of Care/Admitting Diagnosis ED Disposition    ED Disposition Condition Comment   Admit  Hospital Area: [facility] [[id number]]  Level of Care: Med-Surg [16]  I expect the patient will be discharged within 24 hours: Yes  LOW acuity---Tx typically complete <24 hrs---ACUTE conditions typically can be evaluated <24 hours---LABS likely to return to acceptable levels <24 hours---IS near functional baseline---EXPECTED to return to current living arrangement---NOT newly hypoxic: Meets criteria for 5C-Observation unit  Diagnosis: Intractable back pain [010932]  Admitting Physician: Reubin Milan [[id number]]  Attending Physician: Reubin Milan [[id number]]  PT Class (Do Not Modify): Observation [104]  PT Acc Code (Do Not Modify): Observation [10022]       B Medical/Surgery History Past Medical History:  Diagnosis Date  . Hypothyroid   . Stroke [facility])    Past Surgical History:  Procedure Laterality Date  . CATARACT EXTRACTION Bilateral   . EYE SURGERY Left 02/15/15    Laser surgery      A IV Location/Drains/Wounds Patient Lines/Drains/Airways Status   Active Line/Drains/Airways    None          Intake/Output Last 24 hours No intake or output data in the 24 hours ending 12/22/18 1908  Labs/Imaging Results for orders placed or performed during  the hospital encounter of 12/22/18 (from the past 48 hour(s))  Urinalysis, Routine w reflex microscopic     Status: Abnormal   Collection Time: 12/22/18  3:41 PM  Result Value Ref Range   Color, Urine STRAW (A) YELLOW   APPearance CLEAR CLEAR   Specific Gravity, Urine 1.005 1.005 - 1.030   pH 8.0 5.0 - 8.0   Glucose, UA NEGATIVE NEGATIVE mg/dL   Hgb urine dipstick NEGATIVE NEGATIVE   Bilirubin Urine NEGATIVE NEGATIVE   Ketones, ur NEGATIVE NEGATIVE mg/dL   Protein, ur NEGATIVE NEGATIVE mg/dL   Nitrite NEGATIVE NEGATIVE   Leukocytes,Ua NEGATIVE NEGATIVE    Comment: Performed at [facility] [address], [place], [place] [address]  Basic metabolic panel     Status: Abnormal   Collection Time: 12/22/18  4:20 PM  Result Value Ref Range   Sodium 139 135 - 145 mmol/L   Potassium 4.0 3.5 - 5.1 mmol/L   Chloride 106 98 - 111 mmol/L   CO2 26 22 - 32 mmol/L   Glucose, Bld 135 (H) 70 - 99 mg/dL   BUN 10 8 - 23 mg/dL   Creatinine, Ser 0.72 0.44 - 1.00 mg/dL   Calcium 8.9 8.9 - 10.3 mg/dL   GFR calc non Af Amer >60 >60 mL/min   GFR calc Af Amer >60 >60 mL/min   Anion gap 7 5 - 15    Comment: Performed at [facility], [place] [address], [place], [place] [address]  CBC with Differential  Status: Abnormal   Collection Time: 12/22/18  4:20 PM  Result Value Ref Range   WBC 8.4 4.0 - 10.5 K/uL   RBC 4.29 3.87 - 5.11 MIL/uL   Hemoglobin 12.7 12.0 - 15.0 g/dL   HCT 40.7 36.0 - 46.0 %   MCV 94.9 80.0 - 100.0 fL   MCH 29.6 26.0 - 34.0 pg   MCHC 31.2 30.0 - 36.0 g/dL   RDW 13.3 11.5 - 15.5 %   Platelets 239 150 - 400 K/uL   nRBC 0.0 0.0 - 0.2 %   Neutrophils Relative % 84 %   Neutro Abs 7.1 1.7 - 7.7 K/uL   Lymphocytes Relative 9 %   Lymphs Abs 0.8 0.7 - 4.0 K/uL   Monocytes Relative 4 %   Monocytes Absolute 0.3 0.1 - 1.0 K/uL   Eosinophils Relative 0 %   Eosinophils Absolute 0.0 0.0 - 0.5 K/uL   Basophils Relative 1 %   Basophils Absolute 0.0 0.0 - 0.1 K/uL   Immature  Granulocytes 2 %   Abs Immature Granulocytes 0.16 (H) 0.00 - 0.07 K/uL    Comment: Performed at [facility] [address] [place], [place] [address]   Mr Thoracic Spine Wo Contrast  Result Date: 12/22/2018 CLINICAL DATA:  [facility] 12/05/2018.  Continued back pain. EXAM: MRI THORACIC AND LUMBAR SPINE WITHOUT CONTRAST TECHNIQUE: Multiplanar and multiecho pulse sequences of the thoracic and lumbar spine were obtained without intravenous contrast. COMPARISON:  Plain films 12/08/2018 FINDINGS: MRI THORACIC SPINE FINDINGS Alignment: Slight increased kyphosis due to chronic deformity T12. No subluxation. Vertebrae: Acute compression fracture T10, superior endplate depression, minimal retropulsion posterosuperior aspect. No significant wedging. Chronic compression fracture T12, loss of approximately 1/3 vertebral body height, chronically retropulsed fragment into the canal roughly 4 mm. Cord: No cord compression or abnormal cord signal. No epidural hematoma. Paraspinal and other soft tissues: There is a paravertebral hematoma on the LEFT at T10, medial to the aorta. Disc levels: No significant thoracic disc protrusion is evident. There are no areas of compressive spinal stenosis. MRI LUMBAR SPINE FINDINGS Segmentation:  Standard. Alignment:  Anatomic except for 2 mm facet mediated slip L4-5. Vertebrae: No lumbar compression fracture. Fractures of the S2 and S3 sacral segments are identified however. There appears to be sacral canal stenosis based on mildly retropulsed S3. Extension of fracture into the sacral alae is difficult to assess. Conus medullaris and cauda equina: Conus extends to the L1 level. Conus and cauda equina appear normal. No definite epidural hematoma in the sacral canal Paraspinal and other soft tissues: Renal cystic disease, incompletely evaluated. Disc levels: L1-L2:  Unremarkable. L2-L3: Facet arthropathy. Unremarkable disc space. No impingement. L3-L4: Facet arthropathy. Unremarkable disc  space. No impingement. L4-L5: 2 mm slip. Uncovering of the disc with central protrusion. Posterior element hypertrophy. Mild stenosis. Mild subarticular zone narrowing could affect either L5 nerve root. No compressive foraminal narrowing. L5-S1: Disc space narrowing. Central protrusion. Facet arthropathy. Mild stenosis with borderline S1 neural impingement. BILATERAL foraminal narrowing is observed, worse on the LEFT, affecting primarily the LEFT L5 nerve root. IMPRESSION: MR THORACIC SPINE IMPRESSION Acute T10 compression fracture. Minimal retropulsion. LEFT paravertebral hematoma but no epidural hematoma or significant stenosis. Chronic T12 compression fracture, without significant stenosis or cord compression. MR LUMBAR SPINE IMPRESSION No acute lumbar spine fracture, but acute fractures of the S2 and S3 sacral segments are observed. Sacral canal stenosis is likely due to slight retropulsion/displacement of the S3 segment. Degenerative disc disease at L4-5 and  L5-S1, related to 2 mm slip at L4-5, protruding disc material, and posterior element hypertrophy. Subarticular zone narrowing at L4-5 as well as foraminal narrowing at L5-S1 potentially affects the L5 nerve roots. See discussion above. Electronically Signed   By: Staci Righter M.D.   On: 12/22/2018 16:24   Mr Lumbar Spine Wo Contrast  Result Date: 12/22/2018 CLINICAL DATA:  [facility] 12/05/2018.  Continued back pain. EXAM: MRI THORACIC AND LUMBAR SPINE WITHOUT CONTRAST TECHNIQUE: Multiplanar and multiecho pulse sequences of the thoracic and lumbar spine were obtained without intravenous contrast. COMPARISON:  Plain films 12/08/2018 FINDINGS: MRI THORACIC SPINE FINDINGS Alignment: Slight increased kyphosis due to chronic deformity T12. No subluxation. Vertebrae: Acute compression fracture T10, superior endplate depression, minimal retropulsion posterosuperior aspect. No significant wedging. Chronic compression fracture T12, loss of approximately 1/3 vertebral  body height, chronically retropulsed fragment into the canal roughly 4 mm. Cord: No cord compression or abnormal cord signal. No epidural hematoma. Paraspinal and other soft tissues: There is a paravertebral hematoma on the LEFT at T10, medial to the aorta. Disc levels: No significant thoracic disc protrusion is evident. There are no areas of compressive spinal stenosis. MRI LUMBAR SPINE FINDINGS Segmentation:  Standard. Alignment:  Anatomic except for 2 mm facet mediated slip L4-5. Vertebrae: No lumbar compression fracture. Fractures of the S2 and S3 sacral segments are identified however. There appears to be sacral canal stenosis based on mildly retropulsed S3. Extension of fracture into the sacral alae is difficult to assess. Conus medullaris and cauda equina: Conus extends to the L1 level. Conus and cauda equina appear normal. No definite epidural hematoma in the sacral canal Paraspinal and other soft tissues: Renal cystic disease, incompletely evaluated. Disc levels: L1-L2:  Unremarkable. L2-L3: Facet arthropathy. Unremarkable disc space. No impingement. L3-L4: Facet arthropathy. Unremarkable disc space. No impingement. L4-L5: 2 mm slip. Uncovering of the disc with central protrusion. Posterior element hypertrophy. Mild stenosis. Mild subarticular zone narrowing could affect either L5 nerve root. No compressive foraminal narrowing. L5-S1: Disc space narrowing. Central protrusion. Facet arthropathy. Mild stenosis with borderline S1 neural impingement. BILATERAL foraminal narrowing is observed, worse on the LEFT, affecting primarily the LEFT L5 nerve root. IMPRESSION: MR THORACIC SPINE IMPRESSION Acute T10 compression fracture. Minimal retropulsion. LEFT paravertebral hematoma but no epidural hematoma or significant stenosis. Chronic T12 compression fracture, without significant stenosis or cord compression. MR LUMBAR SPINE IMPRESSION No acute lumbar spine fracture, but acute fractures of the S2 and S3 sacral  segments are observed. Sacral canal stenosis is likely due to slight retropulsion/displacement of the S3 segment. Degenerative disc disease at L4-5 and L5-S1, related to 2 mm slip at L4-5, protruding disc material, and posterior element hypertrophy. Subarticular zone narrowing at L4-5 as well as foraminal narrowing at L5-S1 potentially affects the L5 nerve roots. See discussion above. Electronically Signed   By: Staci Righter M.D.   On: 12/22/2018 16:24    Pending Labs Unresulted Labs (From admission, onward)    Start     Ordered   12/29/18 0500  Creatinine, serum  (enoxaparin (LOVENOX)    CrCl >/= 30 ml/min)  Weekly,   R    Comments:  while on enoxaparin therapy    12/22/18 1903          Vitals/Pain Today's Vitals   12/22/18 1330 12/22/18 1345 12/22/18 1620 12/22/18 1625  BP: (!) 170/84 (!) 177/83 (!) 170/91 (!) 170/91  Pulse: 67 68 64 64  Resp:      Temp:  TempSrc:      SpO2: 94% 96% 96% 97%  Weight:      Height:      PainSc:        Isolation Precautions No active isolations  Medications Medications  methylPREDNISolone (MEDROL DOSEPAK) tablet 8 mg (has no administration in time range)  methylPREDNISolone (MEDROL DOSEPAK) tablet 4 mg (has no administration in time range)  methylPREDNISolone (MEDROL DOSEPAK) tablet 4 mg (has no administration in time range)  methylPREDNISolone (MEDROL DOSEPAK) tablet 8 mg (has no administration in time range)  methylPREDNISolone (MEDROL DOSEPAK) tablet 4 mg (has no administration in time range)  methylPREDNISolone (MEDROL DOSEPAK) tablet 8 mg (has no administration in time range)  methylPREDNISolone (MEDROL DOSEPAK) tablet 4 mg (has no administration in time range)  enoxaparin (LOVENOX) injection 40 mg (has no administration in time range)  ondansetron (ZOFRAN) tablet 4 mg (has no administration in time range)    Or  ondansetron (ZOFRAN) injection 4 mg (has no administration in time range)  acetaminophen (TYLENOL) tablet 650 mg (has  no administration in time range)    Or  acetaminophen (TYLENOL) suppository 650 mg (has no administration in time range)  ketorolac (TORADOL) 15 MG/ML injection 15 mg (has no administration in time range)  morphine 2 MG/ML injection 2 mg (has no administration in time range)  morphine 2 MG/ML injection 2 mg (2 mg Intravenous Given 12/22/18 1409)  LORazepam (ATIVAN) injection 1 mg (1 mg Intravenous Given 12/22/18 1430)  morphine 2 MG/ML injection 2 mg (2 mg Intravenous Given 12/22/18 1826)    Mobility non-ambulatory Low fall risk   Focused Assessments muskuloskeletal   R Recommendations: See Admitting Provider Note  Report given to:   Additional Notes:  On purewick

## 2018-12-22 NOTE — ED Provider Notes (Signed)
[facility] EMERGENCY DEPARTMENT Provider Note   CSN: [id number] Arrival date & time: 12/22/18  1302    History   Chief Complaint Chief Complaint  Patient presents with   Fall    HPI Patricia [name] is a 85 y.o. female.     Patient is an 85 year old female with past medical history of occipital stroke, recurrent falls who presents the emergency department for low back pain after fall.  She reports that 2 weeks ago she was going to the bathroom at a restaurant when she had a mechanical fall backwards.  Reports that she landed on her tailbone and then hit her head.  She was evaluated by primary care doctor about a week later.  Reports that a an x-ray of her tailbone and lumbar spine did not reveal any obvious fracture but that she had a lot of arthritis in her spine and it was hard to read per the patient's daughter's report.  The patient's daughter reports that the patient symptoms are worsening.  Reports that her pain is getting worse to the point where she was unable to get out of bed today.  Reports that her left leg feels weak and painful.  Reports that she has some numbness going into her left leg.  No fever, no loss of control of bowel or bladder, no urinary retention.     Past Medical History:  Diagnosis Date   Hypothyroid    Stroke [facility])     Patient Active Problem List   Diagnosis Date Noted   Recurrent falls 04/27/2018   Small fiber neuropathy 09/03/2016   Compression fracture of thoracic vertebra ([place]) 06/21/2016   MGUS (monoclonal gammopathy of unknown significance) 02/16/2016   Quality of life palliative care encounter 02/16/2016   Elevated blood pressure 02/16/2016   Polyneuropathy 01/15/2016   Neuropathy 01/10/2016   Occipital stroke ([place]) 02/21/2015    Past Surgical History:  Procedure Laterality Date   CATARACT EXTRACTION Bilateral    EYE SURGERY Left 02/15/15    Laser surgery      OB History   No obstetric history on  file.      Home Medications    Prior to Admission medications   Medication Sig Start Date End Date Taking? Authorizing Provider  aspirin 81 MG chewable tablet Chew 81 mg by mouth daily.      atorvastatin (LIPITOR) 10 MG tablet TAKE 1 TABLET BY MOUTH ONCE DAILY 08/18/18   Melvenia Beam, MD  Biotin 5000 MCG TABS Take 1 tablet by mouth daily.    [provider]  CALCIUM PO Take 1,000 mg by mouth daily.    [provider]  cholecalciferol (VITAMIN D) 1000 units tablet Take 2,000 Units by mouth daily.    [provider]  cyclobenzaprine (FLEXERIL) 10 MG tablet Take 10 mg by mouth at bedtime.    [provider]  denosumab (PROLIA) 60 MG/ML SOSY injection Inject 60 mg into the skin every 6 (six) months.    [provider]  Flaxseed, Linseed, (FLAX SEED OIL PO) Take 1,000 mg by mouth daily.    [provider]  GLUCOSAMINE-CHONDROITIN PO Take 1,500 mg by mouth daily.    [provider]  levothyroxine (SYNTHROID, LEVOTHROID) 88 MCG tablet Take 88 mcg by mouth daily before breakfast.    [provider]  MELATONIN PO Take 5 mg by mouth at bedtime as needed.     [provider]  mirabegron ER (MYRBETRIQ) 50 MG TB24 tablet  Take 50 mg by mouth daily.    [provider]  Multiple Vitamin (MULTIVITAMIN) tablet Take 1 tablet by mouth daily.    [provider]  Omega-3 Fatty Acids (FISH OIL PO) Take 1,200 mg by mouth daily.    [provider]  polyethylene glycol (MIRALAX / GLYCOLAX) packet Take 17 g by mouth daily.    [provider]  Probiotic Product (PROBIOTIC DAILY PO) Take 1 tablet by mouth daily.    [provider]  sertraline (ZOLOFT) 50 MG tablet Take 25 mg by mouth daily.    [provider]  TURMERIC PO Take 750 mg by mouth daily.    [provider]  vitamin B-12 (CYANOCOBALAMIN) 1000 MCG tablet Take 1,000 mcg by mouth daily.    [provider]    vitamin C (ASCORBIC ACID) 500 MG tablet Take 500 mg by mouth daily.    [provider]    Family History Family History  Problem Relation Age of Onset   Heart disease Mother    Throat cancer Maternal Aunt    Cancer Maternal Aunt        breast ca   Lupus Daughter    Anuerysm Daughter        Brain    Social History Social History   Tobacco Use   Smoking status: Never Smoker   Smokeless tobacco: Never Used  Substance Use Topics   Alcohol use: Yes    Alcohol/week: 0.0 standard drinks    Comment: Very rarley    Drug use: No     Allergies   Patient has no known allergies.   Review of Systems Review of Systems  Constitutional: Negative for chills and fever.  HENT: Negative for ear pain and sore throat.   Eyes: Negative for pain and visual disturbance.  Respiratory: Negative for cough and shortness of breath.   Cardiovascular: Negative for chest pain and palpitations.  Gastrointestinal: Negative for abdominal pain and vomiting.  Genitourinary: Negative for dysuria and hematuria.  Musculoskeletal: Positive for arthralgias, back pain and gait problem. Negative for joint swelling, myalgias, neck pain and neck stiffness.  Skin: Negative for color change and rash.  Neurological: Negative for dizziness, seizures, syncope, weakness, light-headedness and headaches.  All other systems reviewed and are negative.    Physical Exam Updated Vital Signs BP (!) 198/90    Pulse 79    Temp 98.9 F (37.2 C) (Oral)    Resp 20    Ht 5\' 6"  (1.676 m)    Wt 78.5 kg    SpO2 94%    BMI 27.92 kg/m   Physical Exam Vitals signs and nursing note reviewed.  Constitutional:      General: She is not in acute distress.    Appearance: She is well-developed.  HENT:     Head: Normocephalic and atraumatic.  Eyes:     Conjunctiva/sclera: Conjunctivae normal.  Neck:     Musculoskeletal: Neck supple.  Cardiovascular:     Rate and Rhythm: Normal rate and regular rhythm.      Heart sounds: No murmur.  Pulmonary:     Effort: Pulmonary effort is normal. No respiratory distress.     Breath sounds: Normal breath sounds.  Abdominal:     Palpations: Abdomen is soft.     Tenderness: There is no abdominal tenderness.  Musculoskeletal:        General: Tenderness present. No swelling or deformity.     Right lower leg: No edema.  Left lower leg: No edema.     Comments: Point ttp L4L5 and lower thoracic vertebra  Skin:    General: Skin is warm and dry.     Capillary Refill: Capillary refill takes less than 2 seconds.  Neurological:     Mental Status: She is alert.     Comments: Decreased sensation in the distal LLE with normal pulses. Decreased strength secondary to pain in the LLE.   Psychiatric:        Mood and Affect: Mood normal.      ED Treatments / Results  Labs (all labs ordered are listed, but only abnormal results are displayed) Labs Reviewed  BASIC METABOLIC PANEL  CBC WITH DIFFERENTIAL/PLATELET  URINALYSIS, ROUTINE W REFLEX MICROSCOPIC    EKG None  Radiology No results found.  Procedures Procedures (including critical care time)  Medications Ordered in ED Medications  morphine 2 MG/ML injection 2 mg (2 mg Intravenous Given 12/22/18 1409)  LORazepam (ATIVAN) injection 1 mg (1 mg Intravenous Given 12/22/18 1430)     Initial Impression / Assessment and Plan / ED Course  I have reviewed the triage vital signs and the nursing notes.  Pertinent labs & imaging results that were available during my care of the patient were reviewed by me and considered in my medical decision making (see chart for details).        Patient car passed to next provider due to change of shift  Final Clinical Impressions(s) / ED Diagnoses   Final diagnoses:  Fall, initial encounter  Acute bilateral low back pain with left-sided sciatica    ED Discharge Orders    None       Kristine Royal 12/22/18 1543    Maudie Flakes, MD 12/23/18  1530

## 2018-12-22 NOTE — ED Provider Notes (Signed)
Care assumed from [facility], PA-C at shift change.  See her note for full details regarding H&P.  Briefly, patient had fall 2 weeks ago onto her tailbone.  She has had progressively worsening back pain and is now having numbness/tingling to the left lower extremity.  She did not have any cauda equina symptoms.  On exam she did have left lower extremity weakness and sensory changes.  Currently pending MRI thoracic and lumbar spine.  Anticipate admission for pain control.   Physical Exam  BP (!) 170/91 (BP Location: Left Arm)   Pulse 64   Temp 98.9 F (37.2 C) (Oral)   Resp 20   Ht 5\' 6"  (1.676 m)   Wt 78.5 kg   SpO2 97%   BMI 27.92 kg/m   Physical Exam Vitals signs and nursing note reviewed.  Constitutional:      General: She is not in acute distress.    Appearance: She is well-developed.     Comments: Lying in bed in no distress  HENT:     Head: Normocephalic and atraumatic.  Eyes:     Conjunctiva/sclera: Conjunctivae normal.  Neck:     Musculoskeletal: Neck supple.  Cardiovascular:     Rate and Rhythm: Normal rate.  Pulmonary:     Effort: Pulmonary effort is normal.  Musculoskeletal: Normal range of motion.  Skin:    General: Skin is warm.  Neurological:     Mental Status: She is alert.  Psychiatric:        Mood and Affect: Mood normal.     ED Course/Procedures     Procedures Results for orders placed or performed during the hospital encounter of 51/70/01  Basic metabolic panel  Result Value Ref Range   Sodium 139 135 - 145 mmol/L   Potassium 4.0 3.5 - 5.1 mmol/L   Chloride 106 98 - 111 mmol/L   CO2 26 22 - 32 mmol/L   Glucose, Bld 135 (H) 70 - 99 mg/dL   BUN 10 8 - 23 mg/dL   Creatinine, Ser 0.72 0.44 - 1.00 mg/dL   Calcium 8.9 8.9 - 10.3 mg/dL   GFR calc non Af Amer >60 >60 mL/min   GFR calc Af Amer >60 >60 mL/min   Anion gap 7 5 - 15  CBC with Differential  Result Value Ref Range   WBC 8.4 4.0 - 10.5 K/uL   RBC 4.29 3.87 - 5.11 MIL/uL    Hemoglobin 12.7 12.0 - 15.0 g/dL   HCT 40.7 36.0 - 46.0 %   MCV 94.9 80.0 - 100.0 fL   MCH 29.6 26.0 - 34.0 pg   MCHC 31.2 30.0 - 36.0 g/dL   RDW 13.3 11.5 - 15.5 %   Platelets 239 150 - 400 K/uL   nRBC 0.0 0.0 - 0.2 %   Neutrophils Relative % 84 %   Neutro Abs 7.1 1.7 - 7.7 K/uL   Lymphocytes Relative 9 %   Lymphs Abs 0.8 0.7 - 4.0 K/uL   Monocytes Relative 4 %   Monocytes Absolute 0.3 0.1 - 1.0 K/uL   Eosinophils Relative 0 %   Eosinophils Absolute 0.0 0.0 - 0.5 K/uL   Basophils Relative 1 %   Basophils Absolute 0.0 0.0 - 0.1 K/uL   Immature Granulocytes 2 %   Abs Immature Granulocytes 0.16 (H) 0.00 - 0.07 K/uL  Urinalysis, Routine w reflex microscopic  Result Value Ref Range   Color, Urine STRAW (A) YELLOW   APPearance CLEAR CLEAR   Specific Gravity,  Urine 1.005 1.005 - 1.030   pH 8.0 5.0 - 8.0   Glucose, UA NEGATIVE NEGATIVE mg/dL   Hgb urine dipstick NEGATIVE NEGATIVE   Bilirubin Urine NEGATIVE NEGATIVE   Ketones, ur NEGATIVE NEGATIVE mg/dL   Protein, ur NEGATIVE NEGATIVE mg/dL   Nitrite NEGATIVE NEGATIVE   Leukocytes,Ua NEGATIVE NEGATIVE   Mr Thoracic Spine Wo Contrast  Result Date: 12/22/2018 CLINICAL DATA:  [place] 12/05/2018.  Continued back pain. EXAM: MRI THORACIC AND LUMBAR SPINE WITHOUT CONTRAST TECHNIQUE: Multiplanar and multiecho pulse sequences of the thoracic and lumbar spine were obtained without intravenous contrast. COMPARISON:  Plain films 12/08/2018 FINDINGS: MRI THORACIC SPINE FINDINGS Alignment: Slight increased kyphosis due to chronic deformity T12. No subluxation. Vertebrae: Acute compression fracture T10, superior endplate depression, minimal retropulsion posterosuperior aspect. No significant wedging. Chronic compression fracture T12, loss of approximately 1/3 vertebral body height, chronically retropulsed fragment into the canal roughly 4 mm. Cord: No cord compression or abnormal cord signal. No epidural hematoma. Paraspinal and other soft tissues: There  is a paravertebral hematoma on the LEFT at T10, medial to the aorta. Disc levels: No significant thoracic disc protrusion is evident. There are no areas of compressive spinal stenosis. MRI LUMBAR SPINE FINDINGS Segmentation:  Standard. Alignment:  Anatomic except for 2 mm facet mediated slip L4-5. Vertebrae: No lumbar compression fracture. Fractures of the S2 and S3 sacral segments are identified however. There appears to be sacral canal stenosis based on mildly retropulsed S3. Extension of fracture into the sacral alae is difficult to assess. Conus medullaris and cauda equina: Conus extends to the L1 level. Conus and cauda equina appear normal. No definite epidural hematoma in the sacral canal Paraspinal and other soft tissues: Renal cystic disease, incompletely evaluated. Disc levels: L1-L2:  Unremarkable. L2-L3: Facet arthropathy. Unremarkable disc space. No impingement. L3-L4: Facet arthropathy. Unremarkable disc space. No impingement. L4-L5: 2 mm slip. Uncovering of the disc with central protrusion. Posterior element hypertrophy. Mild stenosis. Mild subarticular zone narrowing could affect either L5 nerve root. No compressive foraminal narrowing. L5-S1: Disc space narrowing. Central protrusion. Facet arthropathy. Mild stenosis with borderline S1 neural impingement. BILATERAL foraminal narrowing is observed, worse on the LEFT, affecting primarily the LEFT L5 nerve root. IMPRESSION: MR THORACIC SPINE IMPRESSION Acute T10 compression fracture. Minimal retropulsion. LEFT paravertebral hematoma but no epidural hematoma or significant stenosis. Chronic T12 compression fracture, without significant stenosis or cord compression. MR LUMBAR SPINE IMPRESSION No acute lumbar spine fracture, but acute fractures of the S2 and S3 sacral segments are observed. Sacral canal stenosis is likely due to slight retropulsion/displacement of the S3 segment. Degenerative disc disease at L4-5 and L5-S1, related to 2 mm slip at L4-5,  protruding disc material, and posterior element hypertrophy. Subarticular zone narrowing at L4-5 as well as foraminal narrowing at L5-S1 potentially affects the L5 nerve roots. See discussion above. Electronically Signed   By: Staci Righter M.D.   On: 12/22/2018 16:24   Mr Lumbar Spine Wo Contrast  Result Date: 12/22/2018 CLINICAL DATA:  [place] 12/05/2018.  Continued back pain. EXAM: MRI THORACIC AND LUMBAR SPINE WITHOUT CONTRAST TECHNIQUE: Multiplanar and multiecho pulse sequences of the thoracic and lumbar spine were obtained without intravenous contrast. COMPARISON:  Plain films 12/08/2018 FINDINGS: MRI THORACIC SPINE FINDINGS Alignment: Slight increased kyphosis due to chronic deformity T12. No subluxation. Vertebrae: Acute compression fracture T10, superior endplate depression, minimal retropulsion posterosuperior aspect. No significant wedging. Chronic compression fracture T12, loss of approximately 1/3 vertebral body height, chronically retropulsed fragment into the canal roughly  4 mm. Cord: No cord compression or abnormal cord signal. No epidural hematoma. Paraspinal and other soft tissues: There is a paravertebral hematoma on the LEFT at T10, medial to the aorta. Disc levels: No significant thoracic disc protrusion is evident. There are no areas of compressive spinal stenosis. MRI LUMBAR SPINE FINDINGS Segmentation:  Standard. Alignment:  Anatomic except for 2 mm facet mediated slip L4-5. Vertebrae: No lumbar compression fracture. Fractures of the S2 and S3 sacral segments are identified however. There appears to be sacral canal stenosis based on mildly retropulsed S3. Extension of fracture into the sacral alae is difficult to assess. Conus medullaris and cauda equina: Conus extends to the L1 level. Conus and cauda equina appear normal. No definite epidural hematoma in the sacral canal Paraspinal and other soft tissues: Renal cystic disease, incompletely evaluated. Disc levels: L1-L2:  Unremarkable.  L2-L3: Facet arthropathy. Unremarkable disc space. No impingement. L3-L4: Facet arthropathy. Unremarkable disc space. No impingement. L4-L5: 2 mm slip. Uncovering of the disc with central protrusion. Posterior element hypertrophy. Mild stenosis. Mild subarticular zone narrowing could affect either L5 nerve root. No compressive foraminal narrowing. L5-S1: Disc space narrowing. Central protrusion. Facet arthropathy. Mild stenosis with borderline S1 neural impingement. BILATERAL foraminal narrowing is observed, worse on the LEFT, affecting primarily the LEFT L5 nerve root. IMPRESSION: MR THORACIC SPINE IMPRESSION Acute T10 compression fracture. Minimal retropulsion. LEFT paravertebral hematoma but no epidural hematoma or significant stenosis. Chronic T12 compression fracture, without significant stenosis or cord compression. MR LUMBAR SPINE IMPRESSION No acute lumbar spine fracture, but acute fractures of the S2 and S3 sacral segments are observed. Sacral canal stenosis is likely due to slight retropulsion/displacement of the S3 segment. Degenerative disc disease at L4-5 and L5-S1, related to 2 mm slip at L4-5, protruding disc material, and posterior element hypertrophy. Subarticular zone narrowing at L4-5 as well as foraminal narrowing at L5-S1 potentially affects the L5 nerve roots. See discussion above. Electronically Signed   By: Staci Righter M.D.   On: 12/22/2018 16:24      MDM  Pt presenting for persistent back pain after a fall 2 weeks ago. Now having LLE paresthesias/numbness/weakness. Difficulty completing ADLs at home due to pain.   Labs reassuring. UA without evidence of UTI.   MRI thoracic spine with Acute T10 compression fracture. Minimal retropulsion. LEFT paravertebral hematoma but no epidural hematoma or significant stenosis. Chronic T12 compression fracture, without significant stenosis or cord compression. MR LUMBAR SPINE IMPRESSION No acute lumbar spine fracture, but acute fractures of the  S2 and S3 sacral segments are observed. Sacral canal stenosis is likely due to slight retropulsion/displacement of the S3 segment. Degenerative disc disease at L4-5 and L5-S1, related to 2 mm slip at L4-5, protruding disc material, and posterior element hypertrophy. Subarticular zone narrowing at L4-5 as well as foraminal narrowing at L5-S1 potentially affects the L5 nerve roots.   MRI lumbar spine, No acute lumbar spine fracture, but acute fractures of the S2 and S3 sacral segments are observed. Sacral canal stenosis is likely due to slight retropulsion/displacement of the S3 segment. Degenerative disc disease at L4-5 and L5-S1, related to 2 mm slip at L4-5, protruding disc material, and posterior element hypertrophy. Subarticular zone narrowing at L4-5 as well as foraminal narrowing at L5-S1 potentially affects the L5 nerve roots.   5:39 PM CONSULT with neurosurgery, Dr. Zada Finders, who recommended starting a medrol dose pack for symptoms. He recommended to f/u in office in the next 1-2 weeks.   5:59 PM CONSULT Dr.  Dean, orthopedics, about patient's MRI. He states that sacral fractures will take about 6 weeks to heal. Recommends pt be partially weight bearing with a walker. She should f/u in a few weeks as an outpatient in the office.   6:42 PM CONSULT with Dr. Olevia Bowens who accepts the patient for admission.    Rodney Booze, [place] 12/22/18 1911    Pattricia Boss, MD 12/26/18 [phone]

## 2018-12-22 NOTE — ED Triage Notes (Signed)
Fell on 2/29, has been able to ambulate normally since, but last night tailbone began to hurt, this am so bad she could not get out of bed. Was assisted to bed by daughter, called PCP and told to come here. Vitals WDL

## 2018-12-22 NOTE — H&P (Signed)
History and Physical    Patricia [name] QBH:[id number] DOB: 1929-10-15 DOA: 12/22/2018  PCP: Deland Pretty, MD   Patient coming from: Home.  I have personally briefly reviewed patient's old medical records in [place]  Chief Complaint: Fall.  HPI: Patricia [name] is a 85 y.o. female with medical history significant of hypothyroidism, CVA who is coming to the emergency department due to progressively worse back pain and difficulty walking after having a fall on 12/05/2018.  The patient states that she fell on her tailbone.  She saw her doctor last week and was given a Medrol pack on Friday.  However, she has to continue having pain and this morning the pain was so intense that she was unable to get out of bed.  She subsequently had to have her daughter, and help her get out of bed.  She called her primary care doctor who asked her to come to the emergency department.  She denies fever, chills, sore throat, rhinorrhea, dyspnea, chest pain, palpitations, pitting edema lower extremities, abdominal pain, diarrhea, constipation, melena or hematochezia.  No dysuria, hematuria or oliguria.  She gets frequency and large incontinence at times.  She denies polyuria, polydipsia, polyphagia or blurred vision.  ED Course: Initial vital signs temperature 98.9 F, pulse 79, respirations 20, blood pressure 198/90 mmHg and O2 sat 94% on room air. The patient was given morphine 2 mg IVP x2 and lorazepam 1 mg IVP prior to MRI.  Urinalysis shows stroke color, but was otherwise unremarkable.  CBC was normal.  Her BMP shows a glucose of 135 mg/dL, but is otherwise normal.  Imaging with MRI showed an acute T10 compression fracture.  There is a left paravertebral hematoma, but no epidural hematoma or significant stenosis.  There is a chronic T12 compression fracture.  230 on 229 that are no lumbar spine fractures, but acute fracture is 2 nights 3 on the sacral segments are observed.  There is sacral cannula stenosis is  likely due to a slight retro-compulsion/displacement of the S3 segment.  Review of Systems: As per HPI otherwise 10 point review of systems negative.   Past Medical History:  Diagnosis Date   Hypothyroid    Stroke [facility])     Past Surgical History:  Procedure Laterality Date   CATARACT EXTRACTION Bilateral    EYE SURGERY Left 02/15/15    Laser surgery      reports that she has never smoked. She has never used smokeless tobacco. She reports current alcohol use. She reports that she does not use drugs.  Allergies  Allergen Reactions   Fosamax [Alendronate Sodium] Other (See Comments)    Upset stomach    Family History  Problem Relation Age of Onset   Heart disease Mother    Throat cancer Maternal Aunt    Cancer Maternal Aunt        breast ca   Lupus Daughter    Anuerysm Daughter        Brain   Prior to Admission medications   Medication Sig Start Date End Date Taking? Authorizing Provider  aspirin 81 MG chewable tablet Chew 81 mg by mouth daily.   Yes   atorvastatin (LIPITOR) 10 MG tablet TAKE 1 TABLET BY MOUTH ONCE DAILY Patient taking differently: Take 10 mg by mouth daily.  08/18/18  Yes Melvenia Beam, MD  Biotin 5000 MCG TABS Take 1 tablet by mouth daily.   Yes [provider]  CALCIUM PO Take 1,000 mg by mouth 2 (  two) times daily.    Yes [provider]  cholecalciferol (VITAMIN D) 1000 units tablet Take 1,000 Units by mouth 2 (two) times daily.    Yes [provider]  denosumab (PROLIA) 60 MG/ML SOSY injection Inject 60 mg into the skin every 6 (six) months.   Yes [provider]  diclofenac sodium (VOLTAREN) 1 % GEL Apply 1 application topically 4 (four) times daily. 12/18/18  Yes [provider]  Flaxseed, Linseed, (FLAX SEED OIL PO) Take 1,000 mg by mouth daily.   Yes [provider]  GLUCOSAMINE-CHONDROITIN PO Take 1,500 mg by mouth daily.   Yes [provider]  levothyroxine (SYNTHROID,  LEVOTHROID) 88 MCG tablet Take 88 mcg by mouth daily before breakfast.   Yes [provider]  MELATONIN PO Take 10 mg by mouth at bedtime as needed.    Yes [provider]  Multiple Vitamin (MULTIVITAMIN) tablet Take 1 tablet by mouth daily.   Yes [provider]  Omega-3 Fatty Acids (FISH OIL PO) Take 1,200 mg by mouth daily.   Yes [provider]  polyethylene glycol (MIRALAX / GLYCOLAX) packet Take 17 g by mouth daily as needed for mild constipation.    Yes [provider]  predniSONE (DELTASONE) 5 MG tablet Take 5 mg by mouth See admin instructions. Take 6 tablets daily for 2 days then reduce by one tablet every 2 days until gone for 12 days 12/18/18  Yes [provider]  Probiotic Product (PROBIOTIC DAILY PO) Take 1 tablet by mouth daily.   Yes [provider]  sertraline (ZOLOFT) 50 MG tablet Take 50 mg by mouth daily.    Yes [provider]  tolterodine (DETROL LA) 4 MG 24 hr capsule Take 4 mg by mouth daily. 12/13/18  Yes [provider]  TURMERIC PO Take 750 mg by mouth daily.   Yes [provider]  vitamin B-12 (CYANOCOBALAMIN) 1000 MCG tablet Take 1,000 mcg by mouth daily.   Yes [provider]  vitamin C (ASCORBIC ACID) 500 MG tablet Take 500 mg by mouth daily.   Yes [provider]    Physical Exam: Vitals:   12/22/18 1330 12/22/18 1345 12/22/18 1620 12/22/18 1625  BP: (!) 170/84 (!) 177/83 (!) 170/91 (!) 170/91  Pulse: 67 68 64 64  Resp:      Temp:      TempSrc:      SpO2: 94% 96% 96% 97%  Weight:      Height:        Constitutional: NAD, calm, comfortable Eyes: PERRL, lids and conjunctivae normal ENMT: Mucous membranes are moist. Posterior pharynx clear of any exudate or lesions. Neck: normal, supple, no masses, no thyromegaly Respiratory: clear to auscultation bilaterally, no wheezing, no crackles. Normal respiratory effort. No accessory muscle use.  Cardiovascular:  Regular rate and rhythm, no murmurs / rubs / gallops. No extremity edema. 2+ pedal pulses. No carotid bruits.  Abdomen: Soft, no tenderness, no masses palpated. No hepatosplenomegaly. Bowel sounds positive.  Musculoskeletal: no clubbing / cyanosis. Good ROM, no contractures. Normal muscle tone.  Skin: no rashes, lesions, ulcers on very limited dermatological examination. Neurologic: CN 2-12 grossly intact.  Subjective decrease in sensation in RLE, DTR normal. Strength 4/5 in lower extremities. Psychiatric: Normal judgment and insight. Alert and oriented x 3. Normal mood.   Labs on Admission: I have personally reviewed following labs and imaging studies  CBC: Recent Labs  Lab 12/22/18 1620  WBC 8.4  NEUTROABS 7.1  HGB 12.7  HCT 40.7  MCV 94.9  PLT 308   Basic Metabolic Panel: Recent Labs  Lab 12/22/18 1620  NA 139  K 4.0  CL 106  CO2 26  GLUCOSE 135*  BUN 10  CREATININE 0.72  CALCIUM 8.9   GFR: Estimated Creatinine Clearance: 53.4 mL/min (by C-G formula based on SCr of 0.72 mg/dL). Liver Function Tests: No results for input(s): AST, ALT, ALKPHOS, BILITOT, PROT, ALBUMIN in the last 168 hours. No results for input(s): LIPASE, AMYLASE in the last 168 hours. No results for input(s): AMMONIA in the last 168 hours. Coagulation Profile: No results for input(s): INR, PROTIME in the last 168 hours. Cardiac Enzymes: No results for input(s): CKTOTAL, CKMB, CKMBINDEX, TROPONINI in the last 168 hours. BNP (last 3 results) No results for input(s): PROBNP in the last 8760 hours. HbA1C: No results for input(s): HGBA1C in the last 72 hours. CBG: No results for input(s): GLUCAP in the last 168 hours. Lipid Profile: No results for input(s): CHOL, HDL, LDLCALC, TRIG, CHOLHDL, LDLDIRECT in the last 72 hours. Thyroid Function Tests: No results for input(s): TSH, T4TOTAL, FREET4, T3FREE, THYROIDAB in the last 72 hours. Anemia Panel: No results for input(s): VITAMINB12, FOLATE, FERRITIN,  TIBC, IRON, RETICCTPCT in the last 72 hours. Urine analysis:    Component Value Date/Time   COLORURINE STRAW (A) 12/22/2018 1541   APPEARANCEUR CLEAR 12/22/2018 1541   LABSPEC 1.005 12/22/2018 1541   PHURINE 8.0 12/22/2018 1541   GLUCOSEU NEGATIVE 12/22/2018 1541   HGBUR NEGATIVE 12/22/2018 1541   BILIRUBINUR NEGATIVE 12/22/2018 1541   KETONESUR NEGATIVE 12/22/2018 1541   PROTEINUR NEGATIVE 12/22/2018 1541   UROBILINOGEN 1.0 03/19/2009 1456   NITRITE NEGATIVE 12/22/2018 1541   LEUKOCYTESUR NEGATIVE 12/22/2018 1541    Radiological Exams on Admission: Mr Thoracic Spine Wo Contrast  Result Date: 12/22/2018 CLINICAL DATA:  [place] 12/05/2018.  Continued back pain. EXAM: MRI THORACIC AND LUMBAR SPINE WITHOUT CONTRAST TECHNIQUE: Multiplanar and multiecho pulse sequences of the thoracic and lumbar spine were obtained without intravenous contrast. COMPARISON:  Plain films 12/08/2018 FINDINGS: MRI THORACIC SPINE FINDINGS Alignment: Slight increased kyphosis due to chronic deformity T12. No subluxation. Vertebrae: Acute compression fracture T10, superior endplate depression, minimal retropulsion posterosuperior aspect. No significant wedging. Chronic compression fracture T12, loss of approximately 1/3 vertebral body height, chronically retropulsed fragment into the canal roughly 4 mm. Cord: No cord compression or abnormal cord signal. No epidural hematoma. Paraspinal and other soft tissues: There is a paravertebral hematoma on the LEFT at T10, medial to the aorta. Disc levels: No significant thoracic disc protrusion is evident. There are no areas of compressive spinal stenosis. MRI LUMBAR SPINE FINDINGS Segmentation:  Standard. Alignment:  Anatomic except for 2 mm facet mediated slip L4-5. Vertebrae: No lumbar compression fracture. Fractures of the S2 and S3 sacral segments are identified however. There appears to be sacral canal stenosis based on mildly retropulsed S3. Extension of fracture into the sacral  alae is difficult to assess. Conus medullaris and cauda equina: Conus extends to the L1 level. Conus and cauda equina appear normal. No definite epidural hematoma in the sacral canal Paraspinal and other soft tissues: Renal cystic disease, incompletely evaluated. Disc levels: L1-L2:  Unremarkable. L2-L3: Facet arthropathy. Unremarkable disc space. No impingement. L3-L4: Facet arthropathy. Unremarkable disc space. No impingement. L4-L5: 2 mm slip. Uncovering of the disc with central protrusion. Posterior element hypertrophy. Mild stenosis. Mild subarticular zone narrowing could affect either L5 nerve root. No compressive foraminal narrowing. L5-S1: Disc space narrowing.  Central protrusion. Facet arthropathy. Mild stenosis with borderline S1 neural impingement. BILATERAL foraminal narrowing is observed, worse on the LEFT, affecting primarily the LEFT L5 nerve root. IMPRESSION: MR THORACIC SPINE IMPRESSION Acute T10 compression fracture. Minimal retropulsion. LEFT paravertebral hematoma but no epidural hematoma or significant stenosis. Chronic T12 compression fracture, without significant stenosis or cord compression. MR LUMBAR SPINE IMPRESSION No acute lumbar spine fracture, but acute fractures of the S2 and S3 sacral segments are observed. Sacral canal stenosis is likely due to slight retropulsion/displacement of the S3 segment. Degenerative disc disease at L4-5 and L5-S1, related to 2 mm slip at L4-5, protruding disc material, and posterior element hypertrophy. Subarticular zone narrowing at L4-5 as well as foraminal narrowing at L5-S1 potentially affects the L5 nerve roots. See discussion above. Electronically Signed   By: Staci Righter M.D.   On: 12/22/2018 16:24   Mr Lumbar Spine Wo Contrast  Result Date: 12/22/2018 CLINICAL DATA:  [place] 12/05/2018.  Continued back pain. EXAM: MRI THORACIC AND LUMBAR SPINE WITHOUT CONTRAST TECHNIQUE: Multiplanar and multiecho pulse sequences of the thoracic and lumbar spine  were obtained without intravenous contrast. COMPARISON:  Plain films 12/08/2018 FINDINGS: MRI THORACIC SPINE FINDINGS Alignment: Slight increased kyphosis due to chronic deformity T12. No subluxation. Vertebrae: Acute compression fracture T10, superior endplate depression, minimal retropulsion posterosuperior aspect. No significant wedging. Chronic compression fracture T12, loss of approximately 1/3 vertebral body height, chronically retropulsed fragment into the canal roughly 4 mm. Cord: No cord compression or abnormal cord signal. No epidural hematoma. Paraspinal and other soft tissues: There is a paravertebral hematoma on the LEFT at T10, medial to the aorta. Disc levels: No significant thoracic disc protrusion is evident. There are no areas of compressive spinal stenosis. MRI LUMBAR SPINE FINDINGS Segmentation:  Standard. Alignment:  Anatomic except for 2 mm facet mediated slip L4-5. Vertebrae: No lumbar compression fracture. Fractures of the S2 and S3 sacral segments are identified however. There appears to be sacral canal stenosis based on mildly retropulsed S3. Extension of fracture into the sacral alae is difficult to assess. Conus medullaris and cauda equina: Conus extends to the L1 level. Conus and cauda equina appear normal. No definite epidural hematoma in the sacral canal Paraspinal and other soft tissues: Renal cystic disease, incompletely evaluated. Disc levels: L1-L2:  Unremarkable. L2-L3: Facet arthropathy. Unremarkable disc space. No impingement. L3-L4: Facet arthropathy. Unremarkable disc space. No impingement. L4-L5: 2 mm slip. Uncovering of the disc with central protrusion. Posterior element hypertrophy. Mild stenosis. Mild subarticular zone narrowing could affect either L5 nerve root. No compressive foraminal narrowing. L5-S1: Disc space narrowing. Central protrusion. Facet arthropathy. Mild stenosis with borderline S1 neural impingement. BILATERAL foraminal narrowing is observed, worse on the  LEFT, affecting primarily the LEFT L5 nerve root. IMPRESSION: MR THORACIC SPINE IMPRESSION Acute T10 compression fracture. Minimal retropulsion. LEFT paravertebral hematoma but no epidural hematoma or significant stenosis. Chronic T12 compression fracture, without significant stenosis or cord compression. MR LUMBAR SPINE IMPRESSION No acute lumbar spine fracture, but acute fractures of the S2 and S3 sacral segments are observed. Sacral canal stenosis is likely due to slight retropulsion/displacement of the S3 segment. Degenerative disc disease at L4-5 and L5-S1, related to 2 mm slip at L4-5, protruding disc material, and posterior element hypertrophy. Subarticular zone narrowing at L4-5 as well as foraminal narrowing at L5-S1 potentially affects the L5 nerve roots. See discussion above. Electronically Signed   By: Staci Righter M.D.   On: 12/22/2018 16:24    EKG: Independently reviewed.  In ordered status.  Assessment/Plan Principal Problem:   Intractable back pain   Compression fracture of thoracic vertebra (HCC) Observation/MedSurg. Acetaminophen as needed. Oxycodone as needed. Toradol 15 mg IVP PRN x24 hours. Physical therapy evaluation in the morning.  Active Problems:   Recurrent falls Physical therapy evaluation in a.m. May need SNF or home PT.    Elevated blood pressure reading Per patient, likely due to pain. Continue pain management. Will use as needed hydralazine. Monitor blood pressure.    Hypothyroid Continue levothyroxine 88 mcg p.o. daily. Monitor TSH.    Hyperglycemia Nonfasting level earlier. Has been taking Medrol pack since Friday. Monitor glucose level.    DVT prophylaxis: Lovenox SQ. Code Status: Full code. Family Communication: Disposition Plan: Observation for pain control, PT evaluation in a.m. Consults called: PT and case management. Admission status: Observation/MedSurg.   Reubin Milan MD [facility]  12/22/2018, 7:41 PM   This  document was prepared using Dragon voice recognition software and may contain some unintended transcription errors.

## 2018-12-23 ENCOUNTER — Encounter (HOSPITAL_COMMUNITY): Payer: Self-pay | Admitting: General Practice

## 2018-12-23 DIAGNOSIS — S22070A Wedge compression fracture of T9-T10 vertebra, initial encounter for closed fracture: Principal | ICD-10-CM

## 2018-12-23 DIAGNOSIS — M549 Dorsalgia, unspecified: Secondary | ICD-10-CM | POA: Diagnosis not present

## 2018-12-23 DIAGNOSIS — E039 Hypothyroidism, unspecified: Secondary | ICD-10-CM

## 2018-12-23 MED ORDER — OXYCODONE HCL 5 MG PO TABS
5.0000 mg | ORAL_TABLET | ORAL | Status: DC | PRN
  Administered 2018-12-23 – 2018-12-24 (×3): 5 mg via ORAL
  Filled 2018-12-23 (×3): qty 1

## 2018-12-23 MED ORDER — SENNOSIDES-DOCUSATE SODIUM 8.6-50 MG PO TABS
1.0000 | ORAL_TABLET | Freq: Two times a day (BID) | ORAL | Status: DC
  Administered 2018-12-23 – 2018-12-26 (×7): 1 via ORAL
  Filled 2018-12-23 (×11): qty 1

## 2018-12-23 NOTE — Care Management Obs Status (Signed)
[place] NOTIFICATION   Patient Details  Name: Patricia [name] MRN: [id number] Date of Birth: [birth year]   Medicare Observation Status Notification Given:  Yes    Ninfa Meeker, RN 12/23/2018, 11:26 AM

## 2018-12-23 NOTE — TOC Progression Note (Signed)
Transition of Care [facility]) - Progression Note    Patient Details  Name: Patricia [name] MRN: [id number] Date of Birth: 1930/06/15  [facility]) CM/SW Contact  Loletha Grayer Beverely Pace, RN Phone Number: 12/23/2018, 12:12 PM  Clinical Narrative:   85 yr old female being observed for back pain after a fall.   Patienbt lives independently, will have family support after discharge.     Expected Discharge Plan: Home/Self Care Barriers to Discharge: No Barriers Identified  Expected Discharge Plan and Services Expected Discharge Plan: Home/Self Care Discharge Planning Services: CM Consult Post Acute Care Choice: Durable Medical Equipment Living arrangements for the past 2 months: Single Family Home                 DME Arranged: Walker rolling DME Agency: [employer] HH Arranged: NA HH Agency: NA   Social Determinants of Health (SDOH) Interventions    Readmission Risk Interventions  No flowsheet data found.
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
## 2018-12-23 NOTE — Plan of Care (Signed)

## 2018-12-23 NOTE — Progress Notes (Signed)
Physical Therapy Note   Discussed Ms. Ringenberg with Fleeta Emmer, OT;   Her second time mobilizing did not go as well as anticipated, and she must be at minguard/supervision level to be able to dc home (her daughter is on lifting restrictions);   Will plan for PT again tomorrow;   If she is slow to progress, we must consider SNF for post-acute rehab to maximize independence and safety with mobility and ADLs prior to dc home;   Roney Marion, [place] Pager [phone] Office [phone]

## 2018-12-23 NOTE — Progress Notes (Signed)
Patient ID: Patricia [name], female   DOB: 09-24-1930, 85 y.o.   MRN: [id number]  PROGRESS NOTE    Patricia [name]  KLK:[id number] DOB: [birth year] DOA: 12/22/2018 PCP: Deland Pretty, MD   Brief Narrative:  85 year old female with history of hypothyroidism, unknown type of CVA presented with progressive back pain and difficulty walking after having a fall on 12/05/2018.  She was being treated by Medrol Dosepak by her prior PCP as an outpatient.  She was found to have acute T10 compression fracture with chronic T12 compression fracture, no spinal fractures but acute fractures of S2 and S3.  ED provider spoke to neurosurgery on call who recommended Medrol Dosepak treatment and outpatient follow-up with neurosurgery.  Orthopedics was also consulted by ED provider who recommended outpatient follow-up.  Assessment & Plan:   Principal Problem:   Intractable back pain Active Problems:   Elevated blood pressure reading   Compression fracture of thoracic vertebra (HCC)   Recurrent falls   Hypothyroid   Hyperglycemia  Intractable back pain Compression fracture of thoracic and sacral vertebrae Recurrent falls -ED provider spoke to neurosurgery on call/Dr. Venetia Constable who recommended Medrol Dosepak treatment and outpatient follow-up with neurosurgery in the next 1 to 2 weeks.  Orthopedics/Dr. Marlou Sa was also consulted by ED provider who stated that it may take up to 6 weeks for the sacral fractures to heal and recommended that patient be partially weightbearing with a walker and outpatient orthopedics follow-up. -PT/OT eval -Fall precautions -Continue Medrol Dosepak.  Pain management  Hypothyroidism -Continue Synthroid  Dyslipidemia -Continue statin    DVT prophylaxis: Lovenox Code Status: Full Family Communication: None at bedside Disposition Plan: Depends on further input from PT and patient's improvement of pain.  Consultants: On-call orthopedics and neurosurgeon were spoken to by the ED  provider  Procedures: None  Antimicrobials: None   Subjective: Patient seen and examined at bedside.  Complains of lower back pain.  No overnight fever, nausea or vomiting.  No worsening lower extremity pain or bowel bladder incontinence.  Objective: Vitals:   12/22/18 2038 12/23/18 0442 12/23/18 0852 12/23/18 0856  BP: (!) 162/89 129/71 (!) 162/77 (!) 158/80  Pulse: 63 68 (!) 58   Resp: 20 16 16    Temp: 98.2 F (36.8 C) 98.3 F (36.8 C) 97.9 F (36.6 C)   TempSrc: Oral Oral Oral   SpO2: 92% 97% 97%   Weight:      Height:        Intake/Output Summary (Last 24 hours) at 12/23/2018 1226 Last data filed at 12/23/2018 0900 Gross per 24 hour  Intake 240 ml  Output --  Net 240 ml   Filed Weights   12/22/18 1304  Weight: 78.5 kg    Examination:  General exam: Appears calm and comfortable.  No distress Respiratory system: Bilateral decreased breath sounds at bases Cardiovascular system: S1 & S2 heard, mild bradycardia Gastrointestinal system: Abdomen is nondistended, soft and nontender. Normal bowel sounds heard. Extremities: No cyanosis, clubbing, edema  Central nervous system: Alert and oriented. No focal neurological deficits. Moving extremities.  Power is 3-4 out of 5 in all 4 extremities      Data Reviewed: I have personally reviewed following labs and imaging studies  CBC: Recent Labs  Lab 12/22/18 1620  WBC 8.4  NEUTROABS 7.1  HGB 12.7  HCT 40.7  MCV 94.9  PLT 979   Basic Metabolic Panel: Recent Labs  Lab 12/22/18 1620  NA 139  K 4.0  CL 106  CO2  26  GLUCOSE 135*  BUN 10  CREATININE 0.72  CALCIUM 8.9   GFR: Estimated Creatinine Clearance: 53.4 mL/min (by C-G formula based on SCr of 0.72 mg/dL). Liver Function Tests: No results for input(s): AST, ALT, ALKPHOS, BILITOT, PROT, ALBUMIN in the last 168 hours. No results for input(s): LIPASE, AMYLASE in the last 168 hours. No results for input(s): AMMONIA in the last 168 hours. Coagulation  Profile: No results for input(s): INR, PROTIME in the last 168 hours. Cardiac Enzymes: No results for input(s): CKTOTAL, CKMB, CKMBINDEX, TROPONINI in the last 168 hours. BNP (last 3 results) No results for input(s): PROBNP in the last 8760 hours. HbA1C: No results for input(s): HGBA1C in the last 72 hours. CBG: No results for input(s): GLUCAP in the last 168 hours. Lipid Profile: No results for input(s): CHOL, HDL, LDLCALC, TRIG, CHOLHDL, LDLDIRECT in the last 72 hours. Thyroid Function Tests: No results for input(s): TSH, T4TOTAL, FREET4, T3FREE, THYROIDAB in the last 72 hours. Anemia Panel: No results for input(s): VITAMINB12, FOLATE, FERRITIN, TIBC, IRON, RETICCTPCT in the last 72 hours. Sepsis Labs: No results for input(s): PROCALCITON, LATICACIDVEN in the last 168 hours.  No results found for this or any previous visit (from the past 240 hour(s)).       Radiology Studies: Mr Thoracic Spine Wo Contrast  Result Date: 12/22/2018 CLINICAL DATA:  [place] 12/05/2018.  Continued back pain. EXAM: MRI THORACIC AND LUMBAR SPINE WITHOUT CONTRAST TECHNIQUE: Multiplanar and multiecho pulse sequences of the thoracic and lumbar spine were obtained without intravenous contrast. COMPARISON:  Plain films 12/08/2018 FINDINGS: MRI THORACIC SPINE FINDINGS Alignment: Slight increased kyphosis due to chronic deformity T12. No subluxation. Vertebrae: Acute compression fracture T10, superior endplate depression, minimal retropulsion posterosuperior aspect. No significant wedging. Chronic compression fracture T12, loss of approximately 1/3 vertebral body height, chronically retropulsed fragment into the canal roughly 4 mm. Cord: No cord compression or abnormal cord signal. No epidural hematoma. Paraspinal and other soft tissues: There is a paravertebral hematoma on the LEFT at T10, medial to the aorta. Disc levels: No significant thoracic disc protrusion is evident. There are no areas of compressive spinal  stenosis. MRI LUMBAR SPINE FINDINGS Segmentation:  Standard. Alignment:  Anatomic except for 2 mm facet mediated slip L4-5. Vertebrae: No lumbar compression fracture. Fractures of the S2 and S3 sacral segments are identified however. There appears to be sacral canal stenosis based on mildly retropulsed S3. Extension of fracture into the sacral alae is difficult to assess. Conus medullaris and cauda equina: Conus extends to the L1 level. Conus and cauda equina appear normal. No definite epidural hematoma in the sacral canal Paraspinal and other soft tissues: Renal cystic disease, incompletely evaluated. Disc levels: L1-L2:  Unremarkable. L2-L3: Facet arthropathy. Unremarkable disc space. No impingement. L3-L4: Facet arthropathy. Unremarkable disc space. No impingement. L4-L5: 2 mm slip. Uncovering of the disc with central protrusion. Posterior element hypertrophy. Mild stenosis. Mild subarticular zone narrowing could affect either L5 nerve root. No compressive foraminal narrowing. L5-S1: Disc space narrowing. Central protrusion. Facet arthropathy. Mild stenosis with borderline S1 neural impingement. BILATERAL foraminal narrowing is observed, worse on the LEFT, affecting primarily the LEFT L5 nerve root. IMPRESSION: MR THORACIC SPINE IMPRESSION Acute T10 compression fracture. Minimal retropulsion. LEFT paravertebral hematoma but no epidural hematoma or significant stenosis. Chronic T12 compression fracture, without significant stenosis or cord compression. MR LUMBAR SPINE IMPRESSION No acute lumbar spine fracture, but acute fractures of the S2 and S3 sacral segments are observed. Sacral canal stenosis is likely due  to slight retropulsion/displacement of the S3 segment. Degenerative disc disease at L4-5 and L5-S1, related to 2 mm slip at L4-5, protruding disc material, and posterior element hypertrophy. Subarticular zone narrowing at L4-5 as well as foraminal narrowing at L5-S1 potentially affects the L5 nerve roots.  See discussion above. Electronically Signed   By: Staci Righter M.D.   On: 12/22/2018 16:24   Mr Lumbar Spine Wo Contrast  Result Date: 12/22/2018 CLINICAL DATA:  [place] 12/05/2018.  Continued back pain. EXAM: MRI THORACIC AND LUMBAR SPINE WITHOUT CONTRAST TECHNIQUE: Multiplanar and multiecho pulse sequences of the thoracic and lumbar spine were obtained without intravenous contrast. COMPARISON:  Plain films 12/08/2018 FINDINGS: MRI THORACIC SPINE FINDINGS Alignment: Slight increased kyphosis due to chronic deformity T12. No subluxation. Vertebrae: Acute compression fracture T10, superior endplate depression, minimal retropulsion posterosuperior aspect. No significant wedging. Chronic compression fracture T12, loss of approximately 1/3 vertebral body height, chronically retropulsed fragment into the canal roughly 4 mm. Cord: No cord compression or abnormal cord signal. No epidural hematoma. Paraspinal and other soft tissues: There is a paravertebral hematoma on the LEFT at T10, medial to the aorta. Disc levels: No significant thoracic disc protrusion is evident. There are no areas of compressive spinal stenosis. MRI LUMBAR SPINE FINDINGS Segmentation:  Standard. Alignment:  Anatomic except for 2 mm facet mediated slip L4-5. Vertebrae: No lumbar compression fracture. Fractures of the S2 and S3 sacral segments are identified however. There appears to be sacral canal stenosis based on mildly retropulsed S3. Extension of fracture into the sacral alae is difficult to assess. Conus medullaris and cauda equina: Conus extends to the L1 level. Conus and cauda equina appear normal. No definite epidural hematoma in the sacral canal Paraspinal and other soft tissues: Renal cystic disease, incompletely evaluated. Disc levels: L1-L2:  Unremarkable. L2-L3: Facet arthropathy. Unremarkable disc space. No impingement. L3-L4: Facet arthropathy. Unremarkable disc space. No impingement. L4-L5: 2 mm slip. Uncovering of the disc with  central protrusion. Posterior element hypertrophy. Mild stenosis. Mild subarticular zone narrowing could affect either L5 nerve root. No compressive foraminal narrowing. L5-S1: Disc space narrowing. Central protrusion. Facet arthropathy. Mild stenosis with borderline S1 neural impingement. BILATERAL foraminal narrowing is observed, worse on the LEFT, affecting primarily the LEFT L5 nerve root. IMPRESSION: MR THORACIC SPINE IMPRESSION Acute T10 compression fracture. Minimal retropulsion. LEFT paravertebral hematoma but no epidural hematoma or significant stenosis. Chronic T12 compression fracture, without significant stenosis or cord compression. MR LUMBAR SPINE IMPRESSION No acute lumbar spine fracture, but acute fractures of the S2 and S3 sacral segments are observed. Sacral canal stenosis is likely due to slight retropulsion/displacement of the S3 segment. Degenerative disc disease at L4-5 and L5-S1, related to 2 mm slip at L4-5, protruding disc material, and posterior element hypertrophy. Subarticular zone narrowing at L4-5 as well as foraminal narrowing at L5-S1 potentially affects the L5 nerve roots. See discussion above. Electronically Signed   By: Staci Righter M.D.   On: 12/22/2018 16:24        Scheduled Meds:  aspirin  81 mg Oral Daily   atorvastatin  10 mg Oral Daily   fesoterodine  4 mg Oral Daily   levothyroxine  88 mcg Oral Q0600   methylPREDNISolone  4 mg Oral PC lunch   methylPREDNISolone  4 mg Oral PC supper   methylPREDNISolone  4 mg Oral 3 x daily with food   [START ON 12/24/2018] methylPREDNISolone  4 mg Oral 4X daily taper   methylPREDNISolone  8 mg Oral Nightly  sertraline  50 mg Oral Daily   Continuous Infusions:   LOS: 0 days        Aline August, MD [facility] 12/23/2018, 12:26 PM

## 2018-12-23 NOTE — Evaluation (Signed)
Physical Therapy Evaluation Patient Details Name: Patricia [name] MRN: [id number] DOB: [birth year] Today's Date: 12/23/2018   History of Present Illness  Patricia [name] is a 85 y.o. female with medical history significant of hypothyroidism, CVA who is coming to the emergency department due to progressively worse back pain and difficulty walking after having a fall on 12/05/2018. Acute T10 compression fracture, L paravertebral hematoma, chronic t12 compr fx,   Clinical Impression  Pt in bed upon arrival and agreed to participate with therapy however extremely anxious about getting up and moving. Pt presents with pain, weakness, decreased sensation and balance limiting her ability to return home safely and independently. Pt reports a history of falls and she believes her neuropathy contributes to her falls. Pt educated on precautions and provided handout. Pt would benefit from skilled acute therapy to improve functional mobility and safety to allow for safe return home. Pt and daughter educated on plan to follow acutely and recommendation of discharge home with family assistance with pt and daughter consenting. After completion of Home Health PT it may be beneficial to discuss outpatient PT options for fall prevention.      Follow Up Recommendations Supervision for mobility/OOB;Home health PT    Equipment Recommendations  Rolling walker with 5" wheels    Recommendations for Other Services OT consult     Precautions / Restrictions Precautions Precautions: Back;Fall Precaution Booklet Issued: Yes (comment) Precaution Comments: Educated in back precautions for comfort and better tolerance of moving Required Braces or Orthoses: (no brace ordered) Restrictions Weight Bearing Restrictions: No      Mobility  Bed Mobility Overal bed mobility: Needs Assistance Bed Mobility: Rolling;Sidelying to Sit Rolling: Mod assist Sidelying to sit: Mod assist       General bed mobility comments: Cues for  log roll technique; Light mod assist to come to fully sidelying, and then to elevate trunk to sit; Slow moving, anxious at anticipation of pain  Transfers Overall transfer level: Needs assistance Equipment used: Rolling walker (2 wheeled) Transfers: Sit to/from Stand Sit to Stand: Mod assist         General transfer comment: cuing for hand placement, light mod assist to come rise, slow and guarded to rise, anxious about pain   Ambulation/Gait Ambulation/Gait assistance: Min assist Gait Distance (Feet): 30 Feet Assistive device: Rolling walker (2 wheeled) Gait Pattern/deviations: Step-through pattern;Decreased stride length;Trunk flexed;Antalgic Gait velocity: decreased   General Gait Details: very slow, guarded and cautious gait secondary to pain, pt required cuing for upright posture, pt educated for proper use of RW and for seeing her doctor if she experiences significantly increased pain/or weakness with ambulation at home  Stairs            Wheelchair Mobility    Modified Rankin (Stroke Patients Only)       Balance Overall balance assessment: History of Falls                                           Pertinent Vitals/Pain Pain Assessment: 0-10 Pain Score: 5  Pain Location: lower and upper back Pain Descriptors / Indicators: Aching;Grimacing;Guarding Pain Intervention(s): Monitored during session;Premedicated before session;Repositioned;Other (comment)(Followed back precautions)    Home Living Family/patient expects to be discharged to:: Private residence Living Arrangements: Alone Available Help at Discharge: Family;Available PRN/intermittently(Family is looking into rallying for assist for pt) Type of Home: House Home Access: Stairs to  enter Entrance Stairs-Rails: Right;Left(cannot reach both) Entrance Stairs-Number of Steps: 3-4 Home Layout: Able to live on main level with bedroom/bathroom;Multi-level        Prior Function Level of  Independence: Independent         Comments: Independent, still driving; pt does report a history of falls, and attributes part of that to her neuropathy     Hand Dominance        Extremity/Trunk Assessment   Upper Extremity Assessment Upper Extremity Assessment: Defer to OT evaluation    Lower Extremity Assessment Lower Extremity Assessment: Generalized weakness(Limitted by back pain)       Communication   Communication: No difficulties  Cognition Arousal/Alertness: Awake/alert Behavior During Therapy: WFL for tasks assessed/performed;Anxious(re: anticipation of pain) Overall Cognitive Status: Within Functional Limits for tasks assessed(for simple mobility tasks) Area of Impairment: Awareness                               General Comments: Patricia [name] does discuss her history of falls openly, and still insists on her independence      General Comments General comments (skin integrity, edema, etc.): pt reports having neuropathy in both feet, has fallen downstairs to get to washer/dryer, has fallen 7-8 times and often cannot get up without help    Exercises     Assessment/Plan    PT Assessment Patient needs continued PT services  PT Problem List Decreased strength;Decreased range of motion;Impaired sensation;Decreased activity tolerance;Decreased knowledge of use of DME;Decreased balance;Decreased safety awareness;Decreased mobility;Decreased knowledge of precautions       PT Treatment Interventions DME instruction;Therapeutic exercise;Gait training;Balance training;Stair training;Neuromuscular re-education;Functional mobility training;Therapeutic activities;Patient/family education;Cognitive remediation    PT Goals (Current goals can be found in the Care Plan section)  Acute Rehab PT Goals Patient Stated Goal: return home PT Goal Formulation: With patient Time For Goal Achievement: 12/30/18 Potential to Achieve Goals: Good    Frequency     Barriers  to discharge Decreased caregiver support daughter encouraged to work with other family to have help all the time, daughter states she cannot be there all the time and that she cannot physically lift her mother secondary to compression fractures     Co-evaluation               AM-PAC PT "6 Clicks" Mobility  Outcome Measure Help needed turning from your back to your side while in a flat bed without using bedrails?: A Lot Help needed moving from lying on your back to sitting on the side of a flat bed without using bedrails?: A Lot Help needed moving to and from a bed to a chair (including a wheelchair)?: A Lot Help needed standing up from a chair using your arms (e.g., wheelchair or bedside chair)?: A Lot Help needed to walk in hospital room?: A Little Help needed climbing 3-5 steps with a railing? : A Lot 6 Click Score: 13    End of Session Equipment Utilized During Treatment: Gait belt Activity Tolerance: Patient tolerated treatment well;Patient limited by pain Patient left: in chair;with chair alarm set;with family/visitor present;with call bell/phone within reach Nurse Communication: Mobility status PT Visit Diagnosis: Unsteadiness on feet (R26.81);Difficulty in walking, not elsewhere classified (R26.2);Muscle weakness (generalized) (M62.81)    Time: 8756-4332 PT Time Calculation (min) (ACUTE ONLY): 39 min   Charges:   PT Evaluation $PT Eval Moderate Complexity: 1 Mod PT Treatments $Gait Training: 8-22 mins $Therapeutic Activity: 8-22 mins  Zachary George, [place] [phone]   Patricia [name] 12/23/2018, 12:22 PM

## 2018-12-24 DIAGNOSIS — R296 Repeated falls: Secondary | ICD-10-CM

## 2018-12-24 DIAGNOSIS — E039 Hypothyroidism, unspecified: Secondary | ICD-10-CM | POA: Diagnosis not present

## 2018-12-24 DIAGNOSIS — S22070A Wedge compression fracture of T9-T10 vertebra, initial encounter for closed fracture: Secondary | ICD-10-CM | POA: Diagnosis not present

## 2018-12-24 DIAGNOSIS — M549 Dorsalgia, unspecified: Secondary | ICD-10-CM | POA: Diagnosis not present

## 2018-12-24 MED ORDER — OXYCODONE HCL 5 MG PO TABS
5.0000 mg | ORAL_TABLET | ORAL | Status: DC | PRN
  Administered 2018-12-24 – 2018-12-28 (×10): 5 mg via ORAL
  Filled 2018-12-24 (×6): qty 1
  Filled 2018-12-24: qty 2
  Filled 2018-12-24 (×3): qty 1

## 2018-12-24 NOTE — TOC Initial Note (Signed)
Transition of Care [facility]) - Initial/Assessment Note    [name] Details  Name: Patricia [name] MRN: [id number] Date of Birth: May 21, 1930  [facility]) CM/SW Contact:    Alberteen Sam, LCSW Phone Number: 12/24/2018, 10:05 AM  Clinical Narrative:                  CSW consulted with [name] daughter Patricia [name] who reports understanding PT recommendations of SNF and [name] has preference for [facility]. [name]'s daughter Patricia [name] reports she has called [facility] and they have a bed available for [name]. CSW explained insurance authorization process and will be updating family as soon as insurance auth with [employer] is obtained for [name] to go skilled nursing for short term physical therapy rehab.   Expected Discharge Plan: [place] Barriers to Discharge: No Barriers Identified   [name] Goals and CMS Choice [name] states their goals for this hospitalization and ongoing recovery are:: [name] would like to go to [place] and then go home CMS Medicare.gov Compare Post Acute Care list provided to:: [name] Represenative (must comment)(daughter Patricia [name]) Choice offered to / list presented to : Adult Children  Expected Discharge Plan and Services Expected Discharge Plan: [place] Discharge Planning Services: NA Post Acute Care Choice: [place] Living arrangements for the past 2 months: Single Family Home                 DME Arranged: N/A DME Agency: NA HH Arranged: NA HH Agency: NA  Prior Living Arrangements/Services Living arrangements for the past 2 months: [place] Lives with:: Self [name] language and need for interpreter reviewed:: Yes Do you feel safe going back to the place where you live?: No   [name] required SNF before safe discharge home  Need for Family Participation in [name] Care: Yes (Comment) Care giver support system in place?: Yes (comment)   Criminal Activity/Legal Involvement Pertinent to Current  Situation/Hospitalization: No - Comment as needed  Activities of Daily Living Home Assistive Devices/Equipment: Walker (specify type) ADL Screening (condition at time of admission) [name]'s cognitive ability adequate to safely complete daily activities?: No Is the [name] deaf or have difficulty hearing?: No Does the [name] have difficulty seeing, even when wearing glasses/contacts?: No Does the [name] have difficulty concentrating, remembering, or making decisions?: No [name] able to express need for assistance with ADLs?: Yes Does the [name] have difficulty dressing or bathing?: No Independently performs ADLs?: Yes (appropriate for developmental age) Does the [name] have difficulty walking or climbing stairs?: Yes Weakness of Legs: Both Weakness of Arms/Hands: None  Permission Sought/Granted Permission sought to share information with : Case Manager, Customer service manager, Family Supports Permission granted to share information with : Yes, Verbal Permission Granted  Share Information with NAME: Patricia [name]  Permission granted to share info w AGENCY: SNFs  Permission granted to share info w Relationship: daughter  Permission granted to share info w Contact Information: [phone]  Emotional Assessment Appearance:: Appears stated age Attitude/Demeanor/Rapport: Unable to Assess Affect (typically observed): Unable to Assess Orientation: : Oriented to Self, Oriented to Place, Oriented to  Time, Oriented to Situation Alcohol / Substance Use: Not Applicable Psych Involvement: No (comment)  Admission diagnosis:  Fall, initial encounter [W19.XXXA] Closed fracture of sacrum, unspecified portion of sacrum, initial encounter ([place]) [S32.10XA] Compression fracture of T10 vertebra, initial encounter ([place]) [E17.408X] [name] Active Problem List   Diagnosis Date Noted  . Intractable back pain 12/22/2018  . Hypothyroid 12/22/2018  . Hyperglycemia 12/22/2018  . Recurrent falls  04/27/2018  . Small fiber neuropathy 09/03/2016  . Compression fracture of thoracic vertebra (HCC) 06/21/2016  . MGUS (monoclonal gammopathy of unknown significance) 02/16/2016  . Quality of life palliative care encounter 02/16/2016  . Elevated blood pressure reading 02/16/2016  . Polyneuropathy 01/15/2016  . Neuropathy 01/10/2016  . Occipital stroke ([place]) 02/21/2015   PCP:  Deland Pretty, MD Pharmacy:   [facility] [place] - [address] [place] ([place]) [place] [address] Phone: [phone] Fax: [phone]     Social Determinants of Health (SDOH) Interventions    Readmission Risk Interventions  No flowsheet data found.

## 2018-12-24 NOTE — Progress Notes (Signed)
Occupational Therapy Treatment Patient Details Name: Patricia [name] MRN: [id number] DOB: Apr 19, 1930 Today's Date: 12/24/2018    History of present illness Patricia [name] is a 85 y.o. female with medical history significant of hypothyroidism, CVA who is coming to the emergency department due to progressively worse back pain and difficulty walking after having a fall on 12/05/2018. Acute T10 compression fracture, L paravertebral hematoma, chronic t12 compr fx,    OT comments  Pt progressing towards acute OT goals. Focus of session was toilet transfer, pericare, 1 grooming task standing at sink. Pt remain at min to mod A level for LB ADLs and functional transfers. D/cplan remains appropriate.   Follow Up Recommendations  SNF    Equipment Recommendations  Other (comment)(defer to next venue)    Recommendations for Other Services      Precautions / Restrictions Precautions Precautions: Back;Fall Precaution Comments: precautions reviewed with pt, daughter present Restrictions Weight Bearing Restrictions: No       Mobility Bed Mobility               General bed mobility comments: received in chair  Transfers Overall transfer level: Needs assistance Equipment used: Rolling walker (2 wheeled) Transfers: Sit to/from Stand Sit to Stand: Min guard         General transfer comment: cues for safe hand placement; min guard for safety    Balance Overall balance assessment: History of Falls(8 falls in 2 weeks per pt)                                         ADL either performed or assessed with clinical judgement   ADL Overall ADL's : Needs assistance/impaired     Grooming: Wash/dry hands;Minimal assistance;Standing;Min guard                   Toilet Transfer: Minimal assistance;RW(3n1 over toilet)   Toileting- Clothing Manipulation and Hygiene: Minimal assistance;Sit to/from stand       Functional mobility during ADLs: Minimal  assistance;Rolling walker General ADL Comments: Pt completed household distance functional mobility, toilet transfer, pericare, and grooming tasks at sink. Discussed AE for LB ADLs and strategies for ADLs with back precautions.      Vision       Perception     Praxis      Cognition Arousal/Alertness: Awake/alert Behavior During Therapy: WFL for tasks assessed/performed Overall Cognitive Status: Within Functional Limits for tasks assessed                                 General Comments: Ms. Allender does discuss her history of falls openly, and still insists on her independence        Exercises     Shoulder Instructions       General Comments daughter present    Pertinent Vitals/ Pain       Pain Assessment: Faces Faces Pain Scale: Hurts little more Pain Location: R side low back with movement Pain Descriptors / Indicators: Aching;Sore Pain Intervention(s): Monitored during session;Repositioned  Home Living                                          Prior Functioning/Environment  Frequency  Min 2X/week        Progress Toward Goals  OT Goals(current goals can now be found in the care plan section)  Progress towards OT goals: Progressing toward goals  Acute Rehab OT Goals Patient Stated Goal: rehab then home OT Goal Formulation: With patient Time For Goal Achievement: 01/06/19 Potential to Achieve Goals: Good ADL Goals Pt Will Perform Upper Body Bathing: with modified independence Pt Will Perform Lower Body Bathing: with modified independence;with adaptive equipment;sit to/from stand Pt Will Transfer to Toilet: with modified independence;ambulating;bedside commode;regular height toilet Pt Will Perform Tub/Shower Transfer: ambulating;with modified independence Additional ADL Goal #1: pt will complete bed mobitlity mod i as precursor to adls.  Plan Discharge plan remains appropriate    Co-evaluation                  AM-PAC OT "6 Clicks" Daily Activity     Outcome Measure   Help from another person eating meals?: None Help from another person taking care of personal grooming?: A Little Help from another person toileting, which includes using toliet, bedpan, or urinal?: A Lot Help from another person bathing (including washing, rinsing, drying)?: A Lot Help from another person to put on and taking off regular upper body clothing?: A Little Help from another person to put on and taking off regular lower body clothing?: A Lot 6 Click Score: 16    End of Session Equipment Utilized During Treatment: Rolling walker  OT Visit Diagnosis: Unsteadiness on feet (R26.81);Muscle weakness (generalized) (M62.81)   Activity Tolerance Patient tolerated treatment well   Patient Left in chair;with call bell/phone within reach;with family/visitor present   Nurse Communication          Time: 1210-1228 OT Time Calculation (min): 18 min  Charges: OT General Charges $OT Visit: 1 Visit OT Treatments $Self Care/Home Management : 8-22 mins  Tyrone Schimke, OT Acute Rehabilitation Services Pager: [phone] Office: [phone]    Hortencia Pilar 12/24/2018, 2:02 PM

## 2018-12-24 NOTE — NC FL2 (Signed)
[place] MEDICAID FL2 LEVEL OF CARE SCREENING TOOL     IDENTIFICATION  Patient Name: Patricia [name] Birthdate: 1930/01/19 Sex: female Admission Date (Current Location): 12/22/2018  [facility] and [place] Number:  Herbalist and Address:  [facility] [place], [place] [address] [place], [place] [address]      Provider Number: [id number]  Attending Physician Name and Address:  Aline August, MD  Relative Name and Phone Number:  Tomi Bamberger (daughter) [phone]    Current Level of Care: Hospital Recommended Level of Care: [place] Prior Approval Number:    Date Approved/Denied:   PASRR Number: [id number]  Discharge Plan: SNF    Current Diagnoses: Patient Active Problem List   Diagnosis Date Noted  . Intractable back pain 12/22/2018  . Hypothyroid 12/22/2018  . Hyperglycemia 12/22/2018  . Recurrent falls 04/27/2018  . Small fiber neuropathy 09/03/2016  . Compression fracture of thoracic vertebra (HCC) 06/21/2016  . MGUS (monoclonal gammopathy of unknown significance) 02/16/2016  . Quality of life palliative care encounter 02/16/2016  . Elevated blood pressure reading 02/16/2016  . Polyneuropathy 01/15/2016  . Neuropathy 01/10/2016  . Occipital stroke ([place]) 02/21/2015    Orientation RESPIRATION BLADDER Height & Weight     Self, Time, Situation, Place  Normal Incontinent, External catheter Weight: 173 lb (78.5 kg) Height:  5\' 6"  (167.6 cm)  BEHAVIORAL SYMPTOMS/MOOD NEUROLOGICAL BOWEL NUTRITION STATUS      Continent Diet(see discharge summary)  AMBULATORY STATUS COMMUNICATION OF NEEDS Skin   Limited Assist Verbally Normal                       Personal Care Assistance Level of Assistance  Bathing, Feeding, Dressing, Total care Bathing Assistance: Limited assistance Feeding assistance: Independent Dressing Assistance: Limited assistance Total Care Assistance: Limited assistance   Functional Limitations Info  Sight,  Hearing, Speech Sight Info: Adequate Hearing Info: Adequate Speech Info: Adequate    SPECIAL CARE FACTORS FREQUENCY  PT (By licensed PT), OT (By licensed OT)     PT Frequency: min 5x weekly OT Frequency: min 5x weekly            Contractures Contractures Info: Not present    Additional Factors Info  Code Status, Allergies Code Status Info: full Allergies Info: Fosamax (Alendronate Sodium)           Current Medications (12/24/2018):  This is the current hospital active medication list Current Facility-Administered Medications  Medication Dose Route Frequency Provider Last Rate Last Dose  . acetaminophen (TYLENOL) tablet 650 mg  650 mg Oral Q6H PRN Reubin Milan, MD       Or  . acetaminophen (TYLENOL) suppository 650 mg  650 mg Rectal Q6H PRN Reubin Milan, MD      . aspirin chewable tablet 81 mg  81 mg Oral Daily Reubin Milan, MD   81 mg at 12/24/18 0934  . atorvastatin (LIPITOR) tablet 10 mg  10 mg Oral Daily Reubin Milan, MD   10 mg at 12/24/18 0934  . fesoterodine (TOVIAZ) tablet 4 mg  4 mg Oral Daily Reubin Milan, MD   4 mg at 12/24/18 0933  . hydrALAZINE (APRESOLINE) injection 5 mg  5 mg Intravenous Q4H PRN Reubin Milan, MD   5 mg at 12/22/18 2135  . levothyroxine (SYNTHROID, LEVOTHROID) tablet 88 mcg  88 mcg Oral Q0600 Reubin Milan, MD   88 mcg at 12/24/18 [id number]  . Melatonin TABS 9  mg  9 mg Oral QHS PRN Reubin Milan, MD   9 mg at 12/23/18 2224  . methylPREDNISolone (MEDROL DOSEPAK) tablet 4 mg  4 mg Oral 4X daily taper Reubin Milan, MD   4 mg at 12/24/18 0934  . morphine 2 MG/ML injection 2 mg  2 mg Intravenous Q2H PRN Reubin Milan, MD   2 mg at 12/23/18 7121  . ondansetron (ZOFRAN) tablet 4 mg  4 mg Oral Q6H PRN Reubin Milan, MD       Or  . ondansetron [facility]) injection 4 mg  4 mg Intravenous Q6H PRN Reubin Milan, MD      . oxyCODONE (Oxy IR/ROXICODONE) immediate release tablet 5-10 mg   5-10 mg Oral Q4H PRN Starla Link, Kshitiz, MD      . polyethylene glycol (MIRALAX / GLYCOLAX) packet 17 g  17 g Oral Daily PRN Reubin Milan, MD      . senna-docusate (Senokot-S) tablet 1 tablet  1 tablet Oral BID Aline August, MD   1 tablet at 12/24/18 0933  . sertraline (ZOLOFT) tablet 50 mg  50 mg Oral Daily Reubin Milan, MD   50 mg at 12/24/18 9758     Discharge Medications: Please see discharge summary for a list of discharge medications.  Relevant Imaging Results:  Relevant Lab Results:   Additional Information SSN: [id number]  Alberteen Sam, LCSW

## 2018-12-24 NOTE — Plan of Care (Signed)
  Problem: Clinical Measurements: Goal: Ability to maintain clinical measurements within normal limits will improve Outcome: Progressing   Problem: Pain Managment: Goal: General experience of comfort will improve Outcome: Progressing   Problem: Safety: Goal: Ability to remain free from injury will improve Outcome: Progressing   

## 2018-12-24 NOTE — Progress Notes (Addendum)
Physical Therapy Treatment Patient Details Name: Patricia [name] MRN: [id number] DOB: 1930-04-24 Today's Date: 12/24/2018    History of Present Illness Omolara Carol is a 85 y.o. female with medical history significant of hypothyroidism, CVA who is coming to the emergency department due to progressively worse back pain and difficulty walking after having a fall on 12/05/2018. Acute T10 compression fracture, L paravertebral hematoma, chronic t12 compr fx,     PT Comments    Patient seen for mobility progression. Pt requires min guard/min A for mobility this session. Pt with increased R LE weakness with increased gait distance. Given pt's history of multiple falls and limited caregiver assistance available recommending SNF for further skilled PT services to maximize independence and safety with mobility.     Follow Up Recommendations  SNF     Equipment Recommendations  None recommended by PT    Recommendations for Other Services       Precautions / Restrictions Precautions Precautions: Back;Fall Precaution Comments: pt able to recall 1/3 back precautions; precautions reviewed with pt and pt's daughter Required Braces or Orthoses: (no brace ordered)    Mobility  Bed Mobility Overal bed mobility: Needs Assistance Bed Mobility: Rolling;Sidelying to Sit Rolling: Min assist Sidelying to sit: Min guard       General bed mobility comments: cues for sequencing and use of rail  Transfers Overall transfer level: Needs assistance Equipment used: Rolling walker (2 wheeled) Transfers: Sit to/from Stand Sit to Stand: Min guard         General transfer comment: cues for safe hand placement; min guard for safety  Ambulation/Gait Ambulation/Gait assistance: Min guard;Min assist Gait Distance (Feet): (30 ft X 2 ) Assistive device: Rolling walker (2 wheeled) Gait Pattern/deviations: Step-through pattern;Decreased stride length;Trunk flexed Gait velocity: decreased   General Gait  Details: cues for upright posture; decreased cadence; increased R LE weakness noted with increased distance   Stairs Stairs: Yes Stairs assistance: Min assist Stair Management: One rail Right;Step to pattern;Sideways Number of Stairs: 1 General stair comments: cues for sequencing, technique, and maintaining back precautions   Wheelchair Mobility    Modified Rankin (Stroke Patients Only)       Balance Overall balance assessment: History of Falls(8 falls in 2 weeks per pt)                                          Cognition Arousal/Alertness: Awake/alert Behavior During Therapy: WFL for tasks assessed/performed Overall Cognitive Status: Within Functional Limits for tasks assessed(for simple mobility tasks) Area of Impairment: Memory                     Memory: Decreased recall of precautions;Decreased short-term memory                Exercises      General Comments General comments (skin integrity, edema, etc.): daughter present      Pertinent Vitals/Pain Pain Assessment: Faces Faces Pain Scale: Hurts little more Pain Location: R side low back Pain Descriptors / Indicators: Aching;Grimacing;Guarding Pain Intervention(s): Limited activity within patient's tolerance;Monitored during session;Premedicated before session;Repositioned    Home Living                      Prior Function            PT Goals (current goals can now be found in the  care plan section) Progress towards PT goals: Progressing toward goals    Frequency    Min 5X/week      PT Plan Discharge plan needs to be updated    Co-evaluation              AM-PAC PT "6 Clicks" Mobility   Outcome Measure  Help needed turning from your back to your side while in a flat bed without using bedrails?: A Lot Help needed moving from lying on your back to sitting on the side of a flat bed without using bedrails?: A Little Help needed moving to and from a bed  to a chair (including a wheelchair)?: A Little Help needed standing up from a chair using your arms (e.g., wheelchair or bedside chair)?: A Little Help needed to walk in hospital room?: A Little Help needed climbing 3-5 steps with a railing? : A Little 6 Click Score: 17    End of Session Equipment Utilized During Treatment: Gait belt Activity Tolerance: Patient tolerated treatment well Patient left: in chair;with call bell/phone within reach;with family/visitor present Nurse Communication: Mobility status PT Visit Diagnosis: Unsteadiness on feet (R26.81);Difficulty in walking, not elsewhere classified (R26.2);Muscle weakness (generalized) (M62.81)     Time: 8546-2703 PT Time Calculation (min) (ACUTE ONLY): 33 min  Charges:  $Gait Training: 23-37 mins                     Earney Navy, PTA Acute Rehabilitation Services Pager: [phone] Office: [phone]     Darliss Cheney 12/24/2018, 9:34 AM

## 2018-12-24 NOTE — Progress Notes (Signed)
Patient ID: Patricia [name], female   DOB: 09-12-1930, 85 y.o.   MRN: [id number]  PROGRESS NOTE    Patricia [name]  CZY:[id number] DOB: 1929-11-02 DOA: 12/22/2018 PCP: Deland Pretty, MD   Brief Narrative:  85 year old female with history of hypothyroidism, unknown type of CVA presented with progressive back pain and difficulty walking after having a fall on 12/05/2018.  She was being treated by Medrol Dosepak by her prior PCP as an outpatient.  She was found to have acute T10 compression fracture with chronic T12 compression fracture, no spinal fractures but acute fractures of S2 and S3.  ED provider spoke to neurosurgery on call who recommended Medrol Dosepak treatment and outpatient follow-up with neurosurgery.  Orthopedics was also consulted by ED provider who recommended outpatient follow-up.  Assessment & Plan:   Principal Problem:   Intractable back pain Active Problems:   Elevated blood pressure reading   Compression fracture of thoracic vertebra (HCC)   Recurrent falls   Hypothyroid   Hyperglycemia  Intractable back pain Compression fracture of thoracic and sacral vertebrae Recurrent falls -ED provider spoke to neurosurgery on call/Dr. Venetia Constable who recommended Medrol Dosepak treatment and outpatient follow-up with neurosurgery in the next 1 to 2 weeks.  Orthopedics/Dr. Marlou Sa was also consulted by ED provider who stated that it may take up to 6 weeks for the sacral fractures to heal and recommended that patient be partially weightbearing with a walker and outpatient orthopedics follow-up. -Fall precautions  -PT had recommended home health PT on 12/23/2018 but OT recommended SNF on 12/23/2018. -This morning patient complains of increasing pain and is hardly able to get up and walk to the bathroom.  Will ask PT to reevaluate.  Might need SNF placement.  Social worker/care management consult. -continue Medrol Dosepak.   -Increase oxycodone to 5 to 10 mg every 6 hours as needed pain but be  judicious with the use.  Use IV morphine if severe pain.  Bowel regimen.  Hypothyroidism -Continue Synthroid  Dyslipidemia -Continue statin    DVT prophylaxis: Lovenox Code Status: Full Family Communication: Daughter at bedside  disposition Plan: Depends on further input from PT and patient's improvement of pain.  Consultants: On-call orthopedics and neurosurgeon were spoken to by the ED provider  Procedures: None  Antimicrobials: None   Subjective: Patient seen and examined at bedside.  Denies any bowel or bladder incontinence or worsening lower extremity pain.  States that she had more severe pain this morning and was hardly able to get up and walk to the bathroom.  No overnight fever or vomiting.  Complains of constipation. Objective: Vitals:   12/23/18 0856 12/23/18 1318 12/23/18 2004 12/24/18 0430  BP: (!) 158/80 125/64 139/72 132/66  Pulse:  64 75 65  Resp:  18 16   Temp:  98.2 F (36.8 C) 100.3 F (37.9 C) 98.3 F (36.8 C)  TempSrc:  Oral Oral Oral  SpO2:  97% 91% 96%  Weight:      Height:        Intake/Output Summary (Last 24 hours) at 12/24/2018 0924 Last data filed at 12/24/2018 0431 Gross per 24 hour  Intake 240 ml  Output 1680 ml  Net -1440 ml   Filed Weights   12/22/18 1304  Weight: 78.5 kg    Examination:  General exam: Appears calm and comfortable.  No acute distress Respiratory system: Bilateral decreased breath sounds at bases, no wheezing Cardiovascular system: S1 & S2 heard, rate controlled Gastrointestinal system: Abdomen is nondistended, soft and  nontender. Normal bowel sounds heard. Extremities: No cyanosis, edema   Data Reviewed: I have personally reviewed following labs and imaging studies  CBC: Recent Labs  Lab 12/22/18 1620  WBC 8.4  NEUTROABS 7.1  HGB 12.7  HCT 40.7  MCV 94.9  PLT 440   Basic Metabolic Panel: Recent Labs  Lab 12/22/18 1620  NA 139  K 4.0  CL 106  CO2 26  GLUCOSE 135*  BUN 10  CREATININE 0.72   CALCIUM 8.9   GFR: Estimated Creatinine Clearance: 53.4 mL/min (by C-G formula based on SCr of 0.72 mg/dL). Liver Function Tests: No results for input(s): AST, ALT, ALKPHOS, BILITOT, PROT, ALBUMIN in the last 168 hours. No results for input(s): LIPASE, AMYLASE in the last 168 hours. No results for input(s): AMMONIA in the last 168 hours. Coagulation Profile: No results for input(s): INR, PROTIME in the last 168 hours. Cardiac Enzymes: No results for input(s): CKTOTAL, CKMB, CKMBINDEX, TROPONINI in the last 168 hours. BNP (last 3 results) No results for input(s): PROBNP in the last 8760 hours. HbA1C: No results for input(s): HGBA1C in the last 72 hours. CBG: No results for input(s): GLUCAP in the last 168 hours. Lipid Profile: No results for input(s): CHOL, HDL, LDLCALC, TRIG, CHOLHDL, LDLDIRECT in the last 72 hours. Thyroid Function Tests: No results for input(s): TSH, T4TOTAL, FREET4, T3FREE, THYROIDAB in the last 72 hours. Anemia Panel: No results for input(s): VITAMINB12, FOLATE, FERRITIN, TIBC, IRON, RETICCTPCT in the last 72 hours. Sepsis Labs: No results for input(s): PROCALCITON, LATICACIDVEN in the last 168 hours.  No results found for this or any previous visit (from the past 240 hour(s)).       Radiology Studies: Mr Thoracic Spine Wo Contrast  Result Date: 12/22/2018 CLINICAL DATA:  [place] 12/05/2018.  Continued back pain. EXAM: MRI THORACIC AND LUMBAR SPINE WITHOUT CONTRAST TECHNIQUE: Multiplanar and multiecho pulse sequences of the thoracic and lumbar spine were obtained without intravenous contrast. COMPARISON:  Plain films 12/08/2018 FINDINGS: MRI THORACIC SPINE FINDINGS Alignment: Slight increased kyphosis due to chronic deformity T12. No subluxation. Vertebrae: Acute compression fracture T10, superior endplate depression, minimal retropulsion posterosuperior aspect. No significant wedging. Chronic compression fracture T12, loss of approximately 1/3 vertebral body  height, chronically retropulsed fragment into the canal roughly 4 mm. Cord: No cord compression or abnormal cord signal. No epidural hematoma. Paraspinal and other soft tissues: There is a paravertebral hematoma on the LEFT at T10, medial to the aorta. Disc levels: No significant thoracic disc protrusion is evident. There are no areas of compressive spinal stenosis. MRI LUMBAR SPINE FINDINGS Segmentation:  Standard. Alignment:  Anatomic except for 2 mm facet mediated slip L4-5. Vertebrae: No lumbar compression fracture. Fractures of the S2 and S3 sacral segments are identified however. There appears to be sacral canal stenosis based on mildly retropulsed S3. Extension of fracture into the sacral alae is difficult to assess. Conus medullaris and cauda equina: Conus extends to the L1 level. Conus and cauda equina appear normal. No definite epidural hematoma in the sacral canal Paraspinal and other soft tissues: Renal cystic disease, incompletely evaluated. Disc levels: L1-L2:  Unremarkable. L2-L3: Facet arthropathy. Unremarkable disc space. No impingement. L3-L4: Facet arthropathy. Unremarkable disc space. No impingement. L4-L5: 2 mm slip. Uncovering of the disc with central protrusion. Posterior element hypertrophy. Mild stenosis. Mild subarticular zone narrowing could affect either L5 nerve root. No compressive foraminal narrowing. L5-S1: Disc space narrowing. Central protrusion. Facet arthropathy. Mild stenosis with borderline S1 neural impingement. BILATERAL foraminal narrowing is  observed, worse on the LEFT, affecting primarily the LEFT L5 nerve root. IMPRESSION: MR THORACIC SPINE IMPRESSION Acute T10 compression fracture. Minimal retropulsion. LEFT paravertebral hematoma but no epidural hematoma or significant stenosis. Chronic T12 compression fracture, without significant stenosis or cord compression. MR LUMBAR SPINE IMPRESSION No acute lumbar spine fracture, but acute fractures of the S2 and S3 sacral segments  are observed. Sacral canal stenosis is likely due to slight retropulsion/displacement of the S3 segment. Degenerative disc disease at L4-5 and L5-S1, related to 2 mm slip at L4-5, protruding disc material, and posterior element hypertrophy. Subarticular zone narrowing at L4-5 as well as foraminal narrowing at L5-S1 potentially affects the L5 nerve roots. See discussion above. Electronically Signed   By: Staci Righter M.D.   On: 12/22/2018 16:24   Mr Lumbar Spine Wo Contrast  Result Date: 12/22/2018 CLINICAL DATA:  [place] 12/05/2018.  Continued back pain. EXAM: MRI THORACIC AND LUMBAR SPINE WITHOUT CONTRAST TECHNIQUE: Multiplanar and multiecho pulse sequences of the thoracic and lumbar spine were obtained without intravenous contrast. COMPARISON:  Plain films 12/08/2018 FINDINGS: MRI THORACIC SPINE FINDINGS Alignment: Slight increased kyphosis due to chronic deformity T12. No subluxation. Vertebrae: Acute compression fracture T10, superior endplate depression, minimal retropulsion posterosuperior aspect. No significant wedging. Chronic compression fracture T12, loss of approximately 1/3 vertebral body height, chronically retropulsed fragment into the canal roughly 4 mm. Cord: No cord compression or abnormal cord signal. No epidural hematoma. Paraspinal and other soft tissues: There is a paravertebral hematoma on the LEFT at T10, medial to the aorta. Disc levels: No significant thoracic disc protrusion is evident. There are no areas of compressive spinal stenosis. MRI LUMBAR SPINE FINDINGS Segmentation:  Standard. Alignment:  Anatomic except for 2 mm facet mediated slip L4-5. Vertebrae: No lumbar compression fracture. Fractures of the S2 and S3 sacral segments are identified however. There appears to be sacral canal stenosis based on mildly retropulsed S3. Extension of fracture into the sacral alae is difficult to assess. Conus medullaris and cauda equina: Conus extends to the L1 level. Conus and cauda equina appear  normal. No definite epidural hematoma in the sacral canal Paraspinal and other soft tissues: Renal cystic disease, incompletely evaluated. Disc levels: L1-L2:  Unremarkable. L2-L3: Facet arthropathy. Unremarkable disc space. No impingement. L3-L4: Facet arthropathy. Unremarkable disc space. No impingement. L4-L5: 2 mm slip. Uncovering of the disc with central protrusion. Posterior element hypertrophy. Mild stenosis. Mild subarticular zone narrowing could affect either L5 nerve root. No compressive foraminal narrowing. L5-S1: Disc space narrowing. Central protrusion. Facet arthropathy. Mild stenosis with borderline S1 neural impingement. BILATERAL foraminal narrowing is observed, worse on the LEFT, affecting primarily the LEFT L5 nerve root. IMPRESSION: MR THORACIC SPINE IMPRESSION Acute T10 compression fracture. Minimal retropulsion. LEFT paravertebral hematoma but no epidural hematoma or significant stenosis. Chronic T12 compression fracture, without significant stenosis or cord compression. MR LUMBAR SPINE IMPRESSION No acute lumbar spine fracture, but acute fractures of the S2 and S3 sacral segments are observed. Sacral canal stenosis is likely due to slight retropulsion/displacement of the S3 segment. Degenerative disc disease at L4-5 and L5-S1, related to 2 mm slip at L4-5, protruding disc material, and posterior element hypertrophy. Subarticular zone narrowing at L4-5 as well as foraminal narrowing at L5-S1 potentially affects the L5 nerve roots. See discussion above. Electronically Signed   By: Staci Righter M.D.   On: 12/22/2018 16:24        Scheduled Meds: . aspirin  81 mg Oral Daily  . atorvastatin  10 mg  Oral Daily  . fesoterodine  4 mg Oral Daily  . levothyroxine  88 mcg Oral Q0600  . methylPREDNISolone  4 mg Oral 4X daily taper  . senna-docusate  1 tablet Oral BID  . sertraline  50 mg Oral Daily   Continuous Infusions:   LOS: 0 days        Aline August, MD [facility]  12/24/2018, 9:24 AM

## 2018-12-25 DIAGNOSIS — M549 Dorsalgia, unspecified: Secondary | ICD-10-CM | POA: Diagnosis not present

## 2018-12-25 MED ORDER — METHYLPREDNISOLONE SODIUM SUCC 125 MG IJ SOLR
INTRAMUSCULAR | Status: AC
  Filled 2018-12-25: qty 2

## 2018-12-25 NOTE — Progress Notes (Signed)
CSW received insurance authorization with [facility] notified.   [facility] reports they have 2 residents with a "gi bug" and will not be able to admit patient until Monday 3/23 to ensure the health of all new admittances.   [place], [place]

## 2018-12-25 NOTE — Progress Notes (Signed)
PROGRESS NOTE    Patricia [name]  DJT:[id number] DOB: Jul 27, 1930 DOA: 12/22/2018 PCP: Deland Pretty, MD    Brief Narrative: 85 year old female with history of hypothyroidism, unknown type of CVA presented with progressive back pain and difficulty walking after having a fall on 12/05/2018.  She was being treated by Medrol Dosepak by her prior PCP as an outpatient.  She was found to have acute T10 compression fracture with chronic T12 compression fracture, no spinal fractures but acute fractures of S2 and S3.  ED provider spoke to neurosurgery on call who recommended Medrol Dosepak treatment and outpatient follow-up with neurosurgery.  Orthopedics was also consulted by ED provider who recommended outpatient follow-up.   Assessment & Plan:   Principal Problem:   Intractable back pain Active Problems:   Elevated blood pressure reading   Compression fracture of thoracic vertebra (HCC)   Recurrent falls   Hypothyroid   Hyperglycemia   Intractable back pain secondary to compression fractures of the thoracic and sacral vertebra from recurrent falls. On admission EDP discussed case with neurosurgeon Dr. Venetia Constable recommended Medrol Dosepak and follow-up with neurosurgery in 1 to 2 weeks.  Dr. Marlou Sa from orthopedics was also consulted by EDP suggested that it will take about 6 weeks for his the sacral fractures to heal, recommended partial weightbearing with a walker and outpatient follow-up with them.   PT and OT recommended SNF.  Social work consulted and plan for discharge on Monday.  Meanwhile continue with Medrol Dosepak and oxycodone as needed with good bowel regimen.   Hypothyroidism Continue with Synthroid   Dyslipidemia continue with statin.   DVT prophylaxis: Lovenox code Status: Full code Family Communication family at bedside  disposition Plan: Discharged to SNF on Monday   Consultants:  On-call neurosurgeon and orthopedics were called and discussed by the EDP   Procedures: None Antimicrobials: None  Subjective: Pain better today  Objective: Vitals:   12/25/18 0300 12/25/18 0433 12/25/18 0700 12/25/18 0942  BP:  (!) 149/79 (!) 153/68 138/77  Pulse:  63  67  Resp: 16 14    Temp:  98.4 F (36.9 C)    TempSrc:  Oral    SpO2:  94%    Weight:      Height:        Intake/Output Summary (Last 24 hours) at 12/25/2018 1330 Last data filed at 12/24/2018 2300 Gross per 24 hour  Intake 640 ml  Output -  Net 640 ml   Filed Weights   12/22/18 1304  Weight: 78.5 kg    Examination:  General exam: Appears calm and comfortable  Respiratory system: Clear to auscultation. Respiratory effort normal. Cardiovascular system: S1 & S2 heard, RRR. No JVD, murmurs, rubs, gallops or clicks. No pedal edema. Gastrointestinal system: Abdomen is nondistended, soft and nontender. No organomegaly or masses felt. Normal bowel sounds heard. Central nervous system: Alert and oriented. No focal neurological deficits. Extremities: Symmetric 5 x 5 power. Skin: No rashes, lesions or ulcers Psychiatry: Judgement and insight appear normal. Mood & affect appropriate.     Data Reviewed: I have personally reviewed following labs and imaging studies  CBC: Recent Labs  Lab 12/22/18 1620  WBC 8.4  NEUTROABS 7.1  HGB 12.7  HCT 40.7  MCV 94.9  PLT 300   Basic Metabolic Panel: Recent Labs  Lab 12/22/18 1620  NA 139  K 4.0  CL 106  CO2 26  GLUCOSE 135*  BUN 10  CREATININE 0.72  CALCIUM 8.9   GFR: Estimated Creatinine  Clearance: 53.4 mL/min (by C-G formula based on SCr of 0.72 mg/dL). Liver Function Tests: No results for input(s): AST, ALT, ALKPHOS, BILITOT, PROT, ALBUMIN in the last 168 hours. No results for input(s): LIPASE, AMYLASE in the last 168 hours. No results for input(s): AMMONIA in the last 168 hours. Coagulation Profile: No results for input(s): INR, PROTIME in the last 168 hours. Cardiac Enzymes: No results for input(s): CKTOTAL, CKMB,  CKMBINDEX, TROPONINI in the last 168 hours. BNP (last 3 results) No results for input(s): PROBNP in the last 8760 hours. HbA1C: No results for input(s): HGBA1C in the last 72 hours. CBG: No results for input(s): GLUCAP in the last 168 hours. Lipid Profile: No results for input(s): CHOL, HDL, LDLCALC, TRIG, CHOLHDL, LDLDIRECT in the last 72 hours. Thyroid Function Tests: No results for input(s): TSH, T4TOTAL, FREET4, T3FREE, THYROIDAB in the last 72 hours. Anemia Panel: No results for input(s): VITAMINB12, FOLATE, FERRITIN, TIBC, IRON, RETICCTPCT in the last 72 hours. Sepsis Labs: No results for input(s): PROCALCITON, LATICACIDVEN in the last 168 hours.  No results found for this or any previous visit (from the past 240 hour(s)).       Radiology Studies: No results found.      Scheduled Meds: . aspirin  81 mg Oral Daily  . atorvastatin  10 mg Oral Daily  . fesoterodine  4 mg Oral Daily  . levothyroxine  88 mcg Oral Q0600  . methylPREDNISolone  4 mg Oral 4X daily taper  . senna-docusate  1 tablet Oral BID  . sertraline  50 mg Oral Daily   Continuous Infusions:   LOS: 0 days    Time spent: 35 minutes.     Hosie Poisson, MD [facility] Pager [phone]   If 7PM-7AM, please contact night-coverage [email or web address] Password [email or web address] 12/25/2018, 1:30 PM

## 2018-12-25 NOTE — Plan of Care (Signed)
  Problem: Elimination: Goal: Will not experience complications related to bowel motility Outcome: Progressing Goal: Will not experience complications related to urinary retention Outcome: Progressing   

## 2018-12-25 NOTE — Progress Notes (Signed)
Physical Therapy Treatment Patient Details Name: Patricia [name] MRN: [id number] DOB: 07-10-1930 Today's Date: 12/25/2018    History of Present Illness Patricia [name] is a 85 y.o. female with medical history significant of hypothyroidism, CVA who is coming to the emergency department due to progressively worse back pain and difficulty walking after having a fall on 12/05/2018. Acute T10 compression fracture, L paravertebral hematoma, chronic t12 compr fx,     PT Comments    Patient seen for mobility progression. Pt requires min guard/min A overall for mobility. Continue to progress as tolerated.    Follow Up Recommendations  SNF     Equipment Recommendations  None recommended by PT    Recommendations for Other Services       Precautions / Restrictions Precautions Precautions: Back;Fall Precaution Comments: precautions reviewed with pt Required Braces or Orthoses: (no brace ordered)    Mobility  Bed Mobility Overal bed mobility: Needs Assistance Bed Mobility: Rolling;Sidelying to Sit Rolling: Min guard Sidelying to sit: Min assist       General bed mobility comments: cues for sequencing/technique; use of rail and assist needed to elevate trunk into sitting  Transfers Overall transfer level: Needs assistance Equipment used: Rolling walker (2 wheeled) Transfers: Sit to/from [facility] Sit to Stand: Min guard         General transfer comment: cues for maintaining back precautions and for safe hand placement  Ambulation/Gait Ambulation/Gait assistance: Min guard;Min assist Gait Distance (Feet): 100 Feet Assistive device: Rolling walker (2 wheeled) Gait Pattern/deviations: Step-through pattern;Decreased stride length;Trunk flexed;Decreased step length - right Gait velocity: decreased   General Gait Details: pt with increased with R steps; cues for upright posture, forward gaze, and engaging abdominals   Stairs             Wheelchair  Mobility    Modified Rankin (Stroke Patients Only)       Balance Overall balance assessment: History of Falls(8 falls in 2 weeks per pt)                                          Cognition Arousal/Alertness: Awake/alert Behavior During Therapy: WFL for tasks assessed/performed Overall Cognitive Status: Within Functional Limits for tasks assessed Area of Impairment: Memory                     Memory: Decreased recall of precautions;Decreased short-term memory         General Comments: MD in to speak with pt during session and ~4 minutes later while ambulating pt asked "were you in there when the doctor came?" and proceeded to tell me what MD said      Exercises      General Comments        Pertinent Vitals/Pain Pain Assessment: 0-10 Pain Score: 5  Pain Location: R side pelvis/back  Pain Descriptors / Indicators: Sore Pain Intervention(s): Limited activity within patient's tolerance;Monitored during session;Premedicated before session;Repositioned    Home Living                      Prior Function            PT Goals (current goals can now be found in the care plan section) Progress towards PT goals: Progressing toward goals    Frequency    Min 5X/week      PT Plan Current  plan remains appropriate    Co-evaluation              AM-PAC PT "6 Clicks" Mobility   Outcome Measure  Help needed turning from your back to your side while in a flat bed without using bedrails?: A Little Help needed moving from lying on your back to sitting on the side of a flat bed without using bedrails?: A Little Help needed moving to and from a bed to a chair (including a wheelchair)?: A Little Help needed standing up from a chair using your arms (e.g., wheelchair or bedside chair)?: A Little Help needed to walk in hospital room?: A Little Help needed climbing 3-5 steps with a railing? : A Little 6 Click Score: 18    End of Session  Equipment Utilized During Treatment: Gait belt Activity Tolerance: Patient tolerated treatment well Patient left: in chair;with call bell/phone within reach;with family/visitor present Nurse Communication: Mobility status PT Visit Diagnosis: Unsteadiness on feet (R26.81);Difficulty in walking, not elsewhere classified (R26.2);Muscle weakness (generalized) (M62.81)     Time: 7494-4967 PT Time Calculation (min) (ACUTE ONLY): 22 min  Charges:  $Gait Training: 8-22 mins                     Earney Navy, PTA Acute Rehabilitation Services Pager: [phone] Office: [phone]     Darliss Cheney 12/25/2018, 1:43 PM

## 2018-12-25 NOTE — Progress Notes (Signed)
PT Cancellation Note  Patient Details Name: Patricia [name] MRN: [id number] DOB: Oct 03, 1930   Cancelled Treatment:    Reason Eval/Treat Not Completed: Patient declined, no reason specified Patient declined at this time and reports wanting to get pain medicine and eat lunch prior to participating in PT.    Salina April, PTA Acute Rehabilitation Services Pager: [phone] Office: [phone]   12/25/2018, 12:05 PM

## 2018-12-25 NOTE — Progress Notes (Signed)
Occupational Therapy Treatment Patient Details Name: Patricia [name] MRN: [id number] DOB: 1930-04-05 Today's Date: 12/25/2018    History of present illness Patricia [name] is a 85 y.o. female with medical history significant of hypothyroidism, CVA who is coming to the emergency department due to progressively worse back pain and difficulty walking after having a fall on 12/05/2018. Acute T10 compression fracture, L paravertebral hematoma, chronic t12 compr fx,    OT comments  Pt. Agreeable to participation in skilled OT. Able to complete amb. To/from b.room including toileting task with min guard a.  Would benefit from A/E for LB ADLs.  Will introduce at next session.  Note d/c likely to snf soon.    Follow Up Recommendations  SNF    Equipment Recommendations       Recommendations for Other Services      Precautions / Restrictions Precautions Precautions: Back;Fall       Mobility Bed Mobility               General bed mobility comments: received in chair  Transfers   Equipment used: Rolling walker (2 wheeled) Transfers: Sit to/from [facility] Sit to Stand: Min guard Stand pivot transfers: Min guard       General transfer comment: cues for safe hand placement; min guard for safety    Balance                                           ADL either performed or assessed with clinical judgement   ADL Overall ADL's : Needs assistance/impaired     Grooming: Wash/dry hands;Minimal assistance;Standing;Min guard               Lower Body Dressing: Maximal assistance;Sitting/lateral leans Lower Body Dressing Details (indicate cue type and reason): attempted crossing legs over knees to reach feet, pt. unable.  would benefit from introduction of A/E for LB ADLS. introduced topic to pt. Toilet Transfer: Min guard;Ambulation;RW;Grab bars;BSC;Regular Toilet;Cueing for sequencing;Cueing for safety Toilet Transfer Details (indicate cue type  and reason): 3n1 over commode, cues for backing all the way up to surface before sitting down and also using arm rest vs. pulling on rw Toileting- Clothing Manipulation and Hygiene: Sit to/from stand;Min guard;Cueing for back precautions Toileting - Clothing Manipulation Details (indicate cue type and reason): reviewed not to turn and "twist" to flush, has to make complete turn to adhere to back precautions     Functional mobility during ADLs: Min guard;Rolling walker General ADL Comments: introduced A/E as option for maintaining independence with LB ADLs.  will introduce at next visit or reviewed with her that next venue snf would likely introduce during her cont. therapy there also     Vision       Perception     Praxis      Cognition Arousal/Alertness: Awake/alert Behavior During Therapy: WFL for tasks assessed/performed Overall Cognitive Status: Within Functional Limits for tasks assessed                                          Exercises     Shoulder Instructions       General Comments      Pertinent Vitals/ Pain       Pain Assessment: 0-10 Pain Score: 2  Pain Location:  back Pain Descriptors / Indicators: Aching;Sore Pain Intervention(s): Limited activity within patient's tolerance;Monitored during session;Repositioned  Home Living                                          Prior Functioning/Environment              Frequency  Min 2X/week        Progress Toward Goals  OT Goals(current goals can now be found in the care plan section)  Progress towards OT goals: Progressing toward goals     Plan Discharge plan remains appropriate    Co-evaluation                 AM-PAC OT "6 Clicks" Daily Activity     Outcome Measure   Help from another person eating meals?: None Help from another person taking care of personal grooming?: A Little Help from another person toileting, which includes using toliet, bedpan,  or urinal?: A Lot Help from another person bathing (including washing, rinsing, drying)?: A Lot Help from another person to put on and taking off regular upper body clothing?: A Little Help from another person to put on and taking off regular lower body clothing?: A Lot 6 Click Score: 16    End of Session Equipment Utilized During Treatment: Gait belt;Rolling walker  OT Visit Diagnosis: Unsteadiness on feet (R26.81);Muscle weakness (generalized) (M62.81)   Activity Tolerance Patient tolerated treatment well   Patient Left in chair;with call bell/phone within reach   Nurse Communication          Time: 2774-1287 OT Time Calculation (min): 15 min  Charges: OT General Charges $OT Visit: 1 Visit OT Treatments $Self Care/Home Management : 8-22 mins   Janice Coffin, COTA/L 12/25/2018, 8:24 AM

## 2018-12-26 DIAGNOSIS — M549 Dorsalgia, unspecified: Secondary | ICD-10-CM | POA: Diagnosis not present

## 2018-12-26 NOTE — Progress Notes (Signed)
Physical Therapy Treatment Patient Details Name: Patricia [name] MRN: [id number] DOB: 1929-10-11 Today's Date: 12/26/2018    History of Present Illness Patricia [name] is a 85 y.o. female with medical history significant of hypothyroidism, CVA who is coming to the emergency department due to progressively worse back pain and difficulty walking after having a fall on 12/05/2018. Acute T10 compression fracture, L paravertebral hematoma, chronic t12 compr fx,     PT Comments    Per MD note - pt PWB right leg.  Educated pt on this today. She was 0/10 pain at rest, 9/10 right hip/back pain with PWB right weight bearing and 3/10 pain after 80 foot walk.  Emphasis on mobility with PWB education.  Daughter present.  Pt planning to DC SNF on Monday.    Follow Up Recommendations        Equipment Recommendations  None recommended by PT    Recommendations for Other Services       Precautions / Restrictions Precautions Precautions: Back;Fall Precaution Comments: precautions reviewed with pt - per MD note - pt PWB right leg Restrictions Weight Bearing Restrictions: Yes RLE Weight Bearing: Partial weight bearing Other Position/Activity Restrictions: per MD note - pt PWB right leg when up    Mobility  Bed Mobility Overal bed mobility: Needs Assistance Bed Mobility: Supine to Sit     Supine to sit: Min assist     General bed mobility comments: pt given cues and min assist to get up - hurts when she moves right leg so assisted her with this  Transfers Overall transfer level: Needs assistance Equipment used: Rolling walker (2 wheeled)   Sit to Stand: Min guard;From elevated surface         General transfer comment: cues for maintaining back precautions and for safe hand placement.  pt cued on kicking out right leg with stand to sit  Ambulation/Gait Ambulation/Gait assistance: Min guard Gait Distance (Feet): 80 Feet Assistive device: Rolling walker (2 wheeled) Gait Pattern/deviations:  Step-to pattern;Decreased stance time - right;Antalgic     General Gait Details: Pt educated on right PWB technque - with turning and backing up etc.  Daughter present for education.  Pt did well - UEs got tired.   Stairs             Wheelchair Mobility    Modified Rankin (Stroke Patients Only)       Balance Overall balance assessment: History of Falls                                          Cognition Arousal/Alertness: Awake/alert Behavior During Therapy: WFL for tasks assessed/performed Overall Cognitive Status: Within Functional Limits for tasks assessed                                 General Comments: pt pleasant and receptive.  Daughter came in during therapy and taught her PWB technques as well      Exercises      General Comments General comments (skin integrity, edema, etc.): daughter present and educated on [facility] technque      Pertinent Vitals/Pain Pain Score: (pt 0 at rest, 9 right hip/back with WB, 3 after walk) Pain Intervention(s): Limited activity within patient's tolerance;Repositioned;Monitored during session    Home Living  Prior Function            PT Goals (current goals can now be found in the care plan section) Acute Rehab PT Goals Patient Stated Goal: rehab then home Progress towards PT goals: Progressing toward goals    Frequency    Min 5X/week      PT Plan Current plan remains appropriate    Co-evaluation              AM-PAC PT "6 Clicks" Mobility   Outcome Measure  Help needed turning from your back to your side while in a flat bed without using bedrails?: A Little Help needed moving from lying on your back to sitting on the side of a flat bed without using bedrails?: A Little Help needed moving to and from a bed to a chair (including a wheelchair)?: A Little Help needed standing up from a chair using your arms (e.g., wheelchair or bedside chair)?: A  Little Help needed to walk in hospital room?: A Little Help needed climbing 3-5 steps with a railing? : A Lot 6 Click Score: 17    End of Session Equipment Utilized During Treatment: Gait belt Activity Tolerance: Patient tolerated treatment well Patient left: in chair;with call bell/phone within reach;with family/visitor present Nurse Communication: Mobility status PT Visit Diagnosis: Unsteadiness on feet (R26.81);Difficulty in walking, not elsewhere classified (R26.2);Muscle weakness (generalized) (M62.81);Pain Pain - Right/Left: Right Pain - part of body: Hip     Time: 9774-1423 PT Time Calculation (min) (ACUTE ONLY): 20 min  Charges:  $Gait Training: 8-22 mins                    12/26/2018   Rande Lawman, PT    Loyal Buba 12/26/2018, 10:25 AM

## 2018-12-26 NOTE — Progress Notes (Signed)
PROGRESS NOTE    Patricia [name]  BTD:[id number] DOB: Jan 02, 1930 DOA: 12/22/2018 PCP: Deland Pretty, MD   Brief Narrative:85 year old female with history of hypothyroidism, unknown type of CVA presented with progressive back pain and difficulty walking after having a fall on 12/05/2018. She was being treated by Medrol Dosepak by her prior PCP as an outpatient. She was found to have acute T10 compression fracture with chronic T12 compression fracture, no spinal fractures but acute fractures of S2 and S3. ED provider spoke to neurosurgery on call who recommended Medrol Dosepak treatment and outpatient follow-up with neurosurgery. Orthopedics was also consulted by ED provider who recommended outpatient follow up Assessment & Plan:   Principal Problem:   Intractable back pain Active Problems:   Elevated blood pressure reading   Compression fracture of thoracic vertebra (HCC)   Recurrent falls   Hypothyroid   Hyperglycemia   Intractable back pain secondary to compression fractures of the thoracic and sacral vertebra from recurrent falls. On admission EDP discussed case with neurosurgeon Dr. Venetia Constable recommended Medrol Dosepak and follow-up with neurosurgery in 1 to 2 weeks.  Dr. Marlou Sa from orthopedics was also consulted by EDP suggested that it will take about 6 weeks for his the sacral fractures to heal, recommended partial weightbearing with a walker and outpatient follow-up with them.   PT and OT recommended SNF.  Social work consulted and plan for discharge on Monday.  Meanwhile continue with Medrol Dosepak and oxycodone as needed with good bowel regimen.   Hypothyroidism Continue with Synthroid   Dyslipidemia continue with statin.  Constipation will start stool softeners.  DVT prophylaxis: Lovenox code Status: Full code Family Communication no  family at bedside  disposition Plan: Discharged to SNF on Monday   Consultants:  On-call neurosurgeon and  orthopedics were called and discussed by the EDP  Procedures: None Antimicrobials: None    Estimated body mass index is 27.92 kg/m as calculated from the following:   Height as of this encounter: 5\' 6"  (1.676 m).   Weight as of this encounter: 78.5 kg.  Subjective: Feeling better anxious to be discharged complains of constipation denies chest pain shortness of breath or cough nausea or vomiting  Objective: Vitals:   12/25/18 1536 12/25/18 2047 12/25/18 2049 12/26/18 0420  BP: (!) 147/73 (!) 166/80 (!) 167/76 (!) 163/80  Pulse: 73 84  72  Resp: 17     Temp: 98.5 F (36.9 C) 98.6 F (37 C)  98.2 F (36.8 C)  TempSrc: Oral Oral  Oral  SpO2: 96% 95%  97%  Weight:      Height:        Intake/Output Summary (Last 24 hours) at 12/26/2018 0848 Last data filed at 12/25/2018 1245 Gross per 24 hour  Intake 480 ml  Output -  Net 480 ml   Filed Weights   12/22/18 1304  Weight: 78.5 kg    Examination:  General exam: Appears calm and comfortable  Respiratory system: Clear to auscultation. Respiratory effort normal. Cardiovascular system: S1 & S2 heard, RRR. No JVD, murmurs, rubs, gallops or clicks. No pedal edema. Gastrointestinal system: Abdomen is nondistended, soft and nontender. No organomegaly or masses felt. Normal bowel sounds heard. Central nervous system: Alert and oriented. No focal neurological deficits. Extremities: Symmetric 5 x 5 power. Skin: No rashes, lesions or ulcers Psychiatry: Judgement and insight appear normal. Mood & affect appropriate.     Data Reviewed: I have personally reviewed following labs and imaging studies  CBC: Recent Labs  Lab  12/22/18 1620  WBC 8.4  NEUTROABS 7.1  HGB 12.7  HCT 40.7  MCV 94.9  PLT 076   Basic Metabolic Panel: Recent Labs  Lab 12/22/18 1620  NA 139  K 4.0  CL 106  CO2 26  GLUCOSE 135*  BUN 10  CREATININE 0.72  CALCIUM 8.9   GFR: Estimated Creatinine Clearance: 53.4 mL/min (by C-G formula based on  SCr of 0.72 mg/dL). Liver Function Tests: No results for input(s): AST, ALT, ALKPHOS, BILITOT, PROT, ALBUMIN in the last 168 hours. No results for input(s): LIPASE, AMYLASE in the last 168 hours. No results for input(s): AMMONIA in the last 168 hours. Coagulation Profile: No results for input(s): INR, PROTIME in the last 168 hours. Cardiac Enzymes: No results for input(s): CKTOTAL, CKMB, CKMBINDEX, TROPONINI in the last 168 hours. BNP (last 3 results) No results for input(s): PROBNP in the last 8760 hours. HbA1C: No results for input(s): HGBA1C in the last 72 hours. CBG: No results for input(s): GLUCAP in the last 168 hours. Lipid Profile: No results for input(s): CHOL, HDL, LDLCALC, TRIG, CHOLHDL, LDLDIRECT in the last 72 hours. Thyroid Function Tests: No results for input(s): TSH, T4TOTAL, FREET4, T3FREE, THYROIDAB in the last 72 hours. Anemia Panel: No results for input(s): VITAMINB12, FOLATE, FERRITIN, TIBC, IRON, RETICCTPCT in the last 72 hours. Sepsis Labs: No results for input(s): PROCALCITON, LATICACIDVEN in the last 168 hours.  No results found for this or any previous visit (from the past 240 hour(s)).       Radiology Studies: No results found.      Scheduled Meds: . aspirin  81 mg Oral Daily  . atorvastatin  10 mg Oral Daily  . fesoterodine  4 mg Oral Daily  . levothyroxine  88 mcg Oral Q0600  . methylPREDNISolone  4 mg Oral 4X daily taper  . senna-docusate  1 tablet Oral BID  . sertraline  50 mg Oral Daily   Continuous Infusions:   LOS: 0 days     Georgette Shell, MD [facility]  If 7PM-7AM, please contact night-coverage [email or web address] Password [email or web address] 12/26/2018, 8:48 AM

## 2018-12-27 DIAGNOSIS — M549 Dorsalgia, unspecified: Secondary | ICD-10-CM | POA: Diagnosis not present

## 2018-12-27 NOTE — Progress Notes (Signed)
PROGRESS NOTE    Patricia [name]  ZDG:[id number] DOB: Feb 13, 1930 DOA: 12/22/2018 PCP: Deland Pretty, MD   Brief Narrative:85 year old female with history of hypothyroidism, unknown type of CVA presented with progressive back pain and difficulty walking after having a fall on 12/05/2018. She was being treated by Medrol Dosepak by her prior PCP as an outpatient. She was found to have acute T10 compression fracture with chronic T12 compression fracture, no spinal fractures but acute fractures of S2 and S3. ED provider spoke to neurosurgery on call who recommended Medrol Dosepak treatment and outpatient follow-up with neurosurgery. Orthopedics was also consulted by ED provider who recommended outpatient follow up  Assessment & Plan:   Principal Problem:   Intractable back pain Active Problems:   Elevated blood pressure reading   Compression fracture of thoracic vertebra (HCC)   Recurrent falls   Hypothyroid   Hyperglycemia   Intractable back pain secondary to compression fractures of the thoracic and sacral vertebra from recurrent falls. On admission EDP discussed case with neurosurgeon Dr. Venetia Constable recommended Medrol Dosepak and follow-up with neurosurgery in 1 to 2 weeks.  Dr. Marlou Sa from orthopedics was also consulted by EDP suggested that it will take about 6 weeks for his the sacral fractures to heal, recommended partial weightbearing with a walker and outpatient follow-up with them.   PT and OT recommended SNF. Social work consulted and plan for discharge on Monday.  Meanwhile continue with Medrol Dosepak and oxycodone as needed with good bowel regimen.   Hypothyroidism Continue with Synthroid   Dyslipidemia continue with statin.  Constipation will start stool softeners.  DVT prophylaxis:Lovenox code Status:Full code Family Communication no family at bedside disposition Plan:Discharged to SNF on Monday   Consultants: On-call neurosurgeon and  orthopedics were called and discussed by the EDP  Procedures:None Antimicrobials:None  Estimated body mass index is 27.92 kg/m as calculated from the following:   Height as of this encounter: 5\' 6"  (1.676 m).   Weight as of this encounter: 78.5 kg.   Subjective:  Resting in bed reports having bowel movements after stool softeners were given yesterday no new complaints anxious to be discharged to SNF tomorrow Objective: Vitals:   12/26/18 0420 12/26/18 1448 12/26/18 2002 12/27/18 0346  BP: (!) 163/80 130/78 139/75 135/72  Pulse: 72 81 88 71  Resp:      Temp: 98.2 F (36.8 C) 98.6 F (37 C) 98.6 F (37 C) 98.1 F (36.7 C)  TempSrc: Oral Oral Oral Oral  SpO2: 97% 94% 95% 95%  Weight:      Height:        Intake/Output Summary (Last 24 hours) at 12/27/2018 0903 Last data filed at 12/26/2018 1700 Gross per 24 hour  Intake 960 ml  Output -  Net 960 ml   Filed Weights   12/22/18 1304  Weight: 78.5 kg    Examination:  General exam: Appears calm and comfortable  Respiratory system: Clear to auscultation. Respiratory effort normal. Cardiovascular system: S1 & S2 heard, RRR. No JVD, murmurs, rubs, gallops or clicks. No pedal edema. Gastrointestinal system: Abdomen is nondistended, soft and nontender. No organomegaly or masses felt. Normal bowel sounds heard. Central nervous system: Alert and oriented. No focal neurological deficits. Extremities: Symmetric 5 x 5 power. Skin: No rashes, lesions or ulcers Psychiatry: Judgement and insight appear normal. Mood & affect appropriate.     Data Reviewed: I have personally reviewed following labs and imaging studies  CBC: Recent Labs  Lab 12/22/18 1620  WBC 8.4  NEUTROABS 7.1  HGB 12.7  HCT 40.7  MCV 94.9  PLT 193   Basic Metabolic Panel: Recent Labs  Lab 12/22/18 1620  NA 139  K 4.0  CL 106  CO2 26  GLUCOSE 135*  BUN 10  CREATININE 0.72  CALCIUM 8.9   GFR: Estimated Creatinine Clearance: 53.4 mL/min (by  C-G formula based on SCr of 0.72 mg/dL). Liver Function Tests: No results for input(s): AST, ALT, ALKPHOS, BILITOT, PROT, ALBUMIN in the last 168 hours. No results for input(s): LIPASE, AMYLASE in the last 168 hours. No results for input(s): AMMONIA in the last 168 hours. Coagulation Profile: No results for input(s): INR, PROTIME in the last 168 hours. Cardiac Enzymes: No results for input(s): CKTOTAL, CKMB, CKMBINDEX, TROPONINI in the last 168 hours. BNP (last 3 results) No results for input(s): PROBNP in the last 8760 hours. HbA1C: No results for input(s): HGBA1C in the last 72 hours. CBG: No results for input(s): GLUCAP in the last 168 hours. Lipid Profile: No results for input(s): CHOL, HDL, LDLCALC, TRIG, CHOLHDL, LDLDIRECT in the last 72 hours. Thyroid Function Tests: No results for input(s): TSH, T4TOTAL, FREET4, T3FREE, THYROIDAB in the last 72 hours. Anemia Panel: No results for input(s): VITAMINB12, FOLATE, FERRITIN, TIBC, IRON, RETICCTPCT in the last 72 hours. Sepsis Labs: No results for input(s): PROCALCITON, LATICACIDVEN in the last 168 hours.  No results found for this or any previous visit (from the past 240 hour(s)).       Radiology Studies: No results found.      Scheduled Meds: . aspirin  81 mg Oral Daily  . atorvastatin  10 mg Oral Daily  . fesoterodine  4 mg Oral Daily  . levothyroxine  88 mcg Oral Q0600  . senna-docusate  1 tablet Oral BID  . sertraline  50 mg Oral Daily   Continuous Infusions:   LOS: 0 days     Georgette Shell, MD [facility]  If 7PM-7AM, please contact night-coverage [email or web address] Password [facility] 12/27/2018, 9:03 AM

## 2018-12-27 NOTE — Plan of Care (Signed)
  Problem: Elimination: Goal: Will not experience complications related to bowel motility Outcome: Progressing Goal: Will not experience complications related to urinary retention Outcome: Progressing   

## 2018-12-27 NOTE — Progress Notes (Signed)
Physical Therapy Treatment Patient Details Name: Patricia [name] MRN: [id number] DOB: [birth year] Today's Date: 12/27/2018    History of Present Illness Rolla Servidio is a 85 y.o. female with medical history significant of hypothyroidism, CVA who is coming to the emergency department due to progressively worse back pain and difficulty walking after having a fall on 12/05/2018. Acute T10 compression fracture, L paravertebral hematoma, chronic t12 compr fx,     PT Comments    Pt received in bed, agreeable to participation in therapy. She required min assist bed mobility, min guard assist sit to stand and min guard assist ambulation 100 feet with RW. Pt positioned in recliner with feet elevated at end of session.    Follow Up Recommendations  SNF     Equipment Recommendations  None recommended by PT    Recommendations for Other Services       Precautions / Restrictions Precautions Precautions: Back;Fall Precaution Comments: reviewed 3/3 back precautions Restrictions RLE Weight Bearing: Partial weight bearing Other Position/Activity Restrictions: per MD note - pt PWB right leg when up    Mobility  Bed Mobility Overal bed mobility: Needs Assistance   Rolling: Modified independent (Device/Increase time) Sidelying to sit: Min assist       General bed mobility comments: +rail, cues for logroll, assist to elevate trunk  Transfers Overall transfer level: Needs assistance Equipment used: Rolling walker (2 wheeled) Transfers: Sit to/from Stand Sit to Stand: Min guard;From elevated surface         General transfer comment: cues for hand placement  Ambulation/Gait Ambulation/Gait assistance: Min guard Gait Distance (Feet): 100 Feet Assistive device: Rolling walker (2 wheeled) Gait Pattern/deviations: Step-through pattern;Decreased stride length;Antalgic Gait velocity: decreased Gait velocity interpretation: <1.31 ft/sec, indicative of household Nurse, children's Rankin (Stroke Patients Only)       Balance Overall balance assessment: History of Falls                                          Cognition Arousal/Alertness: Awake/alert Behavior During Therapy: WFL for tasks assessed/performed Overall Cognitive Status: Within Functional Limits for tasks assessed Area of Impairment: Memory                     Memory: Decreased recall of precautions;Decreased short-term memory                Exercises      General Comments        Pertinent Vitals/Pain Pain Assessment: 0-10 Pain Score: 5  Pain Location: back Pain Descriptors / Indicators: Sore;Grimacing;Guarding Pain Intervention(s): Monitored during session;Repositioned    Home Living                      Prior Function            PT Goals (current goals can now be found in the care plan section) Acute Rehab PT Goals Patient Stated Goal: rehab then home PT Goal Formulation: With patient Time For Goal Achievement: 12/30/18 Potential to Achieve Goals: Good Progress towards PT goals: Progressing toward goals    Frequency    Min 5X/week      PT Plan Current plan remains appropriate    Co-evaluation  AM-PAC PT "6 Clicks" Mobility   Outcome Measure  Help needed turning from your back to your side while in a flat bed without using bedrails?: A Little Help needed moving from lying on your back to sitting on the side of a flat bed without using bedrails?: A Little Help needed moving to and from a bed to a chair (including a wheelchair)?: A Little Help needed standing up from a chair using your arms (e.g., wheelchair or bedside chair)?: A Little Help needed to walk in hospital room?: A Little Help needed climbing 3-5 steps with a railing? : A Lot 6 Click Score: 17    End of Session Equipment Utilized During Treatment: Gait belt Activity Tolerance: Patient tolerated  treatment well Patient left: in chair;with call bell/phone within reach Nurse Communication: Mobility status PT Visit Diagnosis: Unsteadiness on feet (R26.81);Difficulty in walking, not elsewhere classified (R26.2);Muscle weakness (generalized) (M62.81);Pain     Time: 1771-1657 PT Time Calculation (min) (ACUTE ONLY): 21 min  Charges:  $Gait Training: 8-22 mins                     Lorrin Goodell, [place]  Office # [phone] Pager [phone]    Lorriane Shire 12/27/2018, 2:36 PM

## 2018-12-28 DIAGNOSIS — R739 Hyperglycemia, unspecified: Secondary | ICD-10-CM

## 2018-12-28 DIAGNOSIS — S22000A Wedge compression fracture of unspecified thoracic vertebra, initial encounter for closed fracture: Secondary | ICD-10-CM

## 2018-12-28 DIAGNOSIS — M549 Dorsalgia, unspecified: Secondary | ICD-10-CM | POA: Diagnosis not present

## 2018-12-28 DIAGNOSIS — S3210XA Unspecified fracture of sacrum, initial encounter for closed fracture: Secondary | ICD-10-CM | POA: Diagnosis not present

## 2018-12-28 MED ORDER — OXYCODONE HCL 5 MG PO TABS: 5.0000 mg | ORAL_TABLET | ORAL | 0 refills | Status: AC | PRN

## 2018-12-28 NOTE — Progress Notes (Signed)
Patient discharging to [facility]. Report called in to [facility]. Discharge summary updated.  Patient left via personal car.

## 2018-12-28 NOTE — TOC Transition Note (Addendum)
Transition of Care [facility]) - CM/SW Discharge Note   Patient Details  Name: Patricia [name] MRN: [id number] Date of Birth: Feb 10, 1930  Transition of Care [facility] CM/SW Contact:  Alberteen Sam, LCSW Phone Number: 12/28/2018, 11:27 AM   Clinical Narrative:     Patient will DC [id number] Anticipated DC date: 12/28/2018 Family notified: Nurse, adult by: [name] (patients daughter) will pick her up  Per MD patient ready for DC to [facility] . RN, patient, patient's family, and facility notified of DC. Discharge Summary sent to facility. RN given number for report [phone] Room 7004. DC packet on chart. Patient's daughter [name] notified she can pickup patient when available, will probably be this afternoon around 1:00 pm.  CSW signing off.  [place], [place]    Final next level of care: [place] Barriers to Discharge: No Barriers Identified   Patient Goals and CMS Choice Patient states their goals for this hospitalization and ongoing recovery are:: to go to rehab at [facility] then go home CMS Medicare.gov Compare Post Acute Care list provided to:: Patient Represenative (must comment)([name]) Choice offered to / list presented to : Adult Children  Discharge Placement PASRR number recieved: 12/24/18            Patient chooses bed at: [facility] Patient to be transferred to facility by: patient's daughter [name] will pick her up Name of family member notified: [name] (daughter) Patient and family notified of of transfer: 12/28/18  Discharge Plan and Services   Discharge Planning Services: NA Post Acute Care Choice: [name]          DME Arranged: N/A DME Agency: NA HH Arranged: NA HH Agency: NA   Social Determinants of Health (SDOH) Interventions     Readmission Risk Interventions No flowsheet data found.

## 2018-12-28 NOTE — Discharge Summary (Addendum)
Physician Discharge Summary  Patient ID: Patricia [name] MRN: [id number] DOB/AGE: 06/24/1930 85 y.o.  Admit date: 12/22/2018 Discharge date: 12/28/2018  Admission Diagnoses:   Intractable back pain   Elevated blood pressure reading   Compression fracture of thoracic vertebra (HCC)   Recurrent falls   Hypothyroid   Hyperglycemia  Discharge Diagnoses:    Acute T10 vertebral compression fracture   Acute bilat sacral fractures   Back pain   Falls   Recurrent falls   Hypothyroid   Hyperglycemia   Discharged Condition: good  Presentation Summary: 85 year old female with history of hypothyroidism, unknown type of CVA presented with progressive back pain and difficulty walking after having a fall on 12/05/2018. She was being treated by Medrol Dosepak by her prior PCP as an outpatient. She was found to have acute T10 compression fracture with chronic T12 compression fracture, no spinal fractures but acute fractures of S2 and S3. ED provider spoke to neurosurgery on call who recommended Medrol Dosepak treatment and outpatient follow-up with neurosurgery. Orthopedics was also consulted by ED provider who recommended outpatient follow up.   Hospital Course:  Intractable back pain/ compression fractures of the thoracic and sacral vertebra/ recurrent falls On admission EDP discussed case with neurosurgeon Dr. Venetia Constable recommended Medrol Dosepak and follow-up with neurosurgery in 1 to 2 weeks.  Dr. Marlou Sa from orthopedics was also consulted by EDP suggested that it will take about 6 weeks for his the sacral fractures to heal, recommended partial weightbearing with a walker and outpatient follow-up with them. She was not formally seen in consultation by either service.  - MRI of lumbar and thoracic spine results as below - patient had PT/ OT while here, PT recommended SNF placement - had Medrol taper here from 3/17 - 3/22 - back pain was well controlled here w/ oxy IR 5 -10 mg prn - pt will be  dc'd to SNF for rehab  Hypothyroidism Continue with Synthroid   Dyslipidemia continue with statin.  Constipation started stool softeners.  Imaging >  MR THORACIC SPINE IMPRESSION Acute T10 compression fracture. Minimal retropulsion. LEFT paravertebral hematoma but no epidural hematoma or significant stenosis. Chronic T12 compression fracture, without significant stenosis or cord compression.  MR LUMBAR SPINE IMPRESSION No acute lumbar spine fracture, but acute fractures of the S2 and S3 sacral segments are observed. Sacral canal stenosis is likely due to slight retropulsion/displacement of the S3 segment. Degenerative disc disease at L4-5 and L5-S1, related to 2 mm slip at L4-5, protruding disc material, and posterior element hypertrophy. Subarticular zone narrowing at L4-5 as well as foraminal narrowing at L5-S1 potentially affects the L5 nerve roots. See discussion above. Electronically Signed   By: Staci Righter M.D.   On: 12/22/2018 16:24  Discharge Exam: Blood pressure (!) 147/73, pulse 71, temperature 98.3 F (36.8 C), temperature source Oral, resp. rate 12, height 5\' 6"  (1.676 m), weight 78.5 kg, SpO2 96 %. Alert, up in chair, very pleasant and WDWN NO jvd  Chest cta bilat Cor reg no mrg Abd soft ntnd  Ext no edema Neuro Ox 3  Disposition: Discharge disposition: 03-Skilled Nursing Facility        Allergies as of 12/28/2018      Reactions   Fosamax [alendronate Sodium] Other (See Comments)   Upset stomach      Medication List    STOP taking these medications   predniSONE 5 MG tablet Commonly known as:  DELTASONE     TAKE these medications   aspirin 81 MG  chewable tablet Chew 81 mg by mouth daily.   atorvastatin 10 MG tablet Commonly known as:  LIPITOR TAKE 1 TABLET BY MOUTH ONCE DAILY   Biotin 5000 MCG Tabs Take 1 tablet by mouth daily.   CALCIUM PO Take 1,000 mg by mouth 2 (two) times daily.   cholecalciferol 1000 units  tablet Commonly known as:  VITAMIN D Take 1,000 Units by mouth 2 (two) times daily.   denosumab 60 MG/ML Sosy injection Commonly known as:  PROLIA Inject 60 mg into the skin every 6 (six) months.   diclofenac sodium 1 % Gel Commonly known as:  VOLTAREN Apply 1 application topically 4 (four) times daily.   FISH OIL PO Take 1,200 mg by mouth daily.   FLAX SEED OIL PO Take 1,000 mg by mouth daily.   GLUCOSAMINE-CHONDROITIN PO Take 1,500 mg by mouth daily.   levothyroxine 88 MCG tablet Commonly known as:  SYNTHROID, LEVOTHROID Take 88 mcg by mouth daily before breakfast.   MELATONIN PO Take 10 mg by mouth at bedtime as needed.   multivitamin tablet Take 1 tablet by mouth daily.   oxyCODONE 5 MG immediate release tablet Commonly known as:  Oxy IR/ROXICODONE Take 1-2 tablets (5-10 mg total) by mouth every 4 (four) hours as needed for up to 7 days for moderate pain.   polyethylene glycol packet Commonly known as:  MIRALAX / GLYCOLAX Take 17 g by mouth daily as needed for mild constipation.   PROBIOTIC DAILY PO Take 1 tablet by mouth daily.   sertraline 50 MG tablet Commonly known as:  ZOLOFT Take 50 mg by mouth daily.   tolterodine 4 MG 24 hr capsule Commonly known as:  DETROL LA Take 4 mg by mouth daily.   TURMERIC PO Take 750 mg by mouth daily.   vitamin B-12 1000 MCG tablet Commonly known as:  CYANOCOBALAMIN Take 1,000 mcg by mouth daily.   vitamin C 500 MG tablet Commonly known as:  ASCORBIC ACID Take 500 mg by mouth daily.            Durable Medical Equipment  (From admission, onward)         Start     Ordered   12/23/18 1227  For home use only DME Walker rolling  Once    Question:  Patient needs a walker to treat with the following condition  Answer:  Generalized weakness   12/23/18 1227         Contact information for after-discharge care    Destination    [facility] [place] [facility] .   Service:  Skilled  Nursing Contact information: [address] [place] [phone]              Signed: Sol Blazing 12/28/2018, 1:20 PM

## 2019-02-11 ENCOUNTER — Other Ambulatory Visit: Payer: Self-pay | Admitting: Neurology

## 2019-05-05 ENCOUNTER — Inpatient Hospital Stay: Payer: Medicare Other | Attending: Hematology and Oncology

## 2019-05-05 ENCOUNTER — Other Ambulatory Visit: Payer: Self-pay | Admitting: Hematology and Oncology

## 2019-05-05 DIAGNOSIS — D472 Monoclonal gammopathy: Secondary | ICD-10-CM

## 2019-05-11 ENCOUNTER — Telehealth: Payer: Self-pay

## 2019-05-11 ENCOUNTER — Inpatient Hospital Stay: Payer: Medicare Other | Attending: Hematology and Oncology | Admitting: Hematology and Oncology

## 2019-05-11 NOTE — Telephone Encounter (Signed)
Called pt regarding recent missed appts. Obtained vm that identified pt.  lvm asking pt to return call so that we may reschedule her appts.

## 2019-05-16 ENCOUNTER — Other Ambulatory Visit: Payer: Self-pay | Admitting: Neurology

## 2019-05-19 NOTE — Telephone Encounter (Signed)
05/18/2019 Received paper request from [place] for atorvastatin. I faxed a message back to [facility] stating medication refilled May 2020 with instructions to have primary care refill going forward. Asked for them to have pt contact PCP. Received a receipt of confirmation.

## 2019-05-25 ENCOUNTER — Telehealth: Payer: Self-pay | Admitting: *Deleted

## 2019-05-25 NOTE — Telephone Encounter (Signed)
Received Atorvastatin refill request again from [facility]. Pt was released to PCP in July 2019 with temp refills of Atorvastatin until PCP could take over. Message sent to Dr. Shelia Media requesting he take over that medication.

## 2019-05-31 NOTE — Telephone Encounter (Signed)
We received another Atorvastatin refill request from [facility]. I spoke with the pt. She understands PCP should be taking over this as Dr. Jaynee Eagles released her back to PCP last year. I tried to call Dr. Pennie Banter office but it was closed. I called [place] and spoke with Dorian. He will put in a request to Dr. Pennie Banter office for the Atorvastatin refill.

## 2019-09-02 ENCOUNTER — Emergency Department (HOSPITAL_COMMUNITY): Payer: Medicare Other

## 2019-09-02 ENCOUNTER — Other Ambulatory Visit: Payer: Self-pay

## 2019-09-02 ENCOUNTER — Emergency Department (HOSPITAL_COMMUNITY)
Admission: EM | Admit: 2019-09-02 | Discharge: 2019-09-03 | Disposition: A | Discharge status: Home / Self Care | Payer: Medicare Other | Attending: Emergency Medicine | Admitting: Emergency Medicine

## 2019-09-02 ENCOUNTER — Encounter (HOSPITAL_COMMUNITY): Payer: Self-pay | Admitting: Emergency Medicine

## 2019-09-02 DIAGNOSIS — Z8673 Personal history of transient ischemic attack (TIA), and cerebral infarction without residual deficits: Secondary | ICD-10-CM | POA: Diagnosis not present

## 2019-09-02 DIAGNOSIS — R42 Dizziness and giddiness: Secondary | ICD-10-CM

## 2019-09-02 DIAGNOSIS — Z7982 Long term (current) use of aspirin: Secondary | ICD-10-CM | POA: Diagnosis not present

## 2019-09-02 DIAGNOSIS — E039 Hypothyroidism, unspecified: Secondary | ICD-10-CM | POA: Diagnosis not present

## 2019-09-02 DIAGNOSIS — Z79899 Other long term (current) drug therapy: Secondary | ICD-10-CM | POA: Diagnosis not present

## 2019-09-02 DIAGNOSIS — R410 Disorientation, unspecified: Secondary | ICD-10-CM | POA: Diagnosis not present

## 2019-09-02 LAB — COMPREHENSIVE METABOLIC PANEL
ALT: 20 U/L (ref 0–44)
AST: 23 U/L (ref 15–41)
Albumin: 3.9 g/dL (ref 3.5–5.0)
Alkaline Phosphatase: 38 U/L (ref 38–126)
Anion gap: 12 (ref 5–15)
BUN: 19 mg/dL (ref 8–23)
CO2: 25 mmol/L (ref 22–32)
Calcium: 9.5 mg/dL (ref 8.9–10.3)
Chloride: 103 mmol/L (ref 98–111)
Creatinine, Ser: 0.87 mg/dL (ref 0.44–1.00)
GFR calc Af Amer: >60 mL/min (ref >60)
GFR calc non Af Amer: 60 mL/min — ABNORMAL LOW (ref >60)
Glucose, Bld: 131 mg/dL — ABNORMAL HIGH (ref 70–99)
Potassium: 3.9 mmol/L (ref 3.5–5.1)
Sodium: 140 mmol/L (ref 135–145)
Total Bilirubin: 0.8 mg/dL (ref 0.3–1.2)
Total Protein: 7.6 g/dL (ref 6.5–8.1)

## 2019-09-02 LAB — CBC WITH DIFFERENTIAL/PLATELET
Abs Immature Granulocytes: 0.03 10*3/uL (ref 0.00–0.07)
Basophils Absolute: 0.1 10*3/uL (ref 0.0–0.1)
Basophils Relative: 1 %
Eosinophils Absolute: 0.1 10*3/uL (ref 0.0–0.5)
Eosinophils Relative: 2 %
HCT: 41 % (ref 36.0–46.0)
Hemoglobin: 13 g/dL (ref 12.0–15.0)
Immature Granulocytes: 0 %
Lymphocytes Relative: 19 %
Lymphs Abs: 1.4 10*3/uL (ref 0.7–4.0)
MCH: 31.3 pg (ref 26.0–34.0)
MCHC: 31.7 g/dL (ref 30.0–36.0)
MCV: 98.8 fL (ref 80.0–100.0)
Monocytes Absolute: 0.6 10*3/uL (ref 0.1–1.0)
Monocytes Relative: 8 %
Neutro Abs: 5.1 10*3/uL (ref 1.7–7.7)
Neutrophils Relative %: 70 %
Platelets: 251 10*3/uL (ref 150–400)
RBC: 4.15 MIL/uL (ref 3.87–5.11)
RDW: 13.2 % (ref 11.5–15.5)
WBC: 7.3 10*3/uL (ref 4.0–10.5)
nRBC: 0 % (ref 0.0–0.2)

## 2019-09-02 LAB — URINALYSIS, ROUTINE W REFLEX MICROSCOPIC
Bacteria, UA: NONE SEEN
Bilirubin Urine: NEGATIVE
Glucose, UA: NEGATIVE mg/dL
Hgb urine dipstick: NEGATIVE
Ketones, ur: NEGATIVE mg/dL
Nitrite: NEGATIVE
Protein, ur: NEGATIVE mg/dL
Specific Gravity, Urine: 1.02 (ref 1.005–1.030)
pH: 5 (ref 5.0–8.0)

## 2019-09-02 LAB — CBG MONITORING, ED: Glucose-Capillary: 115 mg/dL — ABNORMAL HIGH (ref 70–99)

## 2019-09-02 NOTE — ED Triage Notes (Signed)
Pt from home via [facility] c/o dizzness and stroke like symptoms (disorientation).  Pt states she was sitting at home playing solitaire on her phone when she began to feel disoriented. She states the numbers began to blur. She also c/o of dizziness at this time. She describes it as "everything was just foggy. Seems like I couldn't focus on anything." Lasting 10-15 minutes.   HX TIA.

## 2019-09-02 NOTE — ED Provider Notes (Signed)
[facility] EMERGENCY DEPARTMENT Provider Note   CSN: [id number] Arrival date & time: 09/02/19  2221     History   Chief Complaint No chief complaint on file.   HPI Patricia [name] is a 85 y.o. female.     HPI  Pt is an 85 year old female with past medical history of GERD, hyperlipidemia, MGUS, prior CVA/TIA who presents to the ED with concern for dizziness.  Patient reports she was in her normal state of health today.  She states she completed Thanksgiving dinner with her family with no concerns.  Patient was at home alone playing solitaire on her phone when she began to feel disoriented.  Patient states she felt dizziness and reversed things feeling blurry.  She states she felt she was in a fog for 10 to 15 minutes.  Patient reports she just cannot focus during this time.  Patient was alone and is not sure if she had speech difficulty.  She was able to call her daughter and then EMS arrived.  Patient denies any recent falls or trauma.  She denies being on any anticoagulation.  Patient states currently she does not have any new vision changes, numbness, tingling, weakness. H  Past Medical History:  Diagnosis Date  . Bladder incontinence   . Depression   . GERD (gastroesophageal reflux disease)   . High cholesterol   . Hypothyroid   . Recurrent falls   . Stroke [facility])     Patient Active Problem List   Diagnosis Date Noted  . Intractable back pain 12/22/2018  . Hypothyroid 12/22/2018  . Hyperglycemia 12/22/2018  . Recurrent falls 04/27/2018  . Small fiber neuropathy 09/03/2016  . Compression fracture of thoracic vertebra (HCC) 06/21/2016  . MGUS (monoclonal gammopathy of unknown significance) 02/16/2016  . Quality of life palliative care encounter 02/16/2016  . Elevated blood pressure reading 02/16/2016  . Polyneuropathy 01/15/2016  . Neuropathy 01/10/2016  . Occipital stroke ([place]) 02/21/2015    Past Surgical History:  Procedure Laterality Date  .  CARPAL TUNNEL RELEASE    . CATARACT EXTRACTION Bilateral   . EYE SURGERY Left 02/15/15    Laser surgery      OB History   No obstetric history on file.      Home Medications    Prior to Admission medications   Medication Sig Start Date End Date Taking? Authorizing Provider  aspirin 81 MG chewable tablet Chew 81 mg by mouth daily.      atorvastatin (LIPITOR) 10 MG tablet Take 1 tablet (10 mg total) by mouth daily. Please have primary care provide further refills. 02/11/19   Melvenia Beam, MD  Biotin 5000 MCG TABS Take 1 tablet by mouth daily.    [provider]  CALCIUM PO Take 1,000 mg by mouth 2 (two) times daily.     [provider]  cholecalciferol (VITAMIN D) 1000 units tablet Take 1,000 Units by mouth 2 (two) times daily.     [provider]  denosumab (PROLIA) 60 MG/ML SOSY injection Inject 60 mg into the skin every 6 (six) months.    [provider]  diclofenac sodium (VOLTAREN) 1 % GEL Apply 1 application topically 4 (four) times daily. 12/18/18   [provider]  Flaxseed, Linseed, (FLAX SEED OIL PO) Take 1,000 mg by mouth daily.    [provider]  GLUCOSAMINE-CHONDROITIN PO Take 1,500 mg by mouth daily.    [provider]  levothyroxine (SYNTHROID, LEVOTHROID) 88 MCG tablet Take 88  mcg by mouth daily before breakfast.    [provider]  MELATONIN PO Take 10 mg by mouth at bedtime as needed.     [provider]  Multiple Vitamin (MULTIVITAMIN) tablet Take 1 tablet by mouth daily.    [provider]  Omega-3 Fatty Acids (FISH OIL PO) Take 1,200 mg by mouth daily.    [provider]  polyethylene glycol (MIRALAX / GLYCOLAX) packet Take 17 g by mouth daily as needed for mild constipation.     [provider]  Probiotic Product (PROBIOTIC DAILY PO) Take 1 tablet by mouth daily.    [provider]  sertraline (ZOLOFT) 50 MG tablet Take 50 mg by mouth daily.      [provider]  tolterodine (DETROL LA) 4 MG 24 hr capsule Take 4 mg by mouth daily. 12/13/18   [provider]  TURMERIC PO Take 750 mg by mouth daily.    [provider]  vitamin B-12 (CYANOCOBALAMIN) 1000 MCG tablet Take 1,000 mcg by mouth daily.    [provider]  vitamin C (ASCORBIC ACID) 500 MG tablet Take 500 mg by mouth daily.    [provider]    Family History Family History  Problem Relation Age of Onset  . Heart disease Mother   . Throat cancer Maternal Aunt   . Cancer Maternal Aunt        breast ca  . Lupus Daughter   . Anuerysm Daughter        Brain    Social History Social History   Tobacco Use  . Smoking status: Never Smoker  . Smokeless tobacco: Never Used  Substance Use Topics  . Alcohol use: Yes    Alcohol/week: 0.0 standard drinks    Comment: Very rarley   . Drug use: No     Allergies   Fosamax [alendronate sodium]   Review of Systems Review of Systems  Constitutional: Negative for chills and fever.  HENT: Negative for sore throat.   Eyes: Negative for pain and visual disturbance.  Respiratory: Negative for cough and shortness of breath.   Cardiovascular: Negative for chest pain and palpitations.  Gastrointestinal: Negative for abdominal pain and vomiting.  Genitourinary: Negative for dysuria and hematuria.  Musculoskeletal: Negative for arthralgias and back pain.  Skin: Negative for color change and rash.  Neurological: Positive for dizziness. Negative for seizures and syncope.  Psychiatric/Behavioral: Positive for confusion.  All other systems reviewed and are negative.    Physical Exam Updated Vital Signs BP (!) 144/90   Pulse 72   Temp 98.7 F (37.1 C) (Oral)   Resp 15   SpO2 98%   Physical Exam Vitals signs and nursing note reviewed.  Constitutional:      General: She is not in acute distress.    Appearance: She is well-developed.  HENT:     Head: Normocephalic and atraumatic.      Mouth/Throat:     Mouth: Mucous membranes are moist.     Pharynx: Oropharynx is clear.  Eyes:     Extraocular Movements: Extraocular movements intact.     Conjunctiva/sclera: Conjunctivae normal.     Pupils: Pupils are equal, round, and reactive to light.  Neck:     Musculoskeletal: Normal range of motion and neck supple.  Cardiovascular:     Rate and Rhythm: Normal rate and regular rhythm.     Heart sounds: No murmur.  Pulmonary:     Effort: Pulmonary effort is normal. No respiratory  distress.     Breath sounds: Normal breath sounds.  Abdominal:     Palpations: Abdomen is soft.     Tenderness: There is no abdominal tenderness.  Skin:    General: Skin is warm and dry.  Neurological:     General: No focal deficit present.     Mental Status: She is alert.     Cranial Nerves: No cranial nerve deficit.     Sensory: No sensory deficit.     Motor: No weakness.     Coordination: Coordination normal.     Gait: Gait normal.     Comments: Mild difficulty with R lateral superior visual field, reports she does not think this is new, otherwise visual fields intact Finger to nose intact 5/5 strength throughout No facial asymmetry No dysarthria Gross sensation intact   Psychiatric:        Mood and Affect: Mood normal.        Behavior: Behavior normal.      ED Treatments / Results  Labs (all labs ordered are listed, but only abnormal results are displayed) Labs Reviewed  COMPREHENSIVE METABOLIC PANEL - Abnormal; Notable for the following components:      Result Value   Glucose, Bld 131 (*)    GFR calc non Af Amer 60 (*)    All other components within normal limits  URINALYSIS, ROUTINE W REFLEX MICROSCOPIC - Abnormal; Notable for the following components:   Leukocytes,Ua TRACE (*)    All other components within normal limits  CBG MONITORING, ED - Abnormal; Notable for the following components:   Glucose-Capillary 115 (*)    All other components within normal limits  CBC  WITH DIFFERENTIAL/PLATELET    EKG EKG Interpretation  Date/Time:  Thursday September 02 2019 22:28:13 EST Ventricular Rate:  67 PR Interval:    QRS Duration: 121 QT Interval:  415 QTC Calculation: 439 R Axis:   28 Text Interpretation: Sinus rhythm Nonspecific intraventricular conduction delay Confirmed by Quintella Reichert [phone]) on 09/02/2019 11:50:34 PM   Radiology Mr Brain Wo Contrast  Result Date: 09/02/2019 CLINICAL DATA:  Blurry vision. EXAM: MRI HEAD WITHOUT CONTRAST TECHNIQUE: Multiplanar, multiecho pulse sequences of the brain and surrounding structures were obtained without intravenous contrast. COMPARISON:  Brain MRI 05/04/2018 FINDINGS: BRAIN: There is no acute infarct, acute hemorrhage or extra-axial collection. Multifocal white matter hyperintensity, most commonly due to chronic ischemic microangiopathy. There is an old left occipital infarct, unchanged. There is an old left cerebellar infarct that is new since 05/04/2018. The cerebral and cerebellar volume are age-appropriate. There is no hydrocephalus. The midline structures are normal. VASCULAR: The major intracranial arterial and venous sinus flow voids are normal. Susceptibility-sensitive sequences show no chronic microhemorrhage or superficial siderosis. SKULL AND UPPER CERVICAL SPINE: Calvarial bone marrow signal is normal. There is no skull base mass. The visualized upper cervical spine and soft tissues are normal. SINUSES/ORBITS: There are no fluid levels or advanced mucosal thickening. The mastoid air cells and middle ear cavities are free of fluid. The orbits are normal. IMPRESSION: 1. No acute intracranial abnormality. 2. Chronic small vessel ischemia with old infarcts of the left occipital lobe and left cerebellum. Electronically Signed   By: Ulyses Jarred M.D.   On: 09/02/2019 23:46    Procedures Procedures (including critical care time)  Medications Ordered in ED Medications - No data to display   Initial  Impression / Assessment and Plan / ED Course  I have reviewed the triage vital signs and the nursing notes.  Pertinent labs & imaging results that were available during my care of the patient were reviewed by me and considered in my medical decision making (see chart for details).        On arrival, pt is afebrile, HDS, hypertensive. Alert and oriented. No gross neuro deficits. Glucose 115 on arrival  Considered: TIA, CVA, peripheral vertigo, brief altered mental status with resolution Patient is very well-appearing on examination and currently at baseline. Patient does have difficulty with R eye superior lateral visual fields but states she thinks this is chronic from prior TIA/CVA  MR brain:  Chronic small vessel ischemia with old infarcts of left occipital lobe and left cerebellum; when compared to MR brain in 2016 there is area of small focus of left occipital lobe representing small chronic stroke Of note, given area of left occipital lobe old infarcts, favor right eye superior lateral visual field defect secondary and chronic as patient had noted.  EKG: NSR, no obvious ischemic or arrhythmic changes  UA wnl CBC, CMP unremarkable  Per nursing, patient has ambulated without difficulty.  Upon reassessment, patient continues to be well-appearing without acute symptoms.  Overall, unexplained episodic dizziness. No chest pain/palpitations/EKG changes to suggest cardiac in nature. HTN noted, no formal history of diagnosis of HTN. Encouraged to discuss with PCP; may be anxiety related but pt endorses she will talk with her doctor regarding it.  Given patient's overall well appearance and that she is at her current baseline, feel patient is stable for discharge with close PCP follow-up.  Strict return precautions given.    Final Clinical Impressions(s) / ED Diagnoses   Final diagnoses:  Dizziness  Confusion    ED Discharge Orders    None       Burns Spain, MD 09/03/19 Bradd Burner, MD 09/04/19 1234

## 2019-09-02 NOTE — ED Notes (Signed)
Patient transported to MRI 

## 2019-09-02 NOTE — ED Notes (Signed)
ED Provider at bedside. 

## 2019-09-02 NOTE — ED Notes (Signed)
Pt ambulated to bathroom with RN. Pt gait stable. Pt denies dizziness.

## 2019-09-03 NOTE — ED Notes (Signed)
Discharge instructions discussed with pt. pt verbalized understanding with no questions at this time. Pt to go home with daughter at bedside

## 2019-10-20 ENCOUNTER — Ambulatory Visit: Payer: Medicare PPO | Attending: Internal Medicine

## 2019-10-20 DIAGNOSIS — Z20822 Contact with and (suspected) exposure to covid-19: Secondary | ICD-10-CM

## 2019-10-21 LAB — NOVEL CORONAVIRUS, NAA: SARS-CoV-2, NAA: NOT DETECTED

## 2019-10-26 DIAGNOSIS — I6523 Occlusion and stenosis of bilateral carotid arteries: Secondary | ICD-10-CM | POA: Diagnosis not present

## 2019-10-26 DIAGNOSIS — Z8701 Personal history of pneumonia (recurrent): Secondary | ICD-10-CM | POA: Diagnosis not present

## 2019-10-26 DIAGNOSIS — K9289 Other specified diseases of the digestive system: Secondary | ICD-10-CM | POA: Diagnosis not present

## 2019-10-26 DIAGNOSIS — R05 Cough: Secondary | ICD-10-CM | POA: Diagnosis not present

## 2019-10-27 DIAGNOSIS — R05 Cough: Secondary | ICD-10-CM | POA: Diagnosis not present

## 2019-11-18 DIAGNOSIS — Z0001 Encounter for general adult medical examination with abnormal findings: Secondary | ICD-10-CM | POA: Diagnosis not present

## 2019-11-18 DIAGNOSIS — E039 Hypothyroidism, unspecified: Secondary | ICD-10-CM | POA: Diagnosis not present

## 2019-11-18 DIAGNOSIS — M810 Age-related osteoporosis without current pathological fracture: Secondary | ICD-10-CM | POA: Diagnosis not present

## 2019-12-29 DIAGNOSIS — M810 Age-related osteoporosis without current pathological fracture: Secondary | ICD-10-CM | POA: Diagnosis not present

## 2020-01-03 DIAGNOSIS — H5203 Hypermetropia, bilateral: Secondary | ICD-10-CM | POA: Diagnosis not present

## 2020-01-03 DIAGNOSIS — H52203 Unspecified astigmatism, bilateral: Secondary | ICD-10-CM | POA: Diagnosis not present

## 2020-01-03 DIAGNOSIS — Z961 Presence of intraocular lens: Secondary | ICD-10-CM | POA: Diagnosis not present

## 2020-01-21 ENCOUNTER — Telehealth: Payer: Self-pay | Admitting: Neurology

## 2020-01-21 NOTE — Telephone Encounter (Signed)
Pt's daughter Tomi Bamberger called stating that the pt is about to switch the POA to her and they are needing a letter stating that the pt does not have Alzheimer or dementia and that she is of sound mind. Please advise.

## 2020-01-24 ENCOUNTER — Encounter: Payer: Self-pay | Admitting: *Deleted

## 2020-01-24 DIAGNOSIS — Z0000 Encounter for general adult medical examination without abnormal findings: Secondary | ICD-10-CM | POA: Diagnosis not present

## 2020-01-24 DIAGNOSIS — M810 Age-related osteoporosis without current pathological fracture: Secondary | ICD-10-CM | POA: Diagnosis not present

## 2020-01-24 DIAGNOSIS — R32 Unspecified urinary incontinence: Secondary | ICD-10-CM | POA: Diagnosis not present

## 2020-01-24 DIAGNOSIS — D472 Monoclonal gammopathy: Secondary | ICD-10-CM | POA: Diagnosis not present

## 2020-01-24 DIAGNOSIS — F411 Generalized anxiety disorder: Secondary | ICD-10-CM | POA: Diagnosis not present

## 2020-01-24 DIAGNOSIS — S22000S Wedge compression fracture of unspecified thoracic vertebra, sequela: Secondary | ICD-10-CM | POA: Diagnosis not present

## 2020-01-24 DIAGNOSIS — I6523 Occlusion and stenosis of bilateral carotid arteries: Secondary | ICD-10-CM | POA: Diagnosis not present

## 2020-01-24 DIAGNOSIS — E039 Hypothyroidism, unspecified: Secondary | ICD-10-CM | POA: Diagnosis not present

## 2020-01-24 DIAGNOSIS — G629 Polyneuropathy, unspecified: Secondary | ICD-10-CM | POA: Diagnosis not present

## 2020-01-24 NOTE — Telephone Encounter (Signed)
Dr. Jaynee Eagles reviewed and signed letter. I called pt's daughter Tomi Bamberger @ [phone] and LVM asking for call back. When she calls back, please let her know the letter has been written. It is accessible through pt's mychart. Alternatively she is welcome to pick up the signed copy from the office or we can mail it to her. Please ask her preference and let me know.

## 2020-01-24 NOTE — Telephone Encounter (Signed)
That's fine do you have time to write it up? You can write exactly that, she is competent to make this decision

## 2020-01-24 NOTE — Telephone Encounter (Signed)
Letter written, pending Dr. Cathren Laine review and signature.

## 2020-01-24 NOTE — Telephone Encounter (Signed)
Letter placed at front desk for pick up

## 2020-01-24 NOTE — Telephone Encounter (Signed)
Pt daughter Tomi Bamberger called back in regards to missed call informed message below and states she will stop by to pickup letter today

## 2020-02-08 DIAGNOSIS — R609 Edema, unspecified: Secondary | ICD-10-CM | POA: Diagnosis not present

## 2020-04-11 DIAGNOSIS — Z1231 Encounter for screening mammogram for malignant neoplasm of breast: Secondary | ICD-10-CM | POA: Diagnosis not present

## 2020-04-18 ENCOUNTER — Encounter (HOSPITAL_BASED_OUTPATIENT_CLINIC_OR_DEPARTMENT_OTHER): Payer: Self-pay | Admitting: *Deleted

## 2020-04-18 ENCOUNTER — Emergency Department (HOSPITAL_BASED_OUTPATIENT_CLINIC_OR_DEPARTMENT_OTHER): Payer: Medicare PPO

## 2020-04-18 ENCOUNTER — Other Ambulatory Visit: Payer: Self-pay

## 2020-04-18 ENCOUNTER — Emergency Department (HOSPITAL_BASED_OUTPATIENT_CLINIC_OR_DEPARTMENT_OTHER)
Admission: EM | Admit: 2020-04-18 | Discharge: 2020-04-18 | Disposition: A | Discharge status: Home / Self Care | Payer: Medicare PPO | Attending: Emergency Medicine | Admitting: Emergency Medicine

## 2020-04-18 DIAGNOSIS — Z20822 Contact with and (suspected) exposure to covid-19: Secondary | ICD-10-CM | POA: Insufficient documentation

## 2020-04-18 DIAGNOSIS — G629 Polyneuropathy, unspecified: Secondary | ICD-10-CM | POA: Diagnosis not present

## 2020-04-18 DIAGNOSIS — Z7982 Long term (current) use of aspirin: Secondary | ICD-10-CM | POA: Diagnosis not present

## 2020-04-18 DIAGNOSIS — E039 Hypothyroidism, unspecified: Secondary | ICD-10-CM | POA: Insufficient documentation

## 2020-04-18 DIAGNOSIS — Z79899 Other long term (current) drug therapy: Secondary | ICD-10-CM | POA: Diagnosis not present

## 2020-04-18 DIAGNOSIS — R9431 Abnormal electrocardiogram [ECG] [EKG]: Secondary | ICD-10-CM | POA: Diagnosis not present

## 2020-04-18 DIAGNOSIS — R531 Weakness: Secondary | ICD-10-CM | POA: Insufficient documentation

## 2020-04-18 DIAGNOSIS — N390 Urinary tract infection, site not specified: Secondary | ICD-10-CM | POA: Diagnosis not present

## 2020-04-18 DIAGNOSIS — M79601 Pain in right arm: Secondary | ICD-10-CM | POA: Insufficient documentation

## 2020-04-18 DIAGNOSIS — M79602 Pain in left arm: Secondary | ICD-10-CM | POA: Diagnosis not present

## 2020-04-18 DIAGNOSIS — Z791 Long term (current) use of non-steroidal anti-inflammatories (NSAID): Secondary | ICD-10-CM | POA: Diagnosis not present

## 2020-04-18 DIAGNOSIS — I6782 Cerebral ischemia: Secondary | ICD-10-CM | POA: Diagnosis not present

## 2020-04-18 DIAGNOSIS — I77810 Thoracic aortic ectasia: Secondary | ICD-10-CM | POA: Diagnosis not present

## 2020-04-18 DIAGNOSIS — M791 Myalgia, unspecified site: Secondary | ICD-10-CM | POA: Insufficient documentation

## 2020-04-18 DIAGNOSIS — I700 Atherosclerosis of aorta: Secondary | ICD-10-CM | POA: Diagnosis not present

## 2020-04-18 DIAGNOSIS — A419 Sepsis, unspecified organism: Secondary | ICD-10-CM | POA: Diagnosis not present

## 2020-04-18 DIAGNOSIS — K219 Gastro-esophageal reflux disease without esophagitis: Secondary | ICD-10-CM | POA: Insufficient documentation

## 2020-04-18 DIAGNOSIS — Z8673 Personal history of transient ischemic attack (TIA), and cerebral infarction without residual deficits: Secondary | ICD-10-CM | POA: Diagnosis not present

## 2020-04-18 DIAGNOSIS — R509 Fever, unspecified: Secondary | ICD-10-CM | POA: Insufficient documentation

## 2020-04-18 DIAGNOSIS — R5383 Other fatigue: Secondary | ICD-10-CM | POA: Diagnosis not present

## 2020-04-18 LAB — URINALYSIS, ROUTINE W REFLEX MICROSCOPIC
Bilirubin Urine: NEGATIVE
Glucose, UA: NEGATIVE mg/dL
Hgb urine dipstick: NEGATIVE
Ketones, ur: NEGATIVE mg/dL
Nitrite: NEGATIVE
Protein, ur: NEGATIVE mg/dL
Specific Gravity, Urine: >1.03 — ABNORMAL HIGH (ref 1.005–1.030)
pH: 5.5 (ref 5.0–8.0)

## 2020-04-18 LAB — CBC WITH DIFFERENTIAL/PLATELET
Abs Immature Granulocytes: 0.15 10*3/uL — ABNORMAL HIGH (ref 0.00–0.07)
Basophils Absolute: 0.1 10*3/uL (ref 0.0–0.1)
Basophils Relative: 0 %
Eosinophils Absolute: 0 10*3/uL (ref 0.0–0.5)
Eosinophils Relative: 0 %
HCT: 42.6 % (ref 36.0–46.0)
Hemoglobin: 13.7 g/dL (ref 12.0–15.0)
Immature Granulocytes: 1 %
Lymphocytes Relative: 2 %
Lymphs Abs: 0.4 10*3/uL — ABNORMAL LOW (ref 0.7–4.0)
MCH: 31.1 pg (ref 26.0–34.0)
MCHC: 32.2 g/dL (ref 30.0–36.0)
MCV: 96.8 fL (ref 80.0–100.0)
Monocytes Absolute: 0.8 10*3/uL (ref 0.1–1.0)
Monocytes Relative: 5 %
Neutro Abs: 15.6 10*3/uL — ABNORMAL HIGH (ref 1.7–7.7)
Neutrophils Relative %: 92 %
Platelets: 251 10*3/uL (ref 150–400)
RBC: 4.4 MIL/uL (ref 3.87–5.11)
RDW: 13.4 % (ref 11.5–15.5)
WBC: 17.1 10*3/uL — ABNORMAL HIGH (ref 4.0–10.5)
nRBC: 0 % (ref 0.0–0.2)

## 2020-04-18 LAB — URINALYSIS, MICROSCOPIC (REFLEX): RBC / HPF: NONE SEEN RBC/hpf (ref 0–5)

## 2020-04-18 LAB — COMPREHENSIVE METABOLIC PANEL
ALT: 21 U/L (ref 0–44)
AST: 23 U/L (ref 15–41)
Albumin: 4.3 g/dL (ref 3.5–5.0)
Alkaline Phosphatase: 48 U/L (ref 38–126)
Anion gap: 13 (ref 5–15)
BUN: 14 mg/dL (ref 8–23)
CO2: 24 mmol/L (ref 22–32)
Calcium: 9.2 mg/dL (ref 8.9–10.3)
Chloride: 99 mmol/L (ref 98–111)
Creatinine, Ser: 0.8 mg/dL (ref 0.44–1.00)
GFR calc Af Amer: >60 mL/min (ref >60)
GFR calc non Af Amer: >60 mL/min (ref >60)
Glucose, Bld: 147 mg/dL — ABNORMAL HIGH (ref 70–99)
Potassium: 3.6 mmol/L (ref 3.5–5.1)
Sodium: 136 mmol/L (ref 135–145)
Total Bilirubin: 0.8 mg/dL (ref 0.3–1.2)
Total Protein: 8.4 g/dL — ABNORMAL HIGH (ref 6.5–8.1)

## 2020-04-18 LAB — LACTIC ACID, PLASMA
Lactic Acid, Venous: 1.8 mmol/L (ref 0.5–1.9)
Lactic Acid, Venous: 1.8 mmol/L (ref 0.5–1.9)

## 2020-04-18 LAB — APTT: aPTT: 36 seconds (ref 24–36)

## 2020-04-18 LAB — PROTIME-INR
INR: 1.1 (ref 0.8–1.2)
Prothrombin Time: 13.4 seconds (ref 11.4–15.2)

## 2020-04-18 LAB — SARS CORONAVIRUS 2 BY RT PCR (HOSPITAL ORDER, PERFORMED IN ~~LOC~~ HOSPITAL LAB): SARS Coronavirus 2: NEGATIVE

## 2020-04-18 MED ORDER — METRONIDAZOLE IN NACL 5-0.79 MG/ML-% IV SOLN
500.0000 mg | Freq: Once | INTRAVENOUS | Status: AC
  Administered 2020-04-18: 500 mg via INTRAVENOUS
  Filled 2020-04-18: qty 100

## 2020-04-18 MED ORDER — SODIUM CHLORIDE 0.9 % IV SOLN
2.0000 g | Freq: Once | INTRAVENOUS | Status: AC
  Administered 2020-04-18: 2 g via INTRAVENOUS
  Filled 2020-04-18: qty 2

## 2020-04-18 MED ORDER — SODIUM CHLORIDE 0.9 % IV SOLN: 2.0000 g | Freq: Two times a day (BID) | INTRAVENOUS | Status: DC

## 2020-04-18 MED ORDER — ACETAMINOPHEN 325 MG PO TABS
650.0000 mg | ORAL_TABLET | Freq: Once | ORAL | Status: AC
  Administered 2020-04-18: 650 mg via ORAL
  Filled 2020-04-18: qty 2

## 2020-04-18 MED ORDER — VANCOMYCIN HCL 750 MG/150ML IV SOLN
750.0000 mg | Freq: Two times a day (BID) | INTRAVENOUS | Status: DC
  Filled 2020-04-18: qty 150

## 2020-04-18 MED ORDER — CEPHALEXIN 250 MG PO CAPS
500.0000 mg | ORAL_CAPSULE | Freq: Four times a day (QID) | ORAL | 0 refills | Status: AC
Start: 2020-04-18 — End: 2020-04-25

## 2020-04-18 MED ORDER — VANCOMYCIN HCL IN DEXTROSE 1-5 GM/200ML-% IV SOLN
1000.0000 mg | Freq: Once | INTRAVENOUS | Status: AC
  Administered 2020-04-18: 1000 mg via INTRAVENOUS
  Filled 2020-04-18: qty 200

## 2020-04-18 NOTE — Discharge Instructions (Signed)
Take antibiotics as prescribed.  Take the entire course, even if your symptoms improve. Make sure you are staying well-hydrated water. Use Tylenol or ibuprofen as needed for fever or body aches. Follow-up with your primary care doctor for recheck of your symptoms. Return to the emergency room if you develop persistent high fevers, confusion, persistent vomiting, severe worsening abdominal pain, cough, difficulty breathing, or any new, worsening, or concerning symptoms.

## 2020-04-18 NOTE — ED Notes (Signed)
ED Provider at bedside. 

## 2020-04-18 NOTE — Progress Notes (Signed)
Notified bedside nurse of need to administer antibiotics.  

## 2020-04-18 NOTE — ED Provider Notes (Signed)
[facility] EMERGENCY DEPARTMENT Provider Note   CSN: [id number] Arrival date & time: 04/18/20  1657     History No chief complaint on file.   Patricia [name] is a 86 y.o. female presenting for evaluation of fever and weakness.  Patient states he started to feel poorly yesterday. However today, she felt significantly worse. She reports feeling extremely tired and weak. She had pain in both arms. Her daughter, she was clammy and warm. Daughter is concerned she may be slumping to her left side. Patient denies headache, vision changes, slurred speech, weakness on the left side, chest pain, shortness breath, cough, nausea, vomiting, Donnell pain, urinary symptoms, abnormal bowel movements. She has not taken anything for her symptoms, was unaware that she had a fever. She denies sick contacts. She did receive both Covid vaccines, however multiple of her family members have not, they have visited recently. She reports a history of previous stroke with no residual deficits, hyperlipidemia, GERD, and depression. She is not on immunosuppression.  HPI     Past Medical History:  Diagnosis Date  . Bladder incontinence   . Depression   . GERD (gastroesophageal reflux disease)   . High cholesterol   . Hypothyroid   . Recurrent falls   . Stroke [facility])     Patient Active Problem List   Diagnosis Date Noted  . Intractable back pain 12/22/2018  . Hypothyroid 12/22/2018  . Hyperglycemia 12/22/2018  . Recurrent falls 04/27/2018  . Small fiber neuropathy 09/03/2016  . Compression fracture of thoracic vertebra (HCC) 06/21/2016  . MGUS (monoclonal gammopathy of unknown significance) 02/16/2016  . Quality of life palliative care encounter 02/16/2016  . Elevated blood pressure reading 02/16/2016  . Polyneuropathy 01/15/2016  . Neuropathy 01/10/2016  . Occipital stroke ([employer]) 02/21/2015    Past Surgical History:  Procedure Laterality Date  . CARPAL TUNNEL RELEASE    . CATARACT  EXTRACTION Bilateral   . EYE SURGERY Left 02/15/15    Laser surgery      OB History   No obstetric history on file.     Family History  Problem Relation Age of Onset  . Heart disease Mother   . Throat cancer Maternal Aunt   . Cancer Maternal Aunt        breast ca  . Lupus Daughter   . Anuerysm Daughter        Brain    Social History   Tobacco Use  . Smoking status: Never Smoker  . Smokeless tobacco: Never Used  Vaping Use  . Vaping Use: Never used  Substance Use Topics  . Alcohol use: Yes    Alcohol/week: 0.0 standard drinks    Comment: Very rarley   . Drug use: No    Home Medications Prior to Admission medications   Medication Sig Start Date End Date Taking? Authorizing Provider  aspirin 81 MG chewable tablet Chew 81 mg by mouth daily.   Yes   atorvastatin (LIPITOR) 10 MG tablet Take 1 tablet (10 mg total) by mouth daily. Please have primary care provide further refills. 02/11/19  Yes Melvenia Beam, MD  Biotin 5000 MCG TABS Take 1 tablet by mouth daily.   Yes [provider]  CALCIUM PO Take 1,000 mg by mouth 2 (two) times daily.    Yes [provider]  cholecalciferol (VITAMIN D) 1000 units tablet Take 1,000 Units by mouth 2 (two) times daily.    Yes [provider]  denosumab (PROLIA) 60 MG/ML SOSY injection  Inject 60 mg into the skin every 6 (six) months.   Yes [provider]  diclofenac sodium (VOLTAREN) 1 % GEL Apply 1 application topically 4 (four) times daily. 12/18/18  Yes [provider]  Flaxseed, Linseed, (FLAX SEED OIL PO) Take 1,000 mg by mouth daily.   Yes [provider]  GLUCOSAMINE-CHONDROITIN PO Take 1,500 mg by mouth daily.   Yes [provider]  levothyroxine (SYNTHROID, LEVOTHROID) 88 MCG tablet Take 88 mcg by mouth daily before breakfast.   Yes [provider]  MELATONIN PO Take 10 mg by mouth at bedtime as needed.    Yes [provider]  Multiple Vitamin  (MULTIVITAMIN) tablet Take 1 tablet by mouth daily.   Yes [provider]  Omega-3 Fatty Acids (FISH OIL PO) Take 1,200 mg by mouth daily.   Yes [provider]  polyethylene glycol (MIRALAX / GLYCOLAX) packet Take 17 g by mouth daily as needed for mild constipation.    Yes [provider]  Probiotic Product (PROBIOTIC DAILY PO) Take 1 tablet by mouth daily.   Yes [provider]  sertraline (ZOLOFT) 50 MG tablet Take 50 mg by mouth daily.    Yes [provider]  tolterodine (DETROL LA) 4 MG 24 hr capsule Take 4 mg by mouth daily. 12/13/18  Yes [provider]  TURMERIC PO Take 750 mg by mouth daily.   Yes [provider]  vitamin B-12 (CYANOCOBALAMIN) 1000 MCG tablet Take 1,000 mcg by mouth daily.   Yes [provider]  vitamin C (ASCORBIC ACID) 500 MG tablet Take 500 mg by mouth daily.   Yes [provider]  cephALEXin (KEFLEX) 250 MG capsule Take 2 capsules (500 mg total) by mouth 4 (four) times daily for 7 days. 04/18/20 04/25/20  Cylie Dor, PA-C    Allergies    Fosamax [alendronate sodium]  Review of Systems   Review of Systems  Constitutional: Positive for fever.  Musculoskeletal: Positive for myalgias.  Neurological: Positive for weakness.  All other systems reviewed and are negative.   Physical Exam Updated Vital Signs BP 120/68   Pulse 77   Temp 99.4 F (37.4 C) (Oral)   Resp 14   Ht 5\' 6"  (1.676 m)   Wt 73.9 kg   SpO2 97%   BMI 26.31 kg/m   Physical Exam Vitals and nursing note reviewed.  Constitutional:      General: She is not in acute distress.    Appearance: She is well-developed.     Comments: Sitting in the bed in no acute distress  HENT:     Head: Normocephalic and atraumatic.  Eyes:     Conjunctiva/sclera: Conjunctivae normal.     Pupils: Pupils are equal, round, and reactive to light.  Cardiovascular:     Rate and Rhythm: Normal rate and regular rhythm.     Pulses:  Normal pulses.     Comments: Patient is not tachycardic on my exam Pulmonary:     Effort: Pulmonary effort is normal. No respiratory distress.     Breath sounds: Normal breath sounds. No wheezing.     Comments: Clear lung sounds in all fields. Speaking full sentences. Sats stable on room air. Abdominal:     General: There is no distension.     Palpations: Abdomen is soft. There is no mass.     Tenderness: There is no abdominal tenderness. There is no guarding or rebound.     Comments: No tenderness palpation of the  abdomen. No rigidity, guarding, distention. Negative rebound. No peritonitis.  Musculoskeletal:        General: Normal range of motion.     Cervical back: Normal range of motion and neck supple.  Skin:    General: Skin is warm and dry.     Capillary Refill: Capillary refill takes less than 2 seconds.  Neurological:     General: No focal deficit present.     Mental Status: She is alert and oriented to person, place, and time.     GCS: GCS eye subscore is 4. GCS verbal subscore is 5. GCS motor subscore is 6.     Cranial Nerves: Cranial nerves are intact.     Sensory: Sensation is intact.     Motor: Motor function is intact.     Coordination: Coordination is intact.     Comments: CN intact. Nose to finger intact. Fine movement and coordination intact.     ED Results / Procedures / Treatments   Labs (all labs ordered are listed, but only abnormal results are displayed) Labs Reviewed  COMPREHENSIVE METABOLIC PANEL - Abnormal; Notable for the following components:      Result Value   Glucose, Bld 147 (*)    Total Protein 8.4 (*)    All other components within normal limits  CBC WITH DIFFERENTIAL/PLATELET - Abnormal; Notable for the following components:   WBC 17.1 (*)    Neutro Abs 15.6 (*)    Lymphs Abs 0.4 (*)    Abs Immature Granulocytes 0.15 (*)    All other components within normal limits  URINALYSIS, ROUTINE W REFLEX MICROSCOPIC - Abnormal; Notable for the  following components:   Specific Gravity, Urine >1.030 (*)    Leukocytes,Ua SMALL (*)    All other components within normal limits  URINALYSIS, MICROSCOPIC (REFLEX) - Abnormal; Notable for the following components:   Bacteria, UA RARE (*)    All other components within normal limits  SARS CORONAVIRUS 2 BY RT PCR (HOSPITAL ORDER, [place] [facility])  CULTURE, BLOOD (ROUTINE X 2)  CULTURE, BLOOD (ROUTINE X 2)  URINE CULTURE  LACTIC ACID, PLASMA  LACTIC ACID, PLASMA  PROTIME-INR  APTT    EKG None  Radiology DG Chest 2 View  Result Date: 04/18/2020 CLINICAL DATA:  Sepsis, fatigue, upper extremity pain EXAM: CHEST - 2 VIEW COMPARISON:  10/26/2019 FINDINGS: Frontal and lateral views of the chest demonstrate a stable cardiac silhouette. Stable atherosclerosis and thoracic aortic ectasia. No acute airspace disease, effusion, or pneumothorax. Stable compression deformities mid to lower thoracic spine. No acute bony abnormalities. IMPRESSION: 1. Stable exam, no acute process. Electronically Signed   By: Randa Ngo M.D.   On: 04/18/2020 18:23   CT Head Wo Contrast  Result Date: 04/18/2020 CLINICAL DATA:  Fatigue, upper extremity pain, leaning to the left EXAM: CT HEAD WITHOUT CONTRAST TECHNIQUE: Contiguous axial images were obtained from the base of the skull through the vertex without intravenous contrast. COMPARISON:  09/02/2019 FINDINGS: Brain: There are chronic small-vessel ischemic changes throughout the periventricular white matter, stable. No sign of acute infarct or hemorrhage. Lateral ventricles and remaining midline structures are unremarkable. No acute extra-axial fluid collections. No mass effect. Vascular: No hyperdense vessel or unexpected calcification. Skull: Normal. Negative for fracture or focal lesion. Sinuses/Orbits: No acute finding. Other: None. IMPRESSION: 1. No acute intracranial process. Electronically Signed   By: Randa Ngo M.D.   On: 04/18/2020  18:28    Procedures Procedures (including critical care time)  Medications Ordered in ED Medications  ceFEPIme (MAXIPIME) 2 g in sodium chloride 0.9 % 100 mL IVPB (has no administration in time range)  vancomycin (VANCOREADY) IVPB 750 mg/150 mL (has no administration in time range)  acetaminophen (TYLENOL) tablet 650 mg (650 mg Oral Given 04/18/20 1740)  ceFEPIme (MAXIPIME) 2 g in sodium chloride 0.9 % 100 mL IVPB ( Intravenous Stopped 04/18/20 1939)  metroNIDAZOLE (FLAGYL) IVPB 500 mg ( Intravenous Stopped 04/18/20 2044)  vancomycin (VANCOCIN) IVPB 1000 mg/200 mL premix ( Intravenous Stopped 04/18/20 1959)    ED Course  I have reviewed the triage vital signs and the nursing notes.  Pertinent labs & imaging results that were available during my care of the patient were reviewed by me and considered in my medical decision making (see chart for details).    MDM Rules/Calculators/A&P                          Patient presented for evaluation of fever, weakness, generalized body aches. On exam, patient appears nontoxic. She was given Tylenol in the waiting room, and on my evaluation, patient was no longer febrile tachycardic. She has no left-sided weakness or left-sided droop. As such, doubt CVA. Likely weakness due to fever. Will obtain a CT to rule out obvious intracranial abnormality. Will obtain labs, urine, chest x-ray and reassess.  Labs show leukocytosis of 17. In the setting of fever, tachycardia, leukocytosis, code sepsis was called and antibiotics started for unknown source. Remaining labs and work-up pending.  Chest x-ray viewed interpreted by me, no pneumonia pneumothorax effusion, cardiomegaly. EKG shows normal sinus rhythm. Labs otherwise reassuring, no electrolyte abnormality. Lactic negative. Covid negative. CT head negative for acute findings. Urine pending.  Urine shows possible infection, with rare bacteria and small leukocytes. In the setting of fever leukocytosis, will  treat for UTI. Patient has remained stable without recurrence of fever or tachycardia. She remains well-appearing. As such, I feel she can likely be treated outpatient for a UTI, discussed with attending, Dr. Darl Householder evaluated patient. Encourage close follow-up with PCP for recheck. At this time, patient appears safe for discharge. Return precautions given. Patient and daughter state they understand and agree to plan.  Final Clinical Impression(s) / ED Diagnoses Final diagnoses:  Fever, unspecified fever cause  Urinary tract infection without hematuria, site unspecified    Rx / DC Orders ED Discharge Orders         Ordered    cephALEXin (KEFLEX) 250 MG capsule  4 times daily     Discontinue  Reprint     04/18/20 2034           Franchot Heidelberg, PA-C 04/18/20 2108    Drenda Freeze, MD 04/21/20 [phone]

## 2020-04-18 NOTE — ED Triage Notes (Signed)
Fatigue. Pain in her arms. Leaning to the left per daughter. She is alert and oriented. Denies fever, vomiting or diarrhea. She is alert and oriented.

## 2020-04-18 NOTE — ED Notes (Signed)
Per her daughter she found her mom sitting on the couch slumped over and c/o left arm pain and she was clammy and warm , ems came  And then she called  Her dr and was sent here with temp  Of 103

## 2020-04-18 NOTE — Progress Notes (Signed)
Pharmacy Antibiotic Note  Patricia [name] is a 86 y.o. female admitted on 04/18/2020 with sepsis.  Pharmacy has been consulted for Cefepime and Vancomycin dosing.   Height: 5\' 6"  (167.6 cm) Weight: 73.9 kg (163 lb) IBW/kg (Calculated) : 59.3  Temp (24hrs), Avg:100.8 F (38.2 C), Min:99.5 F (37.5 C), Max:103.3 F (39.6 C)  Recent Labs  Lab 04/18/20 1747 04/18/20 1930  WBC 17.1*  --   CREATININE 0.80  --   LATICACIDVEN 1.8 1.8    Estimated Creatinine Clearance: 50 mL/min (by C-G formula based on SCr of 0.8 mg/dL).    Allergies  Allergen Reactions  . Fosamax [Alendronate Sodium] Other (See Comments)    Upset stomach    Antimicrobials this admission: 7/13 Cefepime >>  7/13 Vancomycin >>   Dose adjustments this admission: N/a  Microbiology results: Pending   Plan:  - Cefepime 2g IV q12h - Vancomycin 1000mg  IV x 1 dose  - Followed by Vancomycin 750mg  IV q12h ( nomogram dosing)  - Monitor patients renal function and urine output  - De-escalate ABX when appropriate   Thank you for allowing pharmacy to be a part of this patient's care.  Duanne Limerick PharmD. BCPS 04/18/2020 8:40 PM

## 2020-04-20 LAB — URINE CULTURE: Culture: <10000 — AB

## 2020-04-23 LAB — CULTURE, BLOOD (ROUTINE X 2)
Culture: NO GROWTH
Culture: NO GROWTH
Special Requests: ADEQUATE
Special Requests: ADEQUATE

## 2020-05-03 DIAGNOSIS — R413 Other amnesia: Secondary | ICD-10-CM | POA: Diagnosis not present

## 2020-05-03 DIAGNOSIS — N390 Urinary tract infection, site not specified: Secondary | ICD-10-CM | POA: Diagnosis not present

## 2020-05-19 DIAGNOSIS — N3000 Acute cystitis without hematuria: Secondary | ICD-10-CM | POA: Diagnosis not present

## 2020-05-19 DIAGNOSIS — R350 Frequency of micturition: Secondary | ICD-10-CM | POA: Diagnosis not present

## 2020-05-19 DIAGNOSIS — N3942 Incontinence without sensory awareness: Secondary | ICD-10-CM | POA: Diagnosis not present

## 2020-06-05 DIAGNOSIS — N281 Cyst of kidney, acquired: Secondary | ICD-10-CM | POA: Diagnosis not present

## 2020-06-05 DIAGNOSIS — N3000 Acute cystitis without hematuria: Secondary | ICD-10-CM | POA: Diagnosis not present

## 2020-06-05 DIAGNOSIS — R350 Frequency of micturition: Secondary | ICD-10-CM | POA: Diagnosis not present

## 2020-06-08 DIAGNOSIS — R300 Dysuria: Secondary | ICD-10-CM | POA: Diagnosis not present

## 2020-06-23 DIAGNOSIS — N3000 Acute cystitis without hematuria: Secondary | ICD-10-CM | POA: Diagnosis not present

## 2020-06-23 DIAGNOSIS — K8020 Calculus of gallbladder without cholecystitis without obstruction: Secondary | ICD-10-CM | POA: Diagnosis not present

## 2020-06-23 DIAGNOSIS — S3219XA Other fracture of sacrum, initial encounter for closed fracture: Secondary | ICD-10-CM | POA: Diagnosis not present

## 2020-06-23 DIAGNOSIS — N281 Cyst of kidney, acquired: Secondary | ICD-10-CM | POA: Diagnosis not present

## 2020-07-05 DIAGNOSIS — M810 Age-related osteoporosis without current pathological fracture: Secondary | ICD-10-CM | POA: Diagnosis not present

## 2020-07-24 ENCOUNTER — Institutional Professional Consult (permissible substitution): Payer: Medicare PPO | Admitting: Neurology

## 2020-08-25 DIAGNOSIS — R350 Frequency of micturition: Secondary | ICD-10-CM | POA: Diagnosis not present

## 2020-08-25 DIAGNOSIS — N3942 Incontinence without sensory awareness: Secondary | ICD-10-CM | POA: Diagnosis not present

## 2020-09-15 DIAGNOSIS — N3000 Acute cystitis without hematuria: Secondary | ICD-10-CM | POA: Diagnosis not present

## 2020-09-15 DIAGNOSIS — N3942 Incontinence without sensory awareness: Secondary | ICD-10-CM | POA: Diagnosis not present

## 2020-09-20 DIAGNOSIS — I499 Cardiac arrhythmia, unspecified: Secondary | ICD-10-CM | POA: Diagnosis not present

## 2020-09-20 DIAGNOSIS — M8589 Other specified disorders of bone density and structure, multiple sites: Secondary | ICD-10-CM | POA: Diagnosis not present

## 2020-09-20 DIAGNOSIS — I4891 Unspecified atrial fibrillation: Secondary | ICD-10-CM | POA: Diagnosis not present

## 2020-09-20 DIAGNOSIS — M85851 Other specified disorders of bone density and structure, right thigh: Secondary | ICD-10-CM | POA: Diagnosis not present

## 2020-09-20 DIAGNOSIS — M85852 Other specified disorders of bone density and structure, left thigh: Secondary | ICD-10-CM | POA: Diagnosis not present

## 2020-09-21 DIAGNOSIS — Z7901 Long term (current) use of anticoagulants: Secondary | ICD-10-CM | POA: Diagnosis not present

## 2020-09-21 DIAGNOSIS — I4891 Unspecified atrial fibrillation: Secondary | ICD-10-CM | POA: Diagnosis not present

## 2020-09-21 DIAGNOSIS — G459 Transient cerebral ischemic attack, unspecified: Secondary | ICD-10-CM | POA: Diagnosis not present

## 2020-09-21 DIAGNOSIS — R2681 Unsteadiness on feet: Secondary | ICD-10-CM | POA: Diagnosis not present

## 2020-09-21 DIAGNOSIS — E039 Hypothyroidism, unspecified: Secondary | ICD-10-CM | POA: Diagnosis not present

## 2020-09-27 ENCOUNTER — Institutional Professional Consult (permissible substitution): Payer: Medicare PPO | Admitting: Neurology

## 2020-10-11 DIAGNOSIS — R059 Cough, unspecified: Secondary | ICD-10-CM | POA: Diagnosis not present

## 2020-10-27 ENCOUNTER — Other Ambulatory Visit: Payer: Self-pay | Admitting: Nurse Practitioner

## 2020-10-27 ENCOUNTER — Telehealth: Payer: Self-pay | Admitting: Nurse Practitioner

## 2020-10-27 DIAGNOSIS — U071 COVID-19: Secondary | ICD-10-CM

## 2020-10-27 DIAGNOSIS — I639 Cerebral infarction, unspecified: Secondary | ICD-10-CM

## 2020-10-27 DIAGNOSIS — D472 Monoclonal gammopathy: Secondary | ICD-10-CM

## 2020-10-27 NOTE — Progress Notes (Signed)
I connected by phone with Patricia [name] on 10/27/2020 at 1:29 PM to discuss the potential use of a new treatment for mild to moderate COVID-19 viral infection in non-hospitalized patients.  This patient is a 87 y.o. female that meets the FDA criteria for Emergency Use Authorization of COVID monoclonal antibody sotrovimab.  Has a (+) direct SARS-CoV-2 viral test result  Has mild or moderate COVID-19   Is NOT hospitalized due to COVID-19  Is within 10 days of symptom onset  Has at least one of the high risk factor(s) for progression to severe COVID-19 and/or hospitalization as defined in EUA.  Specific high risk criteria : Older age (>/= 85 yo), Immunosuppressive Disease or Treatment and Cardiovascular disease or hypertension   I have spoken and communicated the following to the patient or parent/caregiver regarding COVID monoclonal antibody treatment:  1. FDA has authorized the emergency use for the treatment of mild to moderate COVID-19 in adults and pediatric patients with positive results of direct SARS-CoV-2 viral testing who are 45 years of age and older weighing at least 40 kg, and who are at high risk for progressing to severe COVID-19 and/or hospitalization.  2. The significant known and potential risks and benefits of COVID monoclonal antibody, and the extent to which such potential risks and benefits are unknown.  3. Information on available alternative treatments and the risks and benefits of those alternatives, including clinical trials.  4. Patients treated with COVID monoclonal antibody should continue to self-isolate and use infection control measures (e.g., wear mask, isolate, social distance, avoid sharing personal items, clean and disinfect "high touch" surfaces, and frequent handwashing) according to CDC guidelines.   5. The patient or parent/caregiver has the option to accept or refuse COVID monoclonal antibody treatment.  After reviewing this information with the  patient, the patient has agreed to receive one of the available covid 19 monoclonal antibodies and will be provided an appropriate fact sheet prior to infusion. Jobe Gibbon, NP 10/27/2020 1:29 PM

## 2020-10-27 NOTE — Telephone Encounter (Signed)
Called to Discuss with patient about Covid symptoms and the use of the monoclonal antibody infusion for those with mild to moderate Covid symptoms and at a high risk of hospitalization.     Pt appears to qualify for this infusion due to co-morbid conditions and/or a member of an at-risk group in accordance with the FDA Emergency Use Authorization.    Unable to reach pt. Voicemail left and My Chart message sent.   Symptom onset: Unsure Vaccinated: None documented Qualified for Infusion: Will need to speak with patient further to determine symptom onset. Hx: htn, a-fib, stroke, MGUS  Alda Lea, NP [facility]  [phone]

## 2020-10-28 ENCOUNTER — Ambulatory Visit (HOSPITAL_COMMUNITY)
Admission: RE | Admit: 2020-10-28 | Discharge: 2020-10-28 | Disposition: A | Discharge status: Home / Self Care | Payer: Medicare PPO | Source: Ambulatory Visit | Attending: Pulmonary Disease | Admitting: Pulmonary Disease

## 2020-10-28 DIAGNOSIS — I639 Cerebral infarction, unspecified: Secondary | ICD-10-CM

## 2020-10-28 DIAGNOSIS — U071 COVID-19: Secondary | ICD-10-CM | POA: Insufficient documentation

## 2020-10-28 DIAGNOSIS — D472 Monoclonal gammopathy: Secondary | ICD-10-CM

## 2020-10-28 MED ORDER — FAMOTIDINE IN NACL 20-0.9 MG/50ML-% IV SOLN: 20.0000 mg | Freq: Once | INTRAVENOUS | Status: DC | PRN

## 2020-10-28 MED ORDER — ALBUTEROL SULFATE HFA 108 (90 BASE) MCG/ACT IN AERS: 2.0000 | INHALATION_SPRAY | Freq: Once | RESPIRATORY_TRACT | Status: DC | PRN

## 2020-10-28 MED ORDER — DIPHENHYDRAMINE HCL 50 MG/ML IJ SOLN: 50.0000 mg | Freq: Once | INTRAMUSCULAR | Status: DC | PRN

## 2020-10-28 MED ORDER — SODIUM CHLORIDE 0.9 % IV SOLN: INTRAVENOUS | Status: DC | PRN

## 2020-10-28 MED ORDER — SOTROVIMAB 500 MG/8ML IV SOLN
500.0000 mg | Freq: Once | INTRAVENOUS | Status: AC
  Administered 2020-10-28: 500 mg via INTRAVENOUS

## 2020-10-28 MED ORDER — EPINEPHRINE 0.3 MG/0.3ML IJ SOAJ: 0.3000 mg | Freq: Once | INTRAMUSCULAR | Status: DC | PRN

## 2020-10-28 MED ORDER — METHYLPREDNISOLONE SODIUM SUCC 125 MG IJ SOLR: 125.0000 mg | Freq: Once | INTRAMUSCULAR | Status: DC | PRN

## 2020-10-28 NOTE — Discharge Instructions (Signed)

## 2020-10-28 NOTE — Progress Notes (Signed)
Patient reviewed Fact Sheet for Patients, Parents, and Caregivers for Emergency Use Authorization (EUA) of sotrovimab for the Treatment of Coronavirus. Patient also reviewed and is agreeable to the estimated cost of treatment. Patient is agreeable to proceed.   

## 2020-10-28 NOTE — Progress Notes (Signed)
Diagnosis: COVID-19  Physician: Dr. Patrick Wright  Procedure: [facility] Infusion Clinic Med: Sotrovimab infusion - Provided patient with sotrovimab fact sheet for patients, parents, and caregivers prior to infusion.   Complications: No immediate complications noted  Discharge: Discharged home    

## 2020-11-17 DIAGNOSIS — N3946 Mixed incontinence: Secondary | ICD-10-CM | POA: Diagnosis not present

## 2020-11-17 DIAGNOSIS — R350 Frequency of micturition: Secondary | ICD-10-CM | POA: Diagnosis not present

## 2020-11-22 DIAGNOSIS — R350 Frequency of micturition: Secondary | ICD-10-CM | POA: Diagnosis not present

## 2020-11-22 DIAGNOSIS — N3942 Incontinence without sensory awareness: Secondary | ICD-10-CM | POA: Diagnosis not present

## 2020-11-22 DIAGNOSIS — N3946 Mixed incontinence: Secondary | ICD-10-CM | POA: Diagnosis not present

## 2020-12-18 ENCOUNTER — Emergency Department (HOSPITAL_COMMUNITY): Payer: Medicare PPO

## 2020-12-18 ENCOUNTER — Other Ambulatory Visit: Payer: Self-pay

## 2020-12-18 ENCOUNTER — Encounter (HOSPITAL_COMMUNITY): Payer: Self-pay

## 2020-12-18 ENCOUNTER — Emergency Department (HOSPITAL_COMMUNITY)
Admission: EM | Admit: 2020-12-18 | Discharge: 2020-12-19 | Disposition: A | Discharge status: Home / Self Care | Payer: Medicare PPO | Attending: Emergency Medicine | Admitting: Emergency Medicine

## 2020-12-18 DIAGNOSIS — Z7982 Long term (current) use of aspirin: Secondary | ICD-10-CM | POA: Diagnosis not present

## 2020-12-18 DIAGNOSIS — I10 Essential (primary) hypertension: Secondary | ICD-10-CM | POA: Diagnosis not present

## 2020-12-18 DIAGNOSIS — Z7901 Long term (current) use of anticoagulants: Secondary | ICD-10-CM | POA: Diagnosis not present

## 2020-12-18 DIAGNOSIS — R0902 Hypoxemia: Secondary | ICD-10-CM | POA: Diagnosis not present

## 2020-12-18 DIAGNOSIS — R42 Dizziness and giddiness: Secondary | ICD-10-CM | POA: Insufficient documentation

## 2020-12-18 DIAGNOSIS — R110 Nausea: Secondary | ICD-10-CM | POA: Diagnosis not present

## 2020-12-18 DIAGNOSIS — Z79899 Other long term (current) drug therapy: Secondary | ICD-10-CM | POA: Diagnosis not present

## 2020-12-18 DIAGNOSIS — R61 Generalized hyperhidrosis: Secondary | ICD-10-CM | POA: Insufficient documentation

## 2020-12-18 DIAGNOSIS — E039 Hypothyroidism, unspecified: Secondary | ICD-10-CM | POA: Diagnosis not present

## 2020-12-18 DIAGNOSIS — R55 Syncope and collapse: Secondary | ICD-10-CM

## 2020-12-18 DIAGNOSIS — I4891 Unspecified atrial fibrillation: Secondary | ICD-10-CM | POA: Diagnosis not present

## 2020-12-18 DIAGNOSIS — I491 Atrial premature depolarization: Secondary | ICD-10-CM | POA: Diagnosis not present

## 2020-12-18 LAB — COMPREHENSIVE METABOLIC PANEL
ALT: 22 U/L (ref 0–44)
AST: 24 U/L (ref 15–41)
Albumin: 4 g/dL (ref 3.5–5.0)
Alkaline Phosphatase: 47 U/L (ref 38–126)
Anion gap: 8 (ref 5–15)
BUN: 18 mg/dL (ref 8–23)
CO2: 29 mmol/L (ref 22–32)
Calcium: 9.3 mg/dL (ref 8.9–10.3)
Chloride: 102 mmol/L (ref 98–111)
Creatinine, Ser: 0.92 mg/dL (ref 0.44–1.00)
GFR, Estimated: 60 mL/min — ABNORMAL LOW (ref >60)
Glucose, Bld: 168 mg/dL — ABNORMAL HIGH (ref 70–99)
Potassium: 3.8 mmol/L (ref 3.5–5.1)
Sodium: 139 mmol/L (ref 135–145)
Total Bilirubin: 0.8 mg/dL (ref 0.3–1.2)
Total Protein: 8 g/dL (ref 6.5–8.1)

## 2020-12-18 LAB — CBC WITH DIFFERENTIAL/PLATELET
Abs Immature Granulocytes: 0.04 10*3/uL (ref 0.00–0.07)
Basophils Absolute: 0 10*3/uL (ref 0.0–0.1)
Basophils Relative: 1 %
Eosinophils Absolute: 0 10*3/uL (ref 0.0–0.5)
Eosinophils Relative: 1 %
HCT: 41.3 % (ref 36.0–46.0)
Hemoglobin: 13 g/dL (ref 12.0–15.0)
Immature Granulocytes: 1 %
Lymphocytes Relative: 15 %
Lymphs Abs: 0.9 10*3/uL (ref 0.7–4.0)
MCH: 30.7 pg (ref 26.0–34.0)
MCHC: 31.5 g/dL (ref 30.0–36.0)
MCV: 97.6 fL (ref 80.0–100.0)
Monocytes Absolute: 0.4 10*3/uL (ref 0.1–1.0)
Monocytes Relative: 7 %
Neutro Abs: 4.7 10*3/uL (ref 1.7–7.7)
Neutrophils Relative %: 75 %
Platelets: 247 10*3/uL (ref 150–400)
RBC: 4.23 MIL/uL (ref 3.87–5.11)
RDW: 13.5 % (ref 11.5–15.5)
WBC: 6.2 10*3/uL (ref 4.0–10.5)
nRBC: 0 % (ref 0.0–0.2)

## 2020-12-18 LAB — TROPONIN I (HIGH SENSITIVITY)
Troponin I (High Sensitivity): 21 ng/L — ABNORMAL HIGH (ref <18)
Troponin I (High Sensitivity): 29 ng/L — ABNORMAL HIGH (ref <18)

## 2020-12-18 LAB — LIPASE, BLOOD: Lipase: 28 U/L (ref 11–51)

## 2020-12-18 MED ORDER — SODIUM CHLORIDE 0.9 % IV BOLUS
1000.0000 mL | Freq: Once | INTRAVENOUS | Status: AC
  Administered 2020-12-18: 1000 mL via INTRAVENOUS

## 2020-12-18 NOTE — ED Provider Notes (Signed)
[facility] DEPT Provider Note   CSN: [id number] Arrival date & time: 12/18/20  1933     History No chief complaint on file.   Patricia [name] is a 87 y.o. female history of reflux, depression, high cholesterol, stroke, A. fib on Eliquis here presenting with nausea and diaphoresis.  Patient states that she just finished eating dinner and was at the restaurant and felt lightheaded and dizzy.  She states that she was diaphoretic at that time.  Denies any chest pain.  Denies actually passing out or seizure-like activity.  Denies any trouble speaking or focal weakness.  Patient noticed some dark stools over the last week or so but denies any blood in her stools.  Patient is on Eliquis for A. fib.  The history is provided by the patient.       Past Medical History:  Diagnosis Date  . Bladder incontinence   . Depression   . GERD (gastroesophageal reflux disease)   . High cholesterol   . Hypothyroid   . Recurrent falls   . Stroke [facility])     Patient Active Problem List   Diagnosis Date Noted  . Intractable back pain 12/22/2018  . Hypothyroid 12/22/2018  . Hyperglycemia 12/22/2018  . Recurrent falls 04/27/2018  . Small fiber neuropathy 09/03/2016  . Compression fracture of thoracic vertebra (HCC) 06/21/2016  . MGUS (monoclonal gammopathy of unknown significance) 02/16/2016  . Quality of life palliative care encounter 02/16/2016  . Elevated blood pressure reading 02/16/2016  . Polyneuropathy 01/15/2016  . Neuropathy 01/10/2016  . Occipital stroke ([place]) 02/21/2015    Past Surgical History:  Procedure Laterality Date  . CARPAL TUNNEL RELEASE    . CATARACT EXTRACTION Bilateral   . EYE SURGERY Left 02/15/15    Laser surgery      OB History   No obstetric history on file.     Family History  Problem Relation Age of Onset  . Heart disease Mother   . Throat cancer Maternal Aunt   . Cancer Maternal Aunt        breast ca  . Lupus Daughter   .  Anuerysm Daughter        Brain    Social History   Tobacco Use  . Smoking status: Never Smoker  . Smokeless tobacco: Never Used  Vaping Use  . Vaping Use: Never used  Substance Use Topics  . Alcohol use: Yes    Alcohol/week: 0.0 standard drinks    Comment: Very rarley   . Drug use: No    Home Medications Prior to Admission medications   Medication Sig Start Date End Date Taking? Authorizing Provider  aspirin 81 MG chewable tablet Chew 81 mg by mouth daily.      atorvastatin (LIPITOR) 10 MG tablet Take 1 tablet (10 mg total) by mouth daily. Please have primary care provide further refills. 02/11/19   Melvenia Beam, MD  Biotin 5000 MCG TABS Take 1 tablet by mouth daily.    [provider]  CALCIUM PO Take 1,000 mg by mouth 2 (two) times daily.     [provider]  cholecalciferol (VITAMIN D) 1000 units tablet Take 1,000 Units by mouth 2 (two) times daily.     [provider]  denosumab (PROLIA) 60 MG/ML SOSY injection Inject 60 mg into the skin every 6 (six) months.    [provider]  diclofenac sodium (VOLTAREN) 1 % GEL Apply 1 application topically 4 (four) times daily. 12/18/18  [provider]  Flaxseed, Linseed, (FLAX SEED OIL PO) Take 1,000 mg by mouth daily.    [provider]  GLUCOSAMINE-CHONDROITIN PO Take 1,500 mg by mouth daily.    [provider]  levothyroxine (SYNTHROID, LEVOTHROID) 88 MCG tablet Take 88 mcg by mouth daily before breakfast.    [provider]  MELATONIN PO Take 10 mg by mouth at bedtime as needed.     [provider]  Multiple Vitamin (MULTIVITAMIN) tablet Take 1 tablet by mouth daily.    [provider]  Omega-3 Fatty Acids (FISH OIL PO) Take 1,200 mg by mouth daily.    [provider]  polyethylene glycol (MIRALAX / GLYCOLAX) packet Take 17 g by mouth daily as needed for mild constipation.     [provider]  Probiotic Product (PROBIOTIC  DAILY PO) Take 1 tablet by mouth daily.    [provider]  sertraline (ZOLOFT) 50 MG tablet Take 50 mg by mouth daily.     [provider]  tolterodine (DETROL LA) 4 MG 24 hr capsule Take 4 mg by mouth daily. 12/13/18   [provider]  TURMERIC PO Take 750 mg by mouth daily.    [provider]  vitamin B-12 (CYANOCOBALAMIN) 1000 MCG tablet Take 1,000 mcg by mouth daily.    [provider]  vitamin C (ASCORBIC ACID) 500 MG tablet Take 500 mg by mouth daily.    [provider]    Allergies    Fosamax [alendronate sodium]  Review of Systems   Review of Systems  Neurological: Positive for dizziness.  All other systems reviewed and are negative.   Physical Exam Updated Vital Signs BP (!) 120/95 (BP Location: Left Arm)   Pulse 71   Temp 98.5 F (36.9 C)   Resp 15   SpO2 97%   Physical Exam Vitals and nursing note reviewed.  Constitutional:      Appearance: Normal appearance.  HENT:     Head: Normocephalic.     Mouth/Throat:     Mouth: Mucous membranes are moist.  Eyes:     Extraocular Movements: Extraocular movements intact.     Pupils: Pupils are equal, round, and reactive to light.  Cardiovascular:     Rate and Rhythm: Normal rate and regular rhythm.     Pulses: Normal pulses.     Heart sounds: Normal heart sounds.  Pulmonary:     Effort: Pulmonary effort is normal.     Breath sounds: Normal breath sounds.  Abdominal:     General: Abdomen is flat.     Palpations: Abdomen is soft.  Musculoskeletal:        General: Normal range of motion.     Cervical back: Normal range of motion.  Skin:    General: Skin is warm.     Capillary Refill: Capillary refill takes less than 2 seconds.  Neurological:     General: No focal deficit present.     Mental Status: She is alert and oriented to person, place, and time.  Psychiatric:        Mood and Affect: Mood normal.        Behavior: Behavior normal.     ED Results /  Procedures / Treatments   Labs (all labs ordered are listed, but only abnormal results are displayed) Labs Reviewed  CBC WITH DIFFERENTIAL/PLATELET  COMPREHENSIVE METABOLIC PANEL  LIPASE, BLOOD  TROPONIN I (HIGH SENSITIVITY)    EKG EKG Interpretation  Date/Time:  Monday December 18 2020 19:47:54 EDT Ventricular Rate:  70 PR Interval:    QRS Duration: 123 QT Interval:  411 QTC Calculation: 444 R Axis:   -3 Text Interpretation: Sinus rhythm Ventricular premature complex Nonspecific intraventricular conduction delay No significant change since last tracing Confirmed by Wandra Arthurs [phone]) on 12/18/2020 7:54:07 PM   Radiology No results found.  Procedures Procedures   Medications Ordered in ED Medications  sodium chloride 0.9 % bolus 1,000 mL (1,000 mLs Intravenous Bolus from Bag 12/18/20 1959)    ED Course  I have reviewed the triage vital signs and the nursing notes.  Pertinent labs & imaging results that were available during my care of the patient were reviewed by me and considered in my medical decision making (see chart for details).    MDM Rules/Calculators/A&P                         Patricia [name] is a 87 y.o. female here presenting with diaphoresis and dizziness.  She has history of A. fib consider arrhythmia.  Also consider head bleed as well.  Will get CBC and CMP and troponin x2, CT head.  No neuro deficits right now so we will hold off on MRI  11:41 PM Initial troponin was 21 and second troponin was 29 which is stable.  CT head is unremarkable.  Patient is stable for discharge.  Considered arrhythmia but her cardiac monitor in the ED was unremarkable. Will refer to cardiology outpatient   Final Clinical Impression(s) / ED Diagnoses Final diagnoses:  None    Rx / DC Orders ED Discharge Orders    None       Drenda Freeze, MD 12/18/20 2342

## 2020-12-18 NOTE — Discharge Instructions (Signed)
Stay hydrated  Continue current meds  You may need to follow-up with a cardiologist for further evaluation  Return to ER if you have syncope, chest pain, shortness of breath

## 2020-12-18 NOTE — ED Triage Notes (Signed)
Pt has a recent dx of a-fib and placed on eliquis, within the month, tonight she was eating dinner and had a brief episode of feeling clammy and sweaty and thought she was going to pass out, she denies any palpitations or chest pain, she did state she had some indigestion\ Pt also states she's had dark stools for one week

## 2020-12-19 ENCOUNTER — Encounter: Payer: Self-pay | Admitting: *Deleted

## 2020-12-19 ENCOUNTER — Encounter: Payer: Self-pay | Admitting: Cardiology

## 2020-12-19 ENCOUNTER — Ambulatory Visit: Payer: Medicare PPO | Admitting: Cardiology

## 2020-12-19 ENCOUNTER — Ambulatory Visit (INDEPENDENT_AMBULATORY_CARE_PROVIDER_SITE_OTHER): Payer: Medicare PPO

## 2020-12-19 ENCOUNTER — Other Ambulatory Visit: Payer: Self-pay

## 2020-12-19 VITALS — BP 100/60 | HR 74 | Ht 66.0 in | Wt 160.0 lb

## 2020-12-19 DIAGNOSIS — I4891 Unspecified atrial fibrillation: Secondary | ICD-10-CM

## 2020-12-19 DIAGNOSIS — R55 Syncope and collapse: Secondary | ICD-10-CM | POA: Diagnosis not present

## 2020-12-19 NOTE — Progress Notes (Signed)
Patient ID: Patricia [name], female   DOB: November 08, 1929, 87 y.o.   MRN: [id number] Patient enrolled for [employer] to ship a 14 day ZIO XT long term holter monitor to her home.

## 2020-12-19 NOTE — Progress Notes (Signed)
Cardiology Office Note:    Date:  12/19/2020   ID:  Patricia [name], DOB Oct 23, 1929, MRN [id number]  PCP:  Deland Pretty, [place]  Cardiologist:  No primary care provider on file.  Advanced Practice Provider:  No care team member to display Electrophysiologist:  None       Referring MD: Deland Pretty, MD     History of Present Illness:    Patricia [name] is a 87 y.o. female here for the evaluation of near syncope.  She was in the emergency department on 12/18/2020.  She just had finished eating dinner was at the restaurant felt lightheaded dizzy diaphoretic.  She had just finished eating [place] food with extremely sweet sweet tea. She did not actually lose consciousness. 2 weeks ago happened at home before. Gave water.  Troponin values were 21 and 29.  CT of the head was unremarkable.  Cardiac monitoring was unremarkable during the ER visit.  Had TIA a few years ago. Confused tried to turn TV off with cell.  MRI brain showed small old left occipital cerebellar strokes.  She has paroxysmal atrial fibrillation.  She has been taking Eliquis for the past 2 months prescribed by Dr. Shelia Media. Will ask for ECG.   Has not had chest pain.  She is on no antihypertensives.  Her creatinine was 0.92 BUN was 18.  Sodium 139 potassium 3.8.  No evidence of dehydration.  Hemoglobin 13.  Past Medical History:  Diagnosis Date  . Bladder incontinence   . Depression   . GERD (gastroesophageal reflux disease)   . High cholesterol   . Hypothyroid   . Recurrent falls   . Stroke [facility])     Past Surgical History:  Procedure Laterality Date  . CARPAL TUNNEL RELEASE    . CATARACT EXTRACTION Bilateral   . EYE SURGERY Left 02/15/15    Laser surgery     Current Medications: Current Meds  Medication Sig  . atorvastatin (LIPITOR) 40 MG tablet Take 40 mg by mouth daily.  Marland Kitchen CALCIUM PO Take 1,000 mg by mouth 2 (two) times daily.   . cholecalciferol (VITAMIN D) 1000 units  tablet Take 1,000 Units by mouth 2 (two) times daily.   Marland Kitchen denosumab (PROLIA) 60 MG/ML SOSY injection Inject 60 mg into the skin every 6 (six) months.  Marland Kitchen ELIQUIS 5 MG TABS tablet Take 5 mg by mouth 2 (two) times daily.  Marland Kitchen levothyroxine (SYNTHROID, LEVOTHROID) 88 MCG tablet Take 88 mcg by mouth daily before breakfast.  . MELATONIN PO Take 10 mg by mouth at bedtime as needed.   . Multiple Vitamin (MULTIVITAMIN) tablet Take 1 tablet by mouth daily.  . pantoprazole (PROTONIX) 40 MG tablet Take 40 mg by mouth 2 (two) times daily.  . Probiotic Product (PROBIOTIC DAILY PO) Take 1 tablet by mouth daily.  . sertraline (ZOLOFT) 50 MG tablet Take 50 mg by mouth daily.   Marland Kitchen tolterodine (DETROL LA) 4 MG 24 hr capsule Take 4 mg by mouth daily.  . vitamin B-12 (CYANOCOBALAMIN) 1000 MCG tablet Take 1,000 mcg by mouth daily.  . vitamin C (ASCORBIC ACID) 500 MG tablet Take 500 mg by mouth daily.     Allergies:   Fosamax [alendronate sodium]   Social History   Socioeconomic History  . Marital status: Married    Spouse name: Joe   . Number of children: 4  . Years of education: 16+  . Highest education level: Not on file  Occupational History  . Not on file  Tobacco Use  . Smoking status: Never Smoker  . Smokeless tobacco: Never Used  Vaping Use  . Vaping Use: Never used  Substance and Sexual Activity  . Alcohol use: Yes    Alcohol/week: 0.0 standard drinks    Comment: Very rarley   . Drug use: No  . Sexual activity: Not on file    Comment: widowed, 2 daughters and 1 son. Retired Education officer, museum  Other Topics Concern  . Not on file  Social History Narrative   Lives at home with husband   Caffeine use:  2 cups coffee every morning   [employer] and tea   Social Determinants of Health   Financial Resource Strain: Not on file  Food Insecurity: Not on file  Transportation Needs: Not on file  Physical Activity: Not on file  Stress: Not on file  Social Connections: Not on file     Family  History: The patient's family history includes Anuerysm in her daughter; Cancer in her maternal aunt; Heart disease in her mother; Lupus in her daughter; Throat cancer in her maternal aunt.  ROS:   Please see the history of present illness.     All other systems reviewed and are negative.  EKGs/Labs/Other Studies Reviewed:    The following studies were reviewed today: MRI brain, prior echocardiogram 2016 showed normal EF  EKG:  EKG is ordered today.  The ekg ordered today demonstrates sinus rhythm 70 with PVC.  Recent Labs: 12/18/2020: ALT 22; BUN 18; Creatinine, Ser 0.92; Hemoglobin 13.0; Platelets 247; Potassium 3.8; Sodium 139  Recent Lipid Panel No results found for: CHOL, TRIG, HDL, CHOLHDL, VLDL, LDLCALC, LDLDIRECT   Risk Assessment/Calculations:      Physical Exam:    VS:  BP 100/60 (BP Location: Left Arm, Patient Position: Sitting, Cuff Size: Normal)   Pulse 74   Ht 5\' 6"  (1.676 m)   Wt 160 lb (72.6 kg)   SpO2 97%   BMI 25.82 kg/m     Wt Readings from Last 3 Encounters:  12/19/20 160 lb (72.6 kg)  04/18/20 163 lb (73.9 kg)  12/22/18 173 lb (78.5 kg)     GEN:  Well nourished, well developed in no acute distress HEENT: Normal NECK: No JVD; No carotid bruits LYMPHATICS: No lymphadenopathy CARDIAC: RRR, no murmurs, rubs, gallops RESPIRATORY:  Clear to auscultation without rales, wheezing or rhonchi  ABDOMEN: Soft, non-tender, non-distended MUSCULOSKELETAL:  No edema; No deformity  SKIN: Warm and dry NEUROLOGIC:  Alert and oriented x 3 PSYCHIATRIC:  Normal affect   ASSESSMENT:    1. Atrial fibrillation, unspecified type ([place])   2. Near syncope    PLAN:    In order of problems listed above:  Near syncope -We will check an echocardiogram to ensure proper structure and function of her heart -We will check a Zio patch monitor for 14 days. -With her low normal blood pressure, event happening after eating, could be vasovagal in etiology.  Instructed her to  hydrate well, liberalize salt intake.  She already does put quite a bit of salt on her food she states. -Could be arrhythmia.  Paroxysmal atrial fibrillation -Was in sinus rhythm with PAC noted on ECG.  We will asked Dr. Concha Pyo for EKG from 2 months ago when he started Eliquis for documentation of atrial fibrillation so that we have it in our system.    Medication Adjustments/Labs and Tests Ordered: Current medicines are reviewed at length with the patient  today.  Concerns regarding medicines are outlined above.  Orders Placed This Encounter  Procedures  . LONG TERM MONITOR (3-14 DAYS)  . ECHOCARDIOGRAM COMPLETE   No orders of the defined types were placed in this encounter.   Patient Instructions  Medication Instructions:  The current medical regimen is effective;  continue present plan and medications.  *If you need a refill on your cardiac medications before your next appointment, please call your pharmacy*  Testing/Procedures: Your physician has requested that you have an echocardiogram. Echocardiography is a painless test that uses sound waves to create images of your heart. It provides your doctor with information about the size and shape of your heart and how well your heart's chambers and valves are working. This procedure takes approximately one hour. There are no restrictions for this procedure.  ZIO XT- Long Term Monitor Instructions   Your physician has requested you wear your ZIO patch monitor 14 days.   This is a single patch monitor.  [employer] supplies one patch monitor per enrollment.  Additional stickers are not available.   Please do not apply patch if you will be having a Nuclear Stress Test, Echocardiogram, Cardiac CT, MRI, or Chest Xray during the time frame you would be wearing the monitor. The patch cannot be worn during these tests.  You cannot remove and re-apply the ZIO XT patch monitor.   Your ZIO patch monitor will be sent USPS Priority mail from [facility] directly to your home address. The monitor may also be mailed to a PO BOX if home delivery is not available.   It may take 3-5 days to receive your monitor after you have been enrolled.   Once you have received you monitor, please review enclosed instructions.  Your monitor has already been registered assigning a specific monitor serial # to you.   Applying the monitor   Shave hair from upper left chest.   Hold abrader disc by orange tab.  Rub abrader in 40 strokes over left upper chest as indicated in your monitor instructions.   Clean area with 4 enclosed alcohol pads .  Use all pads to assure are is cleaned thoroughly.  Let dry.   Apply patch as indicated in monitor instructions.  Patch will be place under collarbone on left side of chest with arrow pointing upward.   Rub patch adhesive wings for 2 minutes.Remove white label marked "1".  Remove white label marked "2".  Rub patch adhesive wings for 2 additional minutes.   While looking in a mirror, press and release button in center of patch.  A small green light will flash 3-4 times .  This will be your only indicator the monitor has been turned on.     Do not shower for the first 24 hours.  You may shower after the first 24 hours.   Press button if you feel a symptom. You will hear a small click.  Record Date, Time and Symptom in the Patient Log Book.   When you are ready to remove patch, follow instructions on last 2 pages of Patient Log Book.  Stick patch monitor onto last page of Patient Log Book.   Place Patient Log Book in [place] box.  Use locking tab on box and tape box closed securely.  The [employer] has [employer] on it.  Please place in mailbox as soon as possible.  Your physician should have your test results approximately 7 days after the monitor has been mailed  back to [employer].   Call [place] at [phone] if you have questions regarding your ZIO XT patch monitor.   Call them immediately if you see an orange light blinking on your monitor.   If your monitor falls off in less than 4 days contact our Monitor department at [phone].  If your monitor becomes loose or falls off after 4 days call [employer] at [phone] for suggestions on securing your monitor.   Follow-Up: At [facility], you and your health needs are our priority.  As part of our continuing mission to provide you with exceptional heart care, we have created designated Provider Care Teams.  These Care Teams include your primary Cardiologist (physician) and Advanced Practice Providers (APPs -  Physician Assistants and Nurse Practitioners) who all work together to provide you with the care you need, when you need it.  We recommend signing up for the patient portal called "MyChart".  Sign up information is provided on this After Visit Summary.  MyChart is used to connect with patients for Virtual Visits (Telemedicine).  Patients are able to view lab/test results, encounter notes, upcoming appointments, etc.  Non-urgent messages can be sent to your provider as well.   To learn more about what you can do with MyChart, go to [email or web address].    Your next appointment:   3 month(s)  The format for your next appointment:   In Person  Provider:   Candee Furbish, MD  Thank you for choosing [facility]         Signed, Candee Furbish, MD  12/19/2020 5:38 PM    [place]

## 2020-12-19 NOTE — ED Notes (Signed)
Pt and her son verbalized understanding of d/c and follow up care.

## 2020-12-19 NOTE — ED Notes (Signed)
Patient denies pain and is resting comfortably.  

## 2020-12-19 NOTE — Patient Instructions (Signed)
Medication Instructions:  The current medical regimen is effective;  continue present plan and medications.  *If you need a refill on your cardiac medications before your next appointment, please call your pharmacy*  Testing/Procedures: Your physician has requested that you have an echocardiogram. Echocardiography is a painless test that uses sound waves to create images of your heart. It provides your doctor with information about the size and shape of your heart and how well your heart's chambers and valves are working. This procedure takes approximately one hour. There are no restrictions for this procedure.  ZIO XT- Long Term Monitor Instructions   Your physician has requested you wear your ZIO patch monitor 14 days.   This is a single patch monitor.  [employer] supplies one patch monitor per enrollment.  Additional stickers are not available.   Please do not apply patch if you will be having a Nuclear Stress Test, Echocardiogram, Cardiac CT, MRI, or Chest Xray during the time frame you would be wearing the monitor. The patch cannot be worn during these tests.  You cannot remove and re-apply the ZIO XT patch monitor.   Your ZIO patch monitor will be sent USPS Priority mail from [facility] directly to your home address. The monitor may also be mailed to a PO BOX if home delivery is not available.   It may take 3-5 days to receive your monitor after you have been enrolled.   Once you have received you monitor, please review enclosed instructions.  Your monitor has already been registered assigning a specific monitor serial # to you.   Applying the monitor   Shave hair from upper left chest.   Hold abrader disc by orange tab.  Rub abrader in 40 strokes over left upper chest as indicated in your monitor instructions.   Clean area with 4 enclosed alcohol pads .  Use all pads to assure are is cleaned thoroughly.  Let dry.   Apply patch as indicated in monitor instructions.  Patch  will be place under collarbone on left side of chest with arrow pointing upward.   Rub patch adhesive wings for 2 minutes.Remove white label marked "1".  Remove white label marked "2".  Rub patch adhesive wings for 2 additional minutes.   While looking in a mirror, press and release button in center of patch.  A small green light will flash 3-4 times .  This will be your only indicator the monitor has been turned on.     Do not shower for the first 24 hours.  You may shower after the first 24 hours.   Press button if you feel a symptom. You will hear a small click.  Record Date, Time and Symptom in the Patient Log Book.   When you are ready to remove patch, follow instructions on last 2 pages of Patient Log Book.  Stick patch monitor onto last page of Patient Log Book.   Place Patient Log Book in [place] box.  Use locking tab on box and tape box closed securely.  The [employer] has [employer] on it.  Please place in mailbox as soon as possible.  Your physician should have your test results approximately 7 days after the monitor has been mailed back to [facility].   Call [place] at [phone] if you have questions regarding your ZIO XT patch monitor.  Call them immediately if you see an orange light blinking on your monitor.   If your monitor falls off in  less than 4 days contact our Monitor department at [phone].  If your monitor becomes loose or falls off after 4 days call [employer] at [phone] for suggestions on securing your monitor.   Follow-Up: At [facility], you and your health needs are our priority.  As part of our continuing mission to provide you with exceptional heart care, we have created designated Provider Care Teams.  These Care Teams include your primary Cardiologist (physician) and Advanced Practice Providers (APPs -  Physician Assistants and Nurse Practitioners) who all work together to provide you with the care you need, when  you need it.  We recommend signing up for the patient portal called "MyChart".  Sign up information is provided on this After Visit Summary.  MyChart is used to connect with patients for Virtual Visits (Telemedicine).  Patients are able to view lab/test results, encounter notes, upcoming appointments, etc.  Non-urgent messages can be sent to your provider as well.   To learn more about what you can do with MyChart, go to [email or web address].    Your next appointment:   3 month(s)  The format for your next appointment:   In Person  Provider:   Candee Furbish, MD  Thank you for choosing [facility]!!

## 2020-12-19 NOTE — ED Notes (Signed)
Pt currently denies dizziness and pain. No signs of diaphoresis or distress.

## 2020-12-23 DIAGNOSIS — R55 Syncope and collapse: Secondary | ICD-10-CM | POA: Diagnosis not present

## 2020-12-23 DIAGNOSIS — I4891 Unspecified atrial fibrillation: Secondary | ICD-10-CM

## 2020-12-27 ENCOUNTER — Telehealth: Payer: Self-pay | Admitting: Cardiology

## 2020-12-27 DIAGNOSIS — Z20822 Contact with and (suspected) exposure to covid-19: Secondary | ICD-10-CM | POA: Diagnosis not present

## 2020-12-27 DIAGNOSIS — Z03818 Encounter for observation for suspected exposure to other biological agents ruled out: Secondary | ICD-10-CM | POA: Diagnosis not present

## 2020-12-27 NOTE — Telephone Encounter (Signed)
Returned a call back to pt and daughter to see if I could assist with finding out who called them today or yesterday from our office.  I also do not see any documentation that our office tried calling them today or yesterday.  Nothing in appt desk or chart.  Pt has not had any recent test done by our office.  She is scheduled to have an echo on 4/12, and is currently wearing her zio monitor right now. She is is scheduled for follow-up with Dr. Marlou Porch on 6/1.  Both parties are all aware of all scheduled appts.  Informed them both I do not see where we attempted to call them. Advised them both to keep [place] posted as needed, and I will route this message to Dr. Marlou Porch RN, incase she tried reaching out to them.  Both parties verbalized understanding and was gracious for all the assistance provided.

## 2020-12-27 NOTE — Telephone Encounter (Signed)
Patient's daughter states they received a call either yesterday or the day before. She states she does not know who called or why. I did not see any notes.

## 2020-12-27 NOTE — Telephone Encounter (Signed)
I am unaware of anyone attempting to reach this pt.

## 2021-01-05 DIAGNOSIS — M810 Age-related osteoporosis without current pathological fracture: Secondary | ICD-10-CM | POA: Diagnosis not present

## 2021-01-11 ENCOUNTER — Other Ambulatory Visit (HOSPITAL_COMMUNITY): Payer: Medicare PPO

## 2021-01-11 DIAGNOSIS — R55 Syncope and collapse: Secondary | ICD-10-CM | POA: Diagnosis not present

## 2021-01-11 DIAGNOSIS — I4891 Unspecified atrial fibrillation: Secondary | ICD-10-CM | POA: Diagnosis not present

## 2021-01-16 ENCOUNTER — Other Ambulatory Visit: Payer: Self-pay

## 2021-01-16 ENCOUNTER — Ambulatory Visit (HOSPITAL_COMMUNITY): Payer: Medicare PPO | Attending: Internal Medicine

## 2021-01-16 DIAGNOSIS — I4891 Unspecified atrial fibrillation: Secondary | ICD-10-CM | POA: Insufficient documentation

## 2021-01-16 DIAGNOSIS — R55 Syncope and collapse: Secondary | ICD-10-CM | POA: Diagnosis not present

## 2021-01-16 LAB — ECHOCARDIOGRAM COMPLETE
Area-P 1/2: 2.03 cm2
S' Lateral: 2.9 cm

## 2021-01-17 DIAGNOSIS — M25571 Pain in right ankle and joints of right foot: Secondary | ICD-10-CM | POA: Diagnosis not present

## 2021-02-02 DIAGNOSIS — M25571 Pain in right ankle and joints of right foot: Secondary | ICD-10-CM | POA: Diagnosis not present

## 2021-03-07 ENCOUNTER — Ambulatory Visit: Payer: Medicare PPO | Admitting: Cardiology

## 2021-03-07 ENCOUNTER — Other Ambulatory Visit: Payer: Self-pay

## 2021-03-07 ENCOUNTER — Encounter: Payer: Self-pay | Admitting: Cardiology

## 2021-03-07 VITALS — BP 100/60 | HR 78 | Ht 66.0 in | Wt 158.0 lb

## 2021-03-07 DIAGNOSIS — I4891 Unspecified atrial fibrillation: Secondary | ICD-10-CM

## 2021-03-07 DIAGNOSIS — R55 Syncope and collapse: Secondary | ICD-10-CM

## 2021-03-07 NOTE — Patient Instructions (Signed)
Medication Instructions:  The current medical regimen is effective;  continue present plan and medications.  *If you need a refill on your cardiac medications before your next appointment, please call your pharmacy*  Follow-Up: At [facility], you and your health needs are our priority.  As part of our continuing mission to provide you with exceptional heart care, we have created designated Provider Care Teams.  These Care Teams include your primary Cardiologist (physician) and Advanced Practice Providers (APPs -  Physician Assistants and Nurse Practitioners) who all work together to provide you with the care you need, when you need it.  We recommend signing up for the patient portal called "MyChart".  Sign up information is provided on this After Visit Summary.  MyChart is used to connect with patients for Virtual Visits (Telemedicine).  Patients are able to view lab/test results, encounter notes, upcoming appointments, etc.  Non-urgent messages can be sent to your provider as well.   To learn more about what you can do with MyChart, go to [email or web address].    Your next appointment:   1 year(s)  The format for your next appointment:   In Person  Provider:   Mark Skains, MD   Thank you for choosing [facility]!!    

## 2021-03-07 NOTE — Progress Notes (Signed)
Cardiology Office Note:    Date:  03/07/2021   ID:  Patricia [name], DOB 1930-08-02, MRN [id number]  PCP:  Deland Pretty, MD   [place] Providers Cardiologist:  None     Referring MD: Deland Pretty, MD     History of Present Illness:    Patricia [name] is a 87 y.o. female here for the follow-up of near syncope. She was in the emergency department on 12/18/2020.  She just had finished eating dinner was at the restaurant felt lightheaded dizzy diaphoretic.  She had just finished eating [place] food with extremely sweet sweet tea. She did not actually lose consciousness.  Troponin values were 21 and 29.  CT of the head was unremarkable.  Cardiac monitoring was unremarkable during the ER visit.  Had TIA a few years ago. Confused tried to turn TV off with cell.  MRI brain showed small old left occipital cerebellar strokes.  She has paroxysmal atrial fibrillation.  She has been taking Eliquis   No further issues with near syncope.  Echocardiogram and ZIO monitor were reassuring.  Past Medical History:  Diagnosis Date  . Bladder incontinence   . Depression   . GERD (gastroesophageal reflux disease)   . High cholesterol   . Hypothyroid   . Recurrent falls   . Stroke [facility])     Past Surgical History:  Procedure Laterality Date  . CARPAL TUNNEL RELEASE    . CATARACT EXTRACTION Bilateral   . EYE SURGERY Left 02/15/15    Laser surgery     Current Medications: Current Meds  Medication Sig  . atorvastatin (LIPITOR) 40 MG tablet Take 40 mg by mouth daily.  Marland Kitchen CALCIUM PO Take 1,000 mg by mouth 2 (two) times daily.   . cholecalciferol (VITAMIN D) 1000 units tablet Take 1,000 Units by mouth 2 (two) times daily.   Marland Kitchen denosumab (PROLIA) 60 MG/ML SOSY injection Inject 60 mg into the skin every 6 (six) months.  Marland Kitchen ELIQUIS 5 MG TABS tablet Take 5 mg by mouth 2 (two) times daily.  Marland Kitchen levothyroxine (SYNTHROID, LEVOTHROID) 88 MCG tablet Take 88 mcg by mouth daily before breakfast.  .  MELATONIN PO Take 10 mg by mouth at bedtime as needed.   . Multiple Vitamin (MULTIVITAMIN) tablet Take 1 tablet by mouth daily.  . pantoprazole (PROTONIX) 40 MG tablet Take 40 mg by mouth 2 (two) times daily.  . Probiotic Product (PROBIOTIC DAILY PO) Take 1 tablet by mouth daily.  . sertraline (ZOLOFT) 50 MG tablet Take 50 mg by mouth daily.   Marland Kitchen tolterodine (DETROL LA) 4 MG 24 hr capsule Take 4 mg by mouth daily.  . vitamin B-12 (CYANOCOBALAMIN) 1000 MCG tablet Take 1,000 mcg by mouth daily.  . vitamin C (ASCORBIC ACID) 500 MG tablet Take 500 mg by mouth daily.     Allergies:   Fosamax [alendronate sodium]   Social History   Socioeconomic History  . Marital status: Married    Spouse name: Joe   . Number of children: 4  . Years of education: 16+  . Highest education level: Not on file  Occupational History  . Not on file  Tobacco Use  . Smoking status: Never Smoker  . Smokeless tobacco: Never Used  Vaping Use  . Vaping Use: Never used  Substance and Sexual Activity  . Alcohol use: Yes    Alcohol/week: 0.0 standard drinks    Comment: Very rarley   . Drug use: No  . Sexual activity: Not on  file    Comment: widowed, 2 daughters and 1 son. Retired Education officer, museum  Other Topics Concern  . Not on file  Social History Narrative   Lives at home with husband   Caffeine use:  2 cups coffee every morning   Occass [employer] and tea   Social Determinants of Health   Financial Resource Strain: Not on file  Food Insecurity: Not on file  Transportation Needs: Not on file  Physical Activity: Not on file  Stress: Not on file  Social Connections: Not on file     Family History: The patient's family history includes Anuerysm in her daughter; Cancer in her maternal aunt; Heart disease in her mother; Lupus in her daughter; Throat cancer in her maternal aunt.  ROS:   Please see the history of present illness.     All other systems reviewed and are negative.  EKGs/Labs/Other Studies  Reviewed:    The following studies were reviewed today:  ECHO 2022:  1. Left ventricular ejection fraction, by estimation, is 65 to 70%. The  left ventricle has normal function. The left ventricle has no regional  wall motion abnormalities. Left ventricular diastolic parameters are  consistent with Grade I diastolic  dysfunction (impaired relaxation).  2. Right ventricular systolic function is normal. The right ventricular  size is normal.  3. The mitral valve is abnormal. Trivial mitral valve regurgitation.  4. The aortic valve is tricuspid. Aortic valve regurgitation is not  visualized. Mild aortic valve sclerosis is present, with no evidence of  aortic valve stenosis.  5. The inferior vena cava IVC appears spontaneously collapsed, suggestive  of volume depletion and a low RA pressure <3 mmHg.   Conclusion(s)/Recommendation(s): Consider volume depletion as possible  etiology of near-syncope.   EKG: See below for details  Recent Labs: 12/18/2020: ALT 22; BUN 18; Creatinine, Ser 0.92; Hemoglobin 13.0; Platelets 247; Potassium 3.8; Sodium 139  Recent Lipid Panel No results found for: CHOL, TRIG, HDL, CHOLHDL, VLDL, LDLCALC, LDLDIRECT   Risk Assessment/Calculations:      Physical Exam:    VS:  BP 100/60 (BP Location: Left Arm, Patient Position: Sitting, Cuff Size: Normal)   Pulse 78   Ht 5\' 6"  (1.676 m)   Wt 158 lb (71.7 kg)   SpO2 96%   BMI 25.50 kg/m     Wt Readings from Last 3 Encounters:  03/07/21 158 lb (71.7 kg)  12/19/20 160 lb (72.6 kg)  04/18/20 163 lb (73.9 kg)     GEN: Thin in no acute distress HEENT: Normal NECK: No JVD; No carotid bruits LYMPHATICS: No lymphadenopathy CARDIAC: RRR, no murmurs, rubs, gallops RESPIRATORY:  Clear to auscultation without rales, wheezing or rhonchi  ABDOMEN: Soft, non-tender, non-distended MUSCULOSKELETAL:  No edema; No deformity  SKIN: Warm and dry NEUROLOGIC:  Alert and oriented x 3 PSYCHIATRIC:  Normal affect    ASSESSMENT:    No diagnosis found. PLAN:    In order of problems listed above:  Near syncope Echocardiogram reassuring.  Monitor reassuring.  No evidence of adverse arrhythmias.  Likely result of dehydration.  Continue to hydrate well.  No further issues.  Palpitations Sinus rhythm with average of 73 bpm  SVT fastest for 6 beats at 222 bpm, longest 24 seconds at 107 bpm  Occasional PVC's and rare PAC  No atrial fibrillation, no pauses   No electrocardiographic reasons for syncope  Paroxysmal atrial fibrillation - Personally reviewed and interpreted EKG from 09/20/2020 which clearly showed atrial fibrillation with heart rate of 118  bpm at Dr. Lambert Mody office.  Continue with anticoagulation.  Chronic anticoagulation - Continue with Eliquis.  Watch for any signs of bleeding.  Medication monitoring.  Lab monitoring.  Hemoglobin 13.0 creatinine 0.9 from outside labs  1 yr  Medication Adjustments/Labs and Tests Ordered: Current medicines are reviewed at length with the patient today.  Concerns regarding medicines are outlined above.  No orders of the defined types were placed in this encounter.  No orders of the defined types were placed in this encounter.   There are no Patient Instructions on file for this visit.   Signed, Candee Furbish, MD  03/07/2021 2:46 PM    [place]

## 2021-03-13 ENCOUNTER — Emergency Department (HOSPITAL_BASED_OUTPATIENT_CLINIC_OR_DEPARTMENT_OTHER)
Admission: EM | Admit: 2021-03-13 | Discharge: 2021-03-13 | Disposition: A | Discharge status: Home / Self Care | Payer: Medicare PPO | Attending: Emergency Medicine | Admitting: Emergency Medicine

## 2021-03-13 ENCOUNTER — Other Ambulatory Visit: Payer: Self-pay

## 2021-03-13 ENCOUNTER — Emergency Department (HOSPITAL_BASED_OUTPATIENT_CLINIC_OR_DEPARTMENT_OTHER): Payer: Medicare PPO

## 2021-03-13 ENCOUNTER — Encounter (HOSPITAL_BASED_OUTPATIENT_CLINIC_OR_DEPARTMENT_OTHER): Payer: Self-pay

## 2021-03-13 DIAGNOSIS — Z043 Encounter for examination and observation following other accident: Secondary | ICD-10-CM | POA: Diagnosis not present

## 2021-03-13 DIAGNOSIS — Z79899 Other long term (current) drug therapy: Secondary | ICD-10-CM | POA: Diagnosis not present

## 2021-03-13 DIAGNOSIS — Y9389 Activity, other specified: Secondary | ICD-10-CM | POA: Diagnosis not present

## 2021-03-13 DIAGNOSIS — S0990XA Unspecified injury of head, initial encounter: Secondary | ICD-10-CM | POA: Insufficient documentation

## 2021-03-13 DIAGNOSIS — W1789XA Other fall from one level to another, initial encounter: Secondary | ICD-10-CM | POA: Insufficient documentation

## 2021-03-13 DIAGNOSIS — E039 Hypothyroidism, unspecified: Secondary | ICD-10-CM | POA: Insufficient documentation

## 2021-03-13 DIAGNOSIS — G319 Degenerative disease of nervous system, unspecified: Secondary | ICD-10-CM | POA: Diagnosis not present

## 2021-03-13 DIAGNOSIS — Z7901 Long term (current) use of anticoagulants: Secondary | ICD-10-CM | POA: Diagnosis not present

## 2021-03-13 NOTE — Discharge Instructions (Addendum)
If you develop new or worsening headache, vomiting, vision changes, slurred speech, or any other new/concerning symptoms then return to the ER for evaluation as you may have delayed head bleeding.

## 2021-03-13 NOTE — ED Provider Notes (Signed)
[place] EMERGENCY DEPARTMENT Provider Note   CSN: [id number] Arrival date & time: 03/13/21  1923     History Chief Complaint  Patient presents with  . [place]on is a 87 y.o. female.  HPI 87 year old presents after minor head injury.  She was in a tow truck and when they got to her house she slipped getting out of the tow truck and hit her head on the step.  Did not lose consciousness.  She was actually caught as she was falling and so she only had a mild head injury.  Denies headache, swelling or neck pain.  No vomiting or loss of consciousness.  No other injuries.  She is on Eliquis so she wants to make sure her head is okay.  Past Medical History:  Diagnosis Date  . Bladder incontinence   . Depression   . GERD (gastroesophageal reflux disease)   . High cholesterol   . Hypothyroid   . Recurrent falls   . Stroke [facility])     Patient Active Problem List   Diagnosis Date Noted  . Intractable back pain 12/22/2018  . Hypothyroid 12/22/2018  . Hyperglycemia 12/22/2018  . Recurrent falls 04/27/2018  . Small fiber neuropathy 09/03/2016  . Compression fracture of thoracic vertebra (HCC) 06/21/2016  . MGUS (monoclonal gammopathy of unknown significance) 02/16/2016  . Quality of life palliative care encounter 02/16/2016  . Elevated blood pressure reading 02/16/2016  . Polyneuropathy 01/15/2016  . Neuropathy 01/10/2016  . Occipital stroke ([place]) 02/21/2015    Past Surgical History:  Procedure Laterality Date  . CARPAL TUNNEL RELEASE    . CATARACT EXTRACTION Bilateral   . EYE SURGERY Left 02/15/15    Laser surgery      OB History   No obstetric history on file.     Family History  Problem Relation Age of Onset  . Heart disease Mother   . Throat cancer Maternal Aunt   . Cancer Maternal Aunt        breast ca  . Lupus Daughter   . Anuerysm Daughter        Brain    Social History   Tobacco Use  . Smoking status: Never Smoker  . Smokeless  tobacco: Never Used  Vaping Use  . Vaping Use: Never used  Substance Use Topics  . Alcohol use: Yes    Alcohol/week: 0.0 standard drinks    Comment: Very rarley   . Drug use: No    Home Medications Prior to Admission medications   Medication Sig Start Date End Date Taking? Authorizing Provider  atorvastatin (LIPITOR) 40 MG tablet Take 40 mg by mouth daily. 09/26/20   [provider]  CALCIUM PO Take 1,000 mg by mouth 2 (two) times daily.     [provider]  cholecalciferol (VITAMIN D) 1000 units tablet Take 1,000 Units by mouth 2 (two) times daily.     [provider]  denosumab (PROLIA) 60 MG/ML SOSY injection Inject 60 mg into the skin every 6 (six) months.    [provider]  ELIQUIS 5 MG TABS tablet Take 5 mg by mouth 2 (two) times daily. 09/22/20   [provider]  levothyroxine (SYNTHROID, LEVOTHROID) 88 MCG tablet Take 88 mcg by mouth daily before breakfast.    [provider]  MELATONIN PO Take 10 mg by mouth at bedtime as needed.     [provider]  Multiple Vitamin (MULTIVITAMIN) tablet Take 1 tablet  by mouth daily.    [provider]  pantoprazole (PROTONIX) 40 MG tablet Take 40 mg by mouth 2 (two) times daily. 12/06/20   [provider]  Probiotic Product (PROBIOTIC DAILY PO) Take 1 tablet by mouth daily.    [provider]  sertraline (ZOLOFT) 50 MG tablet Take 50 mg by mouth daily.     [provider]  tolterodine (DETROL LA) 4 MG 24 hr capsule Take 4 mg by mouth daily. 12/13/18   [provider]  vitamin B-12 (CYANOCOBALAMIN) 1000 MCG tablet Take 1,000 mcg by mouth daily.    [provider]  vitamin C (ASCORBIC ACID) 500 MG tablet Take 500 mg by mouth daily.    [provider]    Allergies    Fosamax [alendronate sodium]  Review of Systems   Review of Systems  Musculoskeletal: Negative for arthralgias, back pain and neck pain.  Neurological:  Negative for syncope and headaches.  All other systems reviewed and are negative.   Physical Exam Updated Vital Signs BP (!) 141/76 (BP Location: Left Arm)   Pulse 79   Temp 98.8 F (37.1 C) (Oral)   Resp 20   Ht 5\' 6"  (1.676 m)   Wt 71.7 kg   SpO2 96%   BMI 25.50 kg/m   Physical Exam Vitals and nursing note reviewed.  Constitutional:      General: She is not in acute distress.    Appearance: She is well-developed. She is not ill-appearing or diaphoretic.  HENT:     Head: Normocephalic and atraumatic.     Right Ear: External ear normal.     Left Ear: External ear normal.     Nose: Nose normal.  Eyes:     General:        Right eye: No discharge.        Left eye: No discharge.     Extraocular Movements: Extraocular movements intact.     Pupils: Pupils are equal, round, and reactive to light.  Cardiovascular:     Rate and Rhythm: Normal rate and regular rhythm.     Heart sounds: Normal heart sounds.  Pulmonary:     Effort: Pulmonary effort is normal.     Breath sounds: Normal breath sounds.  Abdominal:     Palpations: Abdomen is soft.     Tenderness: There is no abdominal tenderness.  Musculoskeletal:     Cervical back: Neck supple. No tenderness.  Skin:    General: Skin is warm and dry.  Neurological:     Mental Status: She is alert.     Comments: CN 3-12 grossly intact. 5/5 strength in all 4 extremities. Grossly normal sensation. Normal finger to nose.   Psychiatric:        Mood and Affect: Mood is not anxious.     ED Results / Procedures / Treatments   Labs (all labs ordered are listed, but only abnormal results are displayed) Labs Reviewed - No data to display  EKG None  Radiology CT Head Wo Contrast  Result Date: 03/13/2021 CLINICAL DATA:  Status post fall. EXAM: CT HEAD WITHOUT CONTRAST TECHNIQUE: Contiguous axial images were obtained from the base of the skull through the vertex without intravenous contrast. COMPARISON:  December 18, 2020 FINDINGS:  Brain: There is mild cerebral atrophy with widening of the extra-axial spaces and ventricular dilatation. There are areas of decreased attenuation within the white matter tracts of the supratentorial brain, consistent with microvascular disease changes. Vascular: No hyperdense vessel  or unexpected calcification. Skull: Normal. Negative for fracture or focal lesion. Sinuses/Orbits: No acute finding. Other: None. IMPRESSION: 1. Generalized cerebral atrophy. 2. No acute intracranial abnormality. Electronically Signed   By: Virgina Norfolk M.D.   On: 03/13/2021 20:41    Procedures Procedures   Medications Ordered in ED Medications - No data to display  ED Course  I have reviewed the triage vital signs and the nursing notes.  Pertinent labs & imaging results that were available during my care of the patient were reviewed by me and considered in my medical decision making (see chart for details).    MDM Rules/Calculators/A&P                          Patient is well-appearing and her head CT is benign.  No significant findings on exam.  We discussed return precautions in case over to be delayed bleeding but right now she looks fine and stable for discharge home. Final Clinical Impression(s) / ED Diagnoses Final diagnoses:  Minor head injury, initial encounter    Rx / DC Orders ED Discharge Orders    None       Sherwood Gambler, MD 03/13/21 2148

## 2021-03-13 NOTE — ED Notes (Signed)
Patient transported to X-ray 

## 2021-03-13 NOTE — ED Triage Notes (Signed)
Patient states she fell and hit her head when getting out of a tow truck. Is on blood thinners, denies loss of consciousness.

## 2021-04-17 DIAGNOSIS — Z1231 Encounter for screening mammogram for malignant neoplasm of breast: Secondary | ICD-10-CM | POA: Diagnosis not present

## 2021-07-11 DIAGNOSIS — M810 Age-related osteoporosis without current pathological fracture: Secondary | ICD-10-CM | POA: Diagnosis not present

## 2021-08-22 DIAGNOSIS — Z85828 Personal history of other malignant neoplasm of skin: Secondary | ICD-10-CM | POA: Diagnosis not present

## 2021-08-22 DIAGNOSIS — D485 Neoplasm of uncertain behavior of skin: Secondary | ICD-10-CM | POA: Diagnosis not present

## 2021-08-22 DIAGNOSIS — L570 Actinic keratosis: Secondary | ICD-10-CM | POA: Diagnosis not present

## 2021-09-13 DIAGNOSIS — W19XXXA Unspecified fall, initial encounter: Secondary | ICD-10-CM | POA: Diagnosis not present

## 2021-09-13 DIAGNOSIS — R32 Unspecified urinary incontinence: Secondary | ICD-10-CM | POA: Diagnosis not present

## 2021-09-13 DIAGNOSIS — J4521 Mild intermittent asthma with (acute) exacerbation: Secondary | ICD-10-CM | POA: Diagnosis not present

## 2021-09-13 DIAGNOSIS — R059 Cough, unspecified: Secondary | ICD-10-CM | POA: Diagnosis not present

## 2021-09-13 DIAGNOSIS — M898X8 Other specified disorders of bone, other site: Secondary | ICD-10-CM | POA: Diagnosis not present

## 2021-10-15 DIAGNOSIS — I4891 Unspecified atrial fibrillation: Secondary | ICD-10-CM | POA: Diagnosis not present

## 2021-10-15 DIAGNOSIS — G629 Polyneuropathy, unspecified: Secondary | ICD-10-CM | POA: Diagnosis not present

## 2021-10-15 DIAGNOSIS — M898X8 Other specified disorders of bone, other site: Secondary | ICD-10-CM | POA: Diagnosis not present

## 2021-10-15 DIAGNOSIS — J4521 Mild intermittent asthma with (acute) exacerbation: Secondary | ICD-10-CM | POA: Diagnosis not present

## 2021-10-22 DIAGNOSIS — E039 Hypothyroidism, unspecified: Secondary | ICD-10-CM | POA: Diagnosis not present

## 2021-10-22 DIAGNOSIS — Z0001 Encounter for general adult medical examination with abnormal findings: Secondary | ICD-10-CM | POA: Diagnosis not present

## 2021-10-22 DIAGNOSIS — M810 Age-related osteoporosis without current pathological fracture: Secondary | ICD-10-CM | POA: Diagnosis not present

## 2021-10-25 DIAGNOSIS — F419 Anxiety disorder, unspecified: Secondary | ICD-10-CM | POA: Diagnosis not present

## 2021-10-25 DIAGNOSIS — D472 Monoclonal gammopathy: Secondary | ICD-10-CM | POA: Diagnosis not present

## 2021-10-25 DIAGNOSIS — E039 Hypothyroidism, unspecified: Secondary | ICD-10-CM | POA: Diagnosis not present

## 2021-10-25 DIAGNOSIS — R32 Unspecified urinary incontinence: Secondary | ICD-10-CM | POA: Diagnosis not present

## 2021-10-25 DIAGNOSIS — I4891 Unspecified atrial fibrillation: Secondary | ICD-10-CM | POA: Diagnosis not present

## 2021-10-25 DIAGNOSIS — K219 Gastro-esophageal reflux disease without esophagitis: Secondary | ICD-10-CM | POA: Diagnosis not present

## 2021-10-25 DIAGNOSIS — Z0000 Encounter for general adult medical examination without abnormal findings: Secondary | ICD-10-CM | POA: Diagnosis not present

## 2021-10-25 DIAGNOSIS — Z8619 Personal history of other infectious and parasitic diseases: Secondary | ICD-10-CM | POA: Diagnosis not present

## 2021-10-25 DIAGNOSIS — R053 Chronic cough: Secondary | ICD-10-CM | POA: Diagnosis not present

## 2021-10-25 DIAGNOSIS — I6523 Occlusion and stenosis of bilateral carotid arteries: Secondary | ICD-10-CM | POA: Diagnosis not present

## 2021-11-03 DIAGNOSIS — M5459 Other low back pain: Secondary | ICD-10-CM | POA: Diagnosis not present

## 2021-11-03 DIAGNOSIS — M25532 Pain in left wrist: Secondary | ICD-10-CM | POA: Diagnosis not present

## 2021-11-03 DIAGNOSIS — R079 Chest pain, unspecified: Secondary | ICD-10-CM | POA: Diagnosis not present

## 2021-11-03 DIAGNOSIS — W19XXXA Unspecified fall, initial encounter: Secondary | ICD-10-CM | POA: Diagnosis not present

## 2021-11-03 DIAGNOSIS — K4090 Unilateral inguinal hernia, without obstruction or gangrene, not specified as recurrent: Secondary | ICD-10-CM | POA: Diagnosis not present

## 2021-11-03 DIAGNOSIS — S22078A Other fracture of T9-T10 vertebra, initial encounter for closed fracture: Secondary | ICD-10-CM | POA: Diagnosis not present

## 2021-11-03 DIAGNOSIS — M19042 Primary osteoarthritis, left hand: Secondary | ICD-10-CM | POA: Diagnosis not present

## 2021-11-03 DIAGNOSIS — Y998 Other external cause status: Secondary | ICD-10-CM | POA: Diagnosis not present

## 2021-11-03 DIAGNOSIS — S0003XA Contusion of scalp, initial encounter: Secondary | ICD-10-CM | POA: Diagnosis not present

## 2021-11-03 DIAGNOSIS — S0990XA Unspecified injury of head, initial encounter: Secondary | ICD-10-CM | POA: Diagnosis not present

## 2021-11-03 DIAGNOSIS — M542 Cervicalgia: Secondary | ICD-10-CM | POA: Diagnosis not present

## 2021-11-03 DIAGNOSIS — W228XXA Striking against or struck by other objects, initial encounter: Secondary | ICD-10-CM | POA: Diagnosis not present

## 2021-11-03 DIAGNOSIS — K8020 Calculus of gallbladder without cholecystitis without obstruction: Secondary | ICD-10-CM | POA: Diagnosis not present

## 2021-11-03 DIAGNOSIS — S22088A Other fracture of T11-T12 vertebra, initial encounter for closed fracture: Secondary | ICD-10-CM | POA: Diagnosis not present

## 2021-11-03 DIAGNOSIS — J9811 Atelectasis: Secondary | ICD-10-CM | POA: Diagnosis not present

## 2021-11-03 DIAGNOSIS — M8589 Other specified disorders of bone density and structure, multiple sites: Secondary | ICD-10-CM | POA: Diagnosis not present

## 2021-11-03 DIAGNOSIS — M79642 Pain in left hand: Secondary | ICD-10-CM | POA: Diagnosis not present

## 2021-11-03 DIAGNOSIS — M11242 Other chondrocalcinosis, left hand: Secondary | ICD-10-CM | POA: Diagnosis not present

## 2021-11-03 DIAGNOSIS — M19032 Primary osteoarthritis, left wrist: Secondary | ICD-10-CM | POA: Diagnosis not present

## 2021-11-03 DIAGNOSIS — S301XXA Contusion of abdominal wall, initial encounter: Secondary | ICD-10-CM | POA: Diagnosis not present

## 2021-11-16 ENCOUNTER — Telehealth: Payer: Self-pay | Admitting: Cardiology

## 2021-11-16 NOTE — Telephone Encounter (Signed)
Spoke with dtr. Reports pt has fallen at least 5 times in last 6 months. Recently went to [facility] after a fall and they told them to discuss stopping DOAC d/t falls and that the risks out weight benefit at this point. Aware will forward to MD to review/advise. Aware his RN will follow up with them next week w/ recommendation. Dtr verbalized understanding and agreeable to plan.

## 2021-11-16 NOTE — Telephone Encounter (Signed)
Pt c/o medication issue:  1. Name of Medication: ELIQUIS 5 MG TABS tablet  2. How are you currently taking this medication (dosage and times per day)? Take 5 mg by mouth 2 (two) times daily  3. Are you having a reaction (difficulty breathing--STAT)? No   4. What is your medication issue? [place] thinks that the patient should come off of Eliquis. Patient has constantly fallen over the course of 6 months. Patient's daughter would like to see if Dr. Marlou Porch agrees with the patient coming off of Eliquis.

## 2021-11-16 NOTE — Telephone Encounter (Signed)
Pt c/o medication issue:   1. Name of Medication: ELIQUIS 5 MG TABS tablet   2. How are you currently taking this medication (dosage and times per day)? Take 5 mg by mouth 2 (two) times daily   3. Are you having a reaction (difficulty breathing--STAT)? No    4. What is your medication issue? [place] thinks that the patient should come off of Eliquis. Patient has constantly fallen over the course of 6 months. Patient's daughter would like to see if Dr. Marlou Porch agrees with the patient coming off of Eliquis.

## 2021-11-19 NOTE — Telephone Encounter (Signed)
Appt scheduled for 2/23 at 9:30 am with Dr Marlou Porch.

## 2021-11-29 ENCOUNTER — Ambulatory Visit: Payer: Medicare PPO | Admitting: Cardiology

## 2022-04-01 DIAGNOSIS — L820 Inflamed seborrheic keratosis: Secondary | ICD-10-CM | POA: Diagnosis not present

## 2022-04-01 DIAGNOSIS — L570 Actinic keratosis: Secondary | ICD-10-CM | POA: Diagnosis not present

## 2022-04-01 DIAGNOSIS — D485 Neoplasm of uncertain behavior of skin: Secondary | ICD-10-CM | POA: Diagnosis not present

## 2022-04-01 DIAGNOSIS — Z85828 Personal history of other malignant neoplasm of skin: Secondary | ICD-10-CM | POA: Diagnosis not present

## 2022-04-01 DIAGNOSIS — C44311 Basal cell carcinoma of skin of nose: Secondary | ICD-10-CM | POA: Diagnosis not present

## 2022-05-01 DIAGNOSIS — M545 Low back pain, unspecified: Secondary | ICD-10-CM | POA: Diagnosis not present

## 2022-05-10 DIAGNOSIS — W010XXA Fall on same level from slipping, tripping and stumbling without subsequent striking against object, initial encounter: Secondary | ICD-10-CM | POA: Diagnosis not present

## 2022-05-10 DIAGNOSIS — I2699 Other pulmonary embolism without acute cor pulmonale: Secondary | ICD-10-CM | POA: Diagnosis not present

## 2022-05-10 DIAGNOSIS — Z8679 Personal history of other diseases of the circulatory system: Secondary | ICD-10-CM | POA: Diagnosis not present

## 2022-05-10 DIAGNOSIS — I2694 Multiple subsegmental pulmonary emboli without acute cor pulmonale: Secondary | ICD-10-CM | POA: Diagnosis not present

## 2022-05-10 DIAGNOSIS — N3090 Cystitis, unspecified without hematuria: Secondary | ICD-10-CM | POA: Diagnosis not present

## 2022-05-10 DIAGNOSIS — I480 Paroxysmal atrial fibrillation: Secondary | ICD-10-CM | POA: Diagnosis not present

## 2022-05-10 DIAGNOSIS — Y998 Other external cause status: Secondary | ICD-10-CM | POA: Diagnosis not present

## 2022-05-10 DIAGNOSIS — K8020 Calculus of gallbladder without cholecystitis without obstruction: Secondary | ICD-10-CM | POA: Diagnosis not present

## 2022-05-10 DIAGNOSIS — S22070A Wedge compression fracture of T9-T10 vertebra, initial encounter for closed fracture: Secondary | ICD-10-CM | POA: Diagnosis not present

## 2022-05-10 DIAGNOSIS — Z20822 Contact with and (suspected) exposure to covid-19: Secondary | ICD-10-CM | POA: Diagnosis not present

## 2022-05-10 DIAGNOSIS — M4854XA Collapsed vertebra, not elsewhere classified, thoracic region, initial encounter for fracture: Secondary | ICD-10-CM | POA: Diagnosis not present

## 2022-05-10 DIAGNOSIS — Z043 Encounter for examination and observation following other accident: Secondary | ICD-10-CM | POA: Diagnosis not present

## 2022-05-10 DIAGNOSIS — J9601 Acute respiratory failure with hypoxia: Secondary | ICD-10-CM | POA: Diagnosis not present

## 2022-05-10 DIAGNOSIS — N390 Urinary tract infection, site not specified: Secondary | ICD-10-CM | POA: Diagnosis not present

## 2022-05-10 DIAGNOSIS — Z66 Do not resuscitate: Secondary | ICD-10-CM | POA: Diagnosis not present

## 2022-05-10 DIAGNOSIS — S32020A Wedge compression fracture of second lumbar vertebra, initial encounter for closed fracture: Secondary | ICD-10-CM | POA: Diagnosis not present

## 2022-05-10 DIAGNOSIS — N281 Cyst of kidney, acquired: Secondary | ICD-10-CM | POA: Diagnosis not present

## 2022-05-10 DIAGNOSIS — K5900 Constipation, unspecified: Secondary | ICD-10-CM | POA: Diagnosis not present

## 2022-05-10 DIAGNOSIS — S22080A Wedge compression fracture of T11-T12 vertebra, initial encounter for closed fracture: Secondary | ICD-10-CM | POA: Diagnosis not present

## 2022-05-10 DIAGNOSIS — I639 Cerebral infarction, unspecified: Secondary | ICD-10-CM | POA: Diagnosis not present

## 2022-05-10 DIAGNOSIS — M4856XA Collapsed vertebra, not elsewhere classified, lumbar region, initial encounter for fracture: Secondary | ICD-10-CM | POA: Diagnosis not present

## 2022-05-11 DIAGNOSIS — M4856XA Collapsed vertebra, not elsewhere classified, lumbar region, initial encounter for fracture: Secondary | ICD-10-CM | POA: Diagnosis not present

## 2022-05-11 DIAGNOSIS — E785 Hyperlipidemia, unspecified: Secondary | ICD-10-CM | POA: Diagnosis not present

## 2022-05-11 DIAGNOSIS — I081 Rheumatic disorders of both mitral and tricuspid valves: Secondary | ICD-10-CM | POA: Diagnosis not present

## 2022-05-11 DIAGNOSIS — M4854XA Collapsed vertebra, not elsewhere classified, thoracic region, initial encounter for fracture: Secondary | ICD-10-CM | POA: Diagnosis not present

## 2022-05-11 DIAGNOSIS — R296 Repeated falls: Secondary | ICD-10-CM | POA: Diagnosis not present

## 2022-05-11 DIAGNOSIS — N390 Urinary tract infection, site not specified: Secondary | ICD-10-CM | POA: Diagnosis not present

## 2022-05-11 DIAGNOSIS — S22080A Wedge compression fracture of T11-T12 vertebra, initial encounter for closed fracture: Secondary | ICD-10-CM | POA: Diagnosis not present

## 2022-05-11 DIAGNOSIS — I639 Cerebral infarction, unspecified: Secondary | ICD-10-CM | POA: Diagnosis not present

## 2022-05-11 DIAGNOSIS — Z20822 Contact with and (suspected) exposure to covid-19: Secondary | ICD-10-CM | POA: Diagnosis not present

## 2022-05-11 DIAGNOSIS — Z961 Presence of intraocular lens: Secondary | ICD-10-CM | POA: Diagnosis not present

## 2022-05-11 DIAGNOSIS — N3090 Cystitis, unspecified without hematuria: Secondary | ICD-10-CM | POA: Diagnosis not present

## 2022-05-11 DIAGNOSIS — I7090 Unspecified atherosclerosis: Secondary | ICD-10-CM | POA: Diagnosis not present

## 2022-05-11 DIAGNOSIS — J9601 Acute respiratory failure with hypoxia: Secondary | ICD-10-CM | POA: Diagnosis not present

## 2022-05-11 DIAGNOSIS — K5900 Constipation, unspecified: Secondary | ICD-10-CM | POA: Diagnosis not present

## 2022-05-11 DIAGNOSIS — I272 Pulmonary hypertension, unspecified: Secondary | ICD-10-CM | POA: Diagnosis not present

## 2022-05-11 DIAGNOSIS — I2699 Other pulmonary embolism without acute cor pulmonale: Secondary | ICD-10-CM | POA: Diagnosis not present

## 2022-05-11 DIAGNOSIS — S32020A Wedge compression fracture of second lumbar vertebra, initial encounter for closed fracture: Secondary | ICD-10-CM | POA: Diagnosis not present

## 2022-05-11 DIAGNOSIS — Z66 Do not resuscitate: Secondary | ICD-10-CM | POA: Diagnosis not present

## 2022-05-11 DIAGNOSIS — E039 Hypothyroidism, unspecified: Secondary | ICD-10-CM | POA: Diagnosis not present

## 2022-05-11 DIAGNOSIS — I480 Paroxysmal atrial fibrillation: Secondary | ICD-10-CM | POA: Diagnosis not present

## 2022-05-11 DIAGNOSIS — I2694 Multiple subsegmental pulmonary emboli without acute cor pulmonale: Secondary | ICD-10-CM | POA: Diagnosis not present

## 2022-05-11 DIAGNOSIS — Z8679 Personal history of other diseases of the circulatory system: Secondary | ICD-10-CM | POA: Diagnosis not present

## 2022-05-12 DIAGNOSIS — I480 Paroxysmal atrial fibrillation: Secondary | ICD-10-CM | POA: Diagnosis not present

## 2022-05-12 DIAGNOSIS — Z8679 Personal history of other diseases of the circulatory system: Secondary | ICD-10-CM | POA: Diagnosis not present

## 2022-05-12 DIAGNOSIS — K5900 Constipation, unspecified: Secondary | ICD-10-CM | POA: Diagnosis not present

## 2022-05-12 DIAGNOSIS — M4854XA Collapsed vertebra, not elsewhere classified, thoracic region, initial encounter for fracture: Secondary | ICD-10-CM | POA: Diagnosis not present

## 2022-05-12 DIAGNOSIS — S22080A Wedge compression fracture of T11-T12 vertebra, initial encounter for closed fracture: Secondary | ICD-10-CM | POA: Diagnosis not present

## 2022-05-12 DIAGNOSIS — N3090 Cystitis, unspecified without hematuria: Secondary | ICD-10-CM | POA: Diagnosis not present

## 2022-05-12 DIAGNOSIS — N390 Urinary tract infection, site not specified: Secondary | ICD-10-CM | POA: Diagnosis not present

## 2022-05-12 DIAGNOSIS — I2699 Other pulmonary embolism without acute cor pulmonale: Secondary | ICD-10-CM | POA: Diagnosis not present

## 2022-05-12 DIAGNOSIS — M4856XA Collapsed vertebra, not elsewhere classified, lumbar region, initial encounter for fracture: Secondary | ICD-10-CM | POA: Diagnosis not present

## 2022-05-12 DIAGNOSIS — J9601 Acute respiratory failure with hypoxia: Secondary | ICD-10-CM | POA: Diagnosis not present

## 2022-05-12 DIAGNOSIS — E039 Hypothyroidism, unspecified: Secondary | ICD-10-CM | POA: Diagnosis not present

## 2022-05-12 DIAGNOSIS — I2694 Multiple subsegmental pulmonary emboli without acute cor pulmonale: Secondary | ICD-10-CM | POA: Diagnosis not present

## 2022-05-12 DIAGNOSIS — Z20822 Contact with and (suspected) exposure to covid-19: Secondary | ICD-10-CM | POA: Diagnosis not present

## 2022-05-12 DIAGNOSIS — Z66 Do not resuscitate: Secondary | ICD-10-CM | POA: Diagnosis not present

## 2022-05-12 DIAGNOSIS — S32020A Wedge compression fracture of second lumbar vertebra, initial encounter for closed fracture: Secondary | ICD-10-CM | POA: Diagnosis not present

## 2022-05-13 DIAGNOSIS — Z8673 Personal history of transient ischemic attack (TIA), and cerebral infarction without residual deficits: Secondary | ICD-10-CM | POA: Diagnosis not present

## 2022-05-13 DIAGNOSIS — S32020A Wedge compression fracture of second lumbar vertebra, initial encounter for closed fracture: Secondary | ICD-10-CM | POA: Diagnosis not present

## 2022-05-13 DIAGNOSIS — Z7409 Other reduced mobility: Secondary | ICD-10-CM | POA: Diagnosis not present

## 2022-05-13 DIAGNOSIS — S22089A Unspecified fracture of T11-T12 vertebra, initial encounter for closed fracture: Secondary | ICD-10-CM | POA: Diagnosis not present

## 2022-05-13 DIAGNOSIS — Z7401 Bed confinement status: Secondary | ICD-10-CM | POA: Diagnosis not present

## 2022-05-13 DIAGNOSIS — I639 Cerebral infarction, unspecified: Secondary | ICD-10-CM | POA: Diagnosis not present

## 2022-05-13 DIAGNOSIS — M8008XA Age-related osteoporosis with current pathological fracture, vertebra(e), initial encounter for fracture: Secondary | ICD-10-CM | POA: Diagnosis not present

## 2022-05-13 DIAGNOSIS — S22079D Unspecified fracture of T9-T10 vertebra, subsequent encounter for fracture with routine healing: Secondary | ICD-10-CM | POA: Diagnosis not present

## 2022-05-13 DIAGNOSIS — R531 Weakness: Secondary | ICD-10-CM | POA: Diagnosis not present

## 2022-05-13 DIAGNOSIS — E039 Hypothyroidism, unspecified: Secondary | ICD-10-CM | POA: Diagnosis not present

## 2022-05-13 DIAGNOSIS — S32029D Unspecified fracture of second lumbar vertebra, subsequent encounter for fracture with routine healing: Secondary | ICD-10-CM | POA: Diagnosis not present

## 2022-05-13 DIAGNOSIS — S22080A Wedge compression fracture of T11-T12 vertebra, initial encounter for closed fracture: Secondary | ICD-10-CM | POA: Diagnosis not present

## 2022-05-13 DIAGNOSIS — Z86711 Personal history of pulmonary embolism: Secondary | ICD-10-CM | POA: Diagnosis not present

## 2022-05-13 DIAGNOSIS — W19XXXA Unspecified fall, initial encounter: Secondary | ICD-10-CM | POA: Diagnosis not present

## 2022-05-13 DIAGNOSIS — I4891 Unspecified atrial fibrillation: Secondary | ICD-10-CM | POA: Diagnosis not present

## 2022-05-13 DIAGNOSIS — R300 Dysuria: Secondary | ICD-10-CM | POA: Diagnosis not present

## 2022-05-13 DIAGNOSIS — R296 Repeated falls: Secondary | ICD-10-CM | POA: Diagnosis not present

## 2022-05-13 DIAGNOSIS — E785 Hyperlipidemia, unspecified: Secondary | ICD-10-CM | POA: Diagnosis not present

## 2022-05-13 DIAGNOSIS — I2699 Other pulmonary embolism without acute cor pulmonale: Secondary | ICD-10-CM | POA: Diagnosis not present

## 2022-05-13 DIAGNOSIS — S22089D Unspecified fracture of T11-T12 vertebra, subsequent encounter for fracture with routine healing: Secondary | ICD-10-CM | POA: Diagnosis not present

## 2022-05-13 DIAGNOSIS — K5900 Constipation, unspecified: Secondary | ICD-10-CM | POA: Diagnosis not present

## 2022-05-13 DIAGNOSIS — M5137 Other intervertebral disc degeneration, lumbosacral region: Secondary | ICD-10-CM | POA: Diagnosis not present

## 2022-05-13 DIAGNOSIS — R140 Abdominal distension (gaseous): Secondary | ICD-10-CM | POA: Diagnosis not present

## 2022-05-14 DIAGNOSIS — I2699 Other pulmonary embolism without acute cor pulmonale: Secondary | ICD-10-CM | POA: Diagnosis not present

## 2022-05-14 DIAGNOSIS — E039 Hypothyroidism, unspecified: Secondary | ICD-10-CM | POA: Diagnosis not present

## 2022-05-14 DIAGNOSIS — S22089A Unspecified fracture of T11-T12 vertebra, initial encounter for closed fracture: Secondary | ICD-10-CM | POA: Diagnosis not present

## 2022-05-14 DIAGNOSIS — E785 Hyperlipidemia, unspecified: Secondary | ICD-10-CM | POA: Diagnosis not present

## 2022-05-14 DIAGNOSIS — I639 Cerebral infarction, unspecified: Secondary | ICD-10-CM | POA: Diagnosis not present

## 2022-05-14 DIAGNOSIS — S32020A Wedge compression fracture of second lumbar vertebra, initial encounter for closed fracture: Secondary | ICD-10-CM | POA: Diagnosis not present

## 2022-05-14 DIAGNOSIS — W19XXXA Unspecified fall, initial encounter: Secondary | ICD-10-CM | POA: Diagnosis not present

## 2022-05-14 DIAGNOSIS — Z7409 Other reduced mobility: Secondary | ICD-10-CM | POA: Diagnosis not present

## 2022-05-15 DIAGNOSIS — F411 Generalized anxiety disorder: Secondary | ICD-10-CM | POA: Diagnosis not present

## 2022-05-15 DIAGNOSIS — Z8709 Personal history of other diseases of the respiratory system: Secondary | ICD-10-CM | POA: Diagnosis not present

## 2022-05-15 DIAGNOSIS — F32A Depression, unspecified: Secondary | ICD-10-CM | POA: Diagnosis not present

## 2022-05-15 DIAGNOSIS — K5900 Constipation, unspecified: Secondary | ICD-10-CM | POA: Diagnosis not present

## 2022-05-15 DIAGNOSIS — J9600 Acute respiratory failure, unspecified whether with hypoxia or hypercapnia: Secondary | ICD-10-CM | POA: Diagnosis not present

## 2022-05-15 DIAGNOSIS — S32000A Wedge compression fracture of unspecified lumbar vertebra, initial encounter for closed fracture: Secondary | ICD-10-CM | POA: Diagnosis not present

## 2022-05-15 DIAGNOSIS — M818 Other osteoporosis without current pathological fracture: Secondary | ICD-10-CM | POA: Diagnosis not present

## 2022-05-15 DIAGNOSIS — M6281 Muscle weakness (generalized): Secondary | ICD-10-CM | POA: Diagnosis not present

## 2022-05-15 DIAGNOSIS — F4320 Adjustment disorder, unspecified: Secondary | ICD-10-CM | POA: Diagnosis not present

## 2022-05-15 DIAGNOSIS — F5101 Primary insomnia: Secondary | ICD-10-CM | POA: Diagnosis not present

## 2022-05-15 DIAGNOSIS — E039 Hypothyroidism, unspecified: Secondary | ICD-10-CM | POA: Diagnosis not present

## 2022-05-15 DIAGNOSIS — Z7901 Long term (current) use of anticoagulants: Secondary | ICD-10-CM | POA: Diagnosis not present

## 2022-05-15 DIAGNOSIS — R41841 Cognitive communication deficit: Secondary | ICD-10-CM | POA: Diagnosis not present

## 2022-05-15 DIAGNOSIS — R269 Unspecified abnormalities of gait and mobility: Secondary | ICD-10-CM | POA: Diagnosis not present

## 2022-05-15 DIAGNOSIS — S22000D Wedge compression fracture of unspecified thoracic vertebra, subsequent encounter for fracture with routine healing: Secondary | ICD-10-CM | POA: Diagnosis not present

## 2022-05-15 DIAGNOSIS — K219 Gastro-esophageal reflux disease without esophagitis: Secondary | ICD-10-CM | POA: Diagnosis not present

## 2022-05-15 DIAGNOSIS — R2681 Unsteadiness on feet: Secondary | ICD-10-CM | POA: Diagnosis not present

## 2022-05-15 DIAGNOSIS — I2699 Other pulmonary embolism without acute cor pulmonale: Secondary | ICD-10-CM | POA: Diagnosis not present

## 2022-05-15 DIAGNOSIS — J9601 Acute respiratory failure with hypoxia: Secondary | ICD-10-CM | POA: Diagnosis not present

## 2022-05-15 DIAGNOSIS — Z09 Encounter for follow-up examination after completed treatment for conditions other than malignant neoplasm: Secondary | ICD-10-CM | POA: Diagnosis not present

## 2022-05-15 DIAGNOSIS — I480 Paroxysmal atrial fibrillation: Secondary | ICD-10-CM | POA: Diagnosis not present

## 2022-05-15 DIAGNOSIS — S22000A Wedge compression fracture of unspecified thoracic vertebra, initial encounter for closed fracture: Secondary | ICD-10-CM | POA: Diagnosis not present

## 2022-05-16 ENCOUNTER — Ambulatory Visit: Payer: Medicare PPO | Admitting: Cardiology

## 2022-05-16 DIAGNOSIS — S22000A Wedge compression fracture of unspecified thoracic vertebra, initial encounter for closed fracture: Secondary | ICD-10-CM | POA: Diagnosis not present

## 2022-05-16 DIAGNOSIS — M818 Other osteoporosis without current pathological fracture: Secondary | ICD-10-CM | POA: Diagnosis not present

## 2022-05-16 DIAGNOSIS — R269 Unspecified abnormalities of gait and mobility: Secondary | ICD-10-CM | POA: Diagnosis not present

## 2022-05-16 DIAGNOSIS — I2699 Other pulmonary embolism without acute cor pulmonale: Secondary | ICD-10-CM | POA: Diagnosis not present

## 2022-05-16 DIAGNOSIS — J9601 Acute respiratory failure with hypoxia: Secondary | ICD-10-CM | POA: Diagnosis not present

## 2022-05-16 DIAGNOSIS — S32000A Wedge compression fracture of unspecified lumbar vertebra, initial encounter for closed fracture: Secondary | ICD-10-CM | POA: Diagnosis not present

## 2022-05-16 DIAGNOSIS — K5900 Constipation, unspecified: Secondary | ICD-10-CM | POA: Diagnosis not present

## 2022-05-16 DIAGNOSIS — I480 Paroxysmal atrial fibrillation: Secondary | ICD-10-CM | POA: Diagnosis not present

## 2022-05-27 DIAGNOSIS — I480 Paroxysmal atrial fibrillation: Secondary | ICD-10-CM | POA: Diagnosis not present

## 2022-05-27 DIAGNOSIS — Z8709 Personal history of other diseases of the respiratory system: Secondary | ICD-10-CM | POA: Diagnosis not present

## 2022-05-27 DIAGNOSIS — E039 Hypothyroidism, unspecified: Secondary | ICD-10-CM | POA: Diagnosis not present

## 2022-05-27 DIAGNOSIS — K5900 Constipation, unspecified: Secondary | ICD-10-CM | POA: Diagnosis not present

## 2022-05-27 DIAGNOSIS — K219 Gastro-esophageal reflux disease without esophagitis: Secondary | ICD-10-CM | POA: Diagnosis not present

## 2022-05-27 DIAGNOSIS — S22000D Wedge compression fracture of unspecified thoracic vertebra, subsequent encounter for fracture with routine healing: Secondary | ICD-10-CM | POA: Diagnosis not present

## 2022-05-27 DIAGNOSIS — I2699 Other pulmonary embolism without acute cor pulmonale: Secondary | ICD-10-CM | POA: Diagnosis not present

## 2022-05-27 DIAGNOSIS — Z7901 Long term (current) use of anticoagulants: Secondary | ICD-10-CM | POA: Diagnosis not present

## 2022-05-27 DIAGNOSIS — Z09 Encounter for follow-up examination after completed treatment for conditions other than malignant neoplasm: Secondary | ICD-10-CM | POA: Diagnosis not present

## 2022-06-04 DIAGNOSIS — S32000A Wedge compression fracture of unspecified lumbar vertebra, initial encounter for closed fracture: Secondary | ICD-10-CM | POA: Diagnosis not present

## 2022-06-04 DIAGNOSIS — E871 Hypo-osmolality and hyponatremia: Secondary | ICD-10-CM | POA: Diagnosis not present

## 2022-06-06 DIAGNOSIS — E785 Hyperlipidemia, unspecified: Secondary | ICD-10-CM | POA: Diagnosis not present

## 2022-06-06 DIAGNOSIS — J9601 Acute respiratory failure with hypoxia: Secondary | ICD-10-CM | POA: Diagnosis not present

## 2022-06-06 DIAGNOSIS — M8088XD Other osteoporosis with current pathological fracture, vertebra(e), subsequent encounter for fracture with routine healing: Secondary | ICD-10-CM | POA: Diagnosis not present

## 2022-06-29 ENCOUNTER — Emergency Department (HOSPITAL_BASED_OUTPATIENT_CLINIC_OR_DEPARTMENT_OTHER): Payer: Medicare PPO

## 2022-06-29 ENCOUNTER — Encounter (HOSPITAL_BASED_OUTPATIENT_CLINIC_OR_DEPARTMENT_OTHER): Payer: Self-pay | Admitting: Emergency Medicine

## 2022-06-29 ENCOUNTER — Emergency Department (HOSPITAL_BASED_OUTPATIENT_CLINIC_OR_DEPARTMENT_OTHER)
Admission: EM | Admit: 2022-06-29 | Discharge: 2022-06-29 | Disposition: A | Discharge status: Home / Self Care | Payer: Medicare PPO | Attending: Emergency Medicine | Admitting: Emergency Medicine

## 2022-06-29 ENCOUNTER — Other Ambulatory Visit: Payer: Self-pay

## 2022-06-29 DIAGNOSIS — Z20822 Contact with and (suspected) exposure to covid-19: Secondary | ICD-10-CM | POA: Insufficient documentation

## 2022-06-29 DIAGNOSIS — I4891 Unspecified atrial fibrillation: Secondary | ICD-10-CM | POA: Diagnosis not present

## 2022-06-29 DIAGNOSIS — R410 Disorientation, unspecified: Secondary | ICD-10-CM | POA: Diagnosis not present

## 2022-06-29 DIAGNOSIS — R4182 Altered mental status, unspecified: Secondary | ICD-10-CM | POA: Diagnosis not present

## 2022-06-29 DIAGNOSIS — N390 Urinary tract infection, site not specified: Secondary | ICD-10-CM | POA: Diagnosis not present

## 2022-06-29 DIAGNOSIS — R531 Weakness: Secondary | ICD-10-CM | POA: Diagnosis not present

## 2022-06-29 DIAGNOSIS — E039 Hypothyroidism, unspecified: Secondary | ICD-10-CM | POA: Insufficient documentation

## 2022-06-29 DIAGNOSIS — I517 Cardiomegaly: Secondary | ICD-10-CM | POA: Diagnosis not present

## 2022-06-29 DIAGNOSIS — M19011 Primary osteoarthritis, right shoulder: Secondary | ICD-10-CM | POA: Diagnosis not present

## 2022-06-29 DIAGNOSIS — Z79899 Other long term (current) drug therapy: Secondary | ICD-10-CM | POA: Insufficient documentation

## 2022-06-29 DIAGNOSIS — M25511 Pain in right shoulder: Secondary | ICD-10-CM | POA: Diagnosis not present

## 2022-06-29 DIAGNOSIS — Z7901 Long term (current) use of anticoagulants: Secondary | ICD-10-CM | POA: Insufficient documentation

## 2022-06-29 LAB — COMPREHENSIVE METABOLIC PANEL
ALT: 20 U/L (ref 0–44)
AST: 29 U/L (ref 15–41)
Albumin: 3.6 g/dL (ref 3.5–5.0)
Alkaline Phosphatase: 62 U/L (ref 38–126)
Anion gap: 11 (ref 5–15)
BUN: 16 mg/dL (ref 8–23)
CO2: 25 mmol/L (ref 22–32)
Calcium: 9.1 mg/dL (ref 8.9–10.3)
Chloride: 100 mmol/L (ref 98–111)
Creatinine, Ser: 0.94 mg/dL (ref 0.44–1.00)
GFR, Estimated: 58 mL/min — ABNORMAL LOW (ref >60)
Glucose, Bld: 111 mg/dL — ABNORMAL HIGH (ref 70–99)
Potassium: 3.9 mmol/L (ref 3.5–5.1)
Sodium: 136 mmol/L (ref 135–145)
Total Bilirubin: 1 mg/dL (ref 0.3–1.2)
Total Protein: 7.7 g/dL (ref 6.5–8.1)

## 2022-06-29 LAB — URINALYSIS, MICROSCOPIC (REFLEX)

## 2022-06-29 LAB — I-STAT VENOUS BLOOD GAS, ED
Acid-Base Excess: 2 mmol/L (ref 0.0–2.0)
Bicarbonate: 28 mmol/L (ref 20.0–28.0)
Calcium, Ion: 1.21 mmol/L (ref 1.15–1.40)
HCT: 38 % (ref 36.0–46.0)
Hemoglobin: 12.9 g/dL (ref 12.0–15.0)
O2 Saturation: 36 %
Patient temperature: 98.8
Potassium: 4 mmol/L (ref 3.5–5.1)
Sodium: 139 mmol/L (ref 135–145)
TCO2: 29 mmol/L (ref 22–32)
pCO2, Ven: 46.3 mmHg (ref 44–60)
pH, Ven: 7.39 (ref 7.25–7.43)
pO2, Ven: 22 mmHg — CL (ref 32–45)

## 2022-06-29 LAB — CBC
HCT: 38.3 % (ref 36.0–46.0)
Hemoglobin: 12.1 g/dL (ref 12.0–15.0)
MCH: 30.7 pg (ref 26.0–34.0)
MCHC: 31.6 g/dL (ref 30.0–36.0)
MCV: 97.2 fL (ref 80.0–100.0)
Platelets: 242 10*3/uL (ref 150–400)
RBC: 3.94 MIL/uL (ref 3.87–5.11)
RDW: 15.3 % (ref 11.5–15.5)
WBC: 5.6 10*3/uL (ref 4.0–10.5)
nRBC: 0 % (ref 0.0–0.2)

## 2022-06-29 LAB — URINALYSIS, ROUTINE W REFLEX MICROSCOPIC
Bilirubin Urine: NEGATIVE
Glucose, UA: NEGATIVE mg/dL
Hgb urine dipstick: NEGATIVE
Ketones, ur: NEGATIVE mg/dL
Leukocytes,Ua: NEGATIVE
Nitrite: NEGATIVE
Protein, ur: 30 mg/dL — AB
Specific Gravity, Urine: 1.025 (ref 1.005–1.030)
pH: 5.5 (ref 5.0–8.0)

## 2022-06-29 LAB — RESP PANEL BY RT-PCR (FLU A&B, COVID) ARPGX2
Influenza A by PCR: NEGATIVE
Influenza B by PCR: NEGATIVE
SARS Coronavirus 2 by RT PCR: NEGATIVE

## 2022-06-29 LAB — CBG MONITORING, ED: Glucose-Capillary: 99 mg/dL (ref 70–99)

## 2022-06-29 LAB — MAGNESIUM: Magnesium: 1.8 mg/dL (ref 1.7–2.4)

## 2022-06-29 LAB — LACTIC ACID, PLASMA: Lactic Acid, Venous: 1.5 mmol/L (ref 0.5–1.9)

## 2022-06-29 MED ORDER — METOPROLOL TARTRATE 25 MG PO TABS: 25.0000 mg | ORAL_TABLET | Freq: Once | ORAL | Status: DC

## 2022-06-29 MED ORDER — CEFDINIR 300 MG PO CAPS: 300.0000 mg | ORAL_CAPSULE | Freq: Two times a day (BID) | ORAL | 0 refills | Status: AC

## 2022-06-29 MED ORDER — ONDANSETRON 4 MG PO TBDP: 4.0000 mg | ORAL_TABLET | Freq: Three times a day (TID) | ORAL | 0 refills | Status: DC | PRN

## 2022-06-29 MED ORDER — ACETAMINOPHEN 500 MG PO TABS
1000.0000 mg | ORAL_TABLET | Freq: Once | ORAL | Status: AC
  Administered 2022-06-29: 1000 mg via ORAL
  Filled 2022-06-29: qty 2

## 2022-06-29 MED ORDER — METOPROLOL TARTRATE 25 MG PO TABS
12.5000 mg | ORAL_TABLET | Freq: Once | ORAL | Status: AC
  Administered 2022-06-29: 12.5 mg via ORAL
  Filled 2022-06-29: qty 1

## 2022-06-29 MED ORDER — LACTATED RINGERS IV BOLUS
1000.0000 mL | Freq: Once | INTRAVENOUS | Status: AC
  Administered 2022-06-29: 1000 mL via INTRAVENOUS

## 2022-06-29 MED ORDER — SODIUM CHLORIDE 0.9 % IV SOLN
1.0000 g | Freq: Once | INTRAVENOUS | Status: AC
  Administered 2022-06-29: 1 g via INTRAVENOUS
  Filled 2022-06-29: qty 10

## 2022-06-29 NOTE — ED Provider Notes (Signed)
[facility] EMERGENCY DEPARTMENT Provider Note   CSN: [id number] Arrival date & time: 06/29/22  1239     History  Chief Complaint  Patient presents with   Weakness    Patricia [name] is a 88 y.o. female.  With PMH of CVA, MGUS, hypothyroidism, recent admission in August for PE on Eliquis and orthopedic injuries who presents with her daughter today for decreased appetite, generalized fatigue and weakness, intermittent confusion.  Her daughter is also here with her today to provide history.  Patient says she has had no appetite for the past week but denies any associated abdominal pain nausea or vomiting.  She has had decreased p.o. intake over the past week.  She has also had intermittent cough but this is chronic for her no sputum production, no chest pain, no shortness of breath.  She has been working with PT and has had no recent falls or head injuries.  She is compliant with her Eliquis and denies any melena or hematochezia.  Her daughter said she is intermittently confused for example today in the waiting room she cannot remember where they were but otherwise is currently at her neurologic baseline.  She has no focal weakness numbness or tingling.  Does complain of chronic right shoulder pain no recent injuries no numbness or tingling and has difficulty lifting past 90 degrees abduction.  They think this is typically how she presents when she has a UTI.  Weakness      Home Medications Prior to Admission medications   Medication Sig Start Date End Date Taking? Authorizing Provider  cefdinir (OMNICEF) 300 MG capsule Take 1 capsule (300 mg total) by mouth 2 (two) times daily for 7 days. 06/29/22 07/06/22 Yes Elgie Congo, MD  ondansetron (ZOFRAN-ODT) 4 MG disintegrating tablet Take 1 tablet (4 mg total) by mouth every 8 (eight) hours as needed for nausea or vomiting. 06/29/22  Yes Elgie Congo, MD  atorvastatin (LIPITOR) 40 MG tablet Take 40 mg by mouth daily. 09/26/20    [provider]  CALCIUM PO Take 1,000 mg by mouth 2 (two) times daily.     [provider]  cholecalciferol (VITAMIN D) 1000 units tablet Take 1,000 Units by mouth 2 (two) times daily.     [provider]  denosumab (PROLIA) 60 MG/ML SOSY injection Inject 60 mg into the skin every 6 (six) months.    [provider]  ELIQUIS 5 MG TABS tablet Take 5 mg by mouth 2 (two) times daily. 09/22/20   [provider]  levothyroxine (SYNTHROID, LEVOTHROID) 88 MCG tablet Take 88 mcg by mouth daily before breakfast.    [provider]  MELATONIN PO Take 10 mg by mouth at bedtime as needed.     [provider]  Multiple Vitamin (MULTIVITAMIN) tablet Take 1 tablet by mouth daily.    [provider]  pantoprazole (PROTONIX) 40 MG tablet Take 40 mg by mouth 2 (two) times daily. 12/06/20   [provider]  Probiotic Product (PROBIOTIC DAILY PO) Take 1 tablet by mouth daily.    [provider]  sertraline (ZOLOFT) 50 MG tablet Take 50 mg by mouth daily.     [provider]  tolterodine (DETROL LA) 4 MG 24 hr capsule Take 4 mg by mouth daily. 12/13/18   [provider]  vitamin B-12 (CYANOCOBALAMIN) 1000 MCG tablet Take 1,000 mcg by mouth daily.    [provider]  vitamin C (ASCORBIC ACID) 500 MG tablet  Take 500 mg by mouth daily.    [provider]      Allergies    Ciprofloxacin and Fosamax [alendronate sodium]    Review of Systems   Review of Systems  Neurological:  Positive for weakness.    Physical Exam Updated Vital Signs BP (!) 131/99   Pulse 100   Temp 98.8 F (37.1 C) (Oral)   Resp (!) 25   Ht '5\' 7"'$  (1.702 m)   Wt 70.3 kg   SpO2 (!) 89%   BMI 24.28 kg/m  Physical Exam Constitutional: Alert and oriented x 4. Well appearing and in no distress. Eyes: Conjunctivae are normal. ENT      Head: Normocephalic and atraumatic.      Nose: No congestion.      Mouth/Throat:  Mucous membranes are moist.      Neck: No stridor. No midline ttp. Cardiovascular: S1, S2,  irregular rhythm.Warm and well perfused. Respiratory: Normal respiratory effort. Breath sounds are normal.BS CTAB Gastrointestinal: Soft and nontender. There is no CVA tenderness. Musculoskeletal: Mild right shoulder tenderness with passive abduction past 90 degrees.  No bony abnormality, neurovascularly intact.  No edema or ttp b/l LE Neurologic: Normal speech and language. CN 2-12 grossly intact. Equal strength b/l UE and LE. Sensation grossly intact.No gross focal neurologic deficits are appreciated. Skin: Skin is warm, dry and intact. No rash noted. Psychiatric: Mood and affect are normal. Speech and behavior are normal.  ED Results / Procedures / Treatments   Labs (all labs ordered are listed, but only abnormal results are displayed) Labs Reviewed  URINALYSIS, ROUTINE W REFLEX MICROSCOPIC - Abnormal; Notable for the following components:      Result Value   APPearance HAZY (*)    Protein, ur 30 (*)    All other components within normal limits  COMPREHENSIVE METABOLIC PANEL - Abnormal; Notable for the following components:   Glucose, Bld 111 (*)    GFR, Estimated 58 (*)    All other components within normal limits  URINALYSIS, MICROSCOPIC (REFLEX) - Abnormal; Notable for the following components:   Bacteria, UA MANY (*)    All other components within normal limits  I-STAT VENOUS BLOOD GAS, ED - Abnormal; Notable for the following components:   pO2, Ven 22 (*)    All other components within normal limits  RESP PANEL BY RT-PCR (FLU A&B, COVID) ARPGX2  CBC  LACTIC ACID, PLASMA  MAGNESIUM  CBG MONITORING, ED    EKG None  Radiology DG Shoulder Right  Result Date: 06/29/2022 CLINICAL DATA:  Right shoulder pain for 3 weeks, no reported injury EXAM: RIGHT SHOULDER - 2+ VIEW COMPARISON:  None Available. FINDINGS: No fracture. No glenohumeral dislocation. No evidence of acromioclavicular  separation. Mild right glenohumeral joint osteoarthritis. Narrowing of the subacromial space with degenerative changes at the undersurface of the acromion and superior surface of the right greater humeral tuberosity. No suspicious focal osseous lesions. No radiopaque foreign bodies. IMPRESSION: No acute osseous abnormality. Mild right glenohumeral joint osteoarthritis. Narrowing of the subacromial space with degenerative changes at the undersurface of the acromion and superior surface of the right greater humeral tuberosity, suggestive of chronic rotator cuff tear. Electronically Signed   By: Ilona Sorrel M.D.   On: 06/29/2022 14:07   CT Head Wo Contrast  Result Date: 06/29/2022 CLINICAL DATA:  Weakness, loss of appetite and confusion. EXAM: CT HEAD WITHOUT CONTRAST TECHNIQUE: Contiguous axial images were obtained from the base of the skull through the vertex without intravenous contrast.  RADIATION DOSE REDUCTION: This exam was performed according to the departmental dose-optimization program which includes automated exposure control, adjustment of the mA and/or kV according to patient size and/or use of iterative reconstruction technique. COMPARISON:  March 13, 2021 FINDINGS: Brain: No evidence of acute infarction, hemorrhage, hydrocephalus, extra-axial collection or mass lesion/mass effect. Chronic findings of advanced deep white matter microangiopathy. More focal area of hypoattenuation in the left occipital lobe is stable and may represent a prior ischemic infarction versus prominent microangiopathy. Probable prior ischemic infarction in the left cerebellum. Vascular: No hyperdense vessel or unexpected calcification. Skull: Normal. Negative for fracture or focal lesion. Sinuses/Orbits: No acute finding. Other: None. IMPRESSION: 1. No acute intracranial abnormality. 2. Chronic findings of advanced deep white matter microangiopathy. 3. More focal area of hypoattenuation in the left occipital lobe is stable and  may represent a prior ischemic infarction versus prominent microangiopathy. 4. Probable prior ischemic infarction in the left cerebellum. Electronically Signed   By: Fidela Salisbury M.D.   On: 06/29/2022 14:00   DG Chest Portable 1 View  Result Date: 06/29/2022 CLINICAL DATA:  Acute mental status change EXAM: PORTABLE CHEST 1 VIEW COMPARISON:  December 18, 2020 FINDINGS: Stable cardiomegaly. The hila, mediastinum, lungs, and pleura are unremarkable. No acute abnormalities. IMPRESSION: No active disease. Electronically Signed   By: Dorise Bullion III M.D.   On: 06/29/2022 13:40    Procedures Procedures    Medications Ordered in ED Medications  cefTRIAXone (ROCEPHIN) 1 g in sodium chloride 0.9 % 100 mL IVPB (0 g Intravenous Stopped 06/29/22 1508)  acetaminophen (TYLENOL) tablet 1,000 mg (1,000 mg Oral Given 06/29/22 1430)  lactated ringers bolus 1,000 mL (0 mLs Intravenous Stopped 06/29/22 1748)  metoprolol tartrate (LOPRESSOR) tablet 12.5 mg (12.5 mg Oral Given 06/29/22 1616)    ED Course/ Medical Decision Making/ A&P                           Medical Decision Making Karyme Mcconathy is a 88 y.o. female.  With PMH of CVA, MGUS, hypothyroidism, recent admission in August for PE on Eliquis and orthopedic injuries who presents with her daughter today for decreased appetite, generalized fatigue and weakness, intermittent confusion.   Patient presents of generalized fatigue and weakness rather than a focal weakness or deficit.  Her exam was reassuring and showed no focal deficits, I am not concerned for CVA.  Due to her intermittent confusion a CT head was obtained with no evidence of ICH or acute intracranial abnormality but evidence of old stroke.  I have no concern for ACS or cardiac abnormality with no chest pain shortness of breath or other cardiopulmonary symptoms.  A chest x-ray was obtained which showed no evidence of pneumonia, no pleural effusion.  COVID and flu were negative.  Her UA was hazy  with many bacteria 11-20 WBCs and due to her symptoms she was treated with IV Rocephin and IV fluids in the ED.  She has no AKI creatinine 0.94.  Normal white blood cell count 5.6, I am not concerned for sepsis. Normal Hgb no concern for GIB with no complaints of bleeding and normal Hgb.  Her right shoulder x-ray showed no acute fracture or dislocation but evidence of arthritis and chronic rotator cuff.  Since patient is here with her daughter whom she lives with and she is at neurologic baseline she made the decision with shared decision making to go home at this time and trial oral antibiotics.  We have discussed strict return precautions and follow-up with PCP.  Both patient and daughter in agreement with this plan.  I have also advised that she follow-up with her cardiologist for discussion of rate controlling medications as she had episode of intermittent A-fib RVR which we gave p.o. metoprolol and fluids with improvement.  Amount and/or Complexity of Data Reviewed Labs: ordered. Radiology: ordered.  Risk OTC drugs. Prescription drug management.    Final Clinical Impression(s) / ED Diagnoses Final diagnoses:  Weakness  Urinary tract infection without hematuria, site unspecified  Osteoarthritis of right glenohumeral joint  Atrial fibrillation, unspecified type ([place])    Rx / DC Orders ED Discharge Orders          Ordered    cefdinir (OMNICEF) 300 MG capsule  2 times daily        06/29/22 1600    ondansetron (ZOFRAN-ODT) 4 MG disintegrating tablet  Every 8 hours PRN        06/29/22 1600              Elgie Congo, MD 06/29/22 1916

## 2022-06-29 NOTE — ED Triage Notes (Signed)
Patient brought in by family for weakness, loss of appetite, confused x 1 week.

## 2022-06-29 NOTE — Discharge Instructions (Addendum)
You were seen in the emergency department today feeling generalized weakness and unwell.  Your urine does suggest a urinary tract infection.  Take the antibiotics as prescribed you can take the Zofran as needed for nausea or vomiting.  Please make sure you are still drinking plenty of fluids.  You were given a medicine today to help your heart rate as it was slightly fast with your A-fib.  You will need to follow-up with your cardiologist to determine if you need to start rate controlling medicines.  Your CT scan and labs and other imaging were overall reassuring.  You do not require hospitalization at this time.  The x-ray did show evidence of arthritis and probably an old rotator cuff injury and your right shoulder.  Take Tylenol and ibuprofen as needed for pain.  Follow-up with your primary care physician regarding your visit to the ER today.  Come back with any severe worsening weakness, inability to keep down any fluids or food, worsening confusion, or any other symptoms concerning to you.

## 2022-07-02 NOTE — Progress Notes (Unsigned)
Office Visit    Patient Name: Patricia [name] Date of Encounter: 07/03/2022  Primary Care Provider:  Deland Pretty, MD Primary Cardiologist:  None Primary Electrophysiologist: None  Chief Complaint    Patricia [name] is a 88 y.o. female with PMH of syncope, PAF (on Eliquis), HLD CVA, GERD, depression who presents today for post ED follow-up of intermittent AF with RVR.  Past Medical History    Past Medical History:  Diagnosis Date   Bladder incontinence    Depression    GERD (gastroesophageal reflux disease)    High cholesterol    Hypothyroid    Recurrent falls    Stroke [facility])    Past Surgical History:  Procedure Laterality Date   CARPAL TUNNEL RELEASE     CATARACT EXTRACTION Bilateral    EYE SURGERY Left 02/15/15    Laser surgery     Allergies  Allergies  Allergen Reactions   Ciprofloxacin    Fosamax [Alendronate Sodium] Other (See Comments)    Upset stomach    History of Present Illness    Patricia [name]  is a 88 year old female with the above mention past medical history who presents today for intermittent AF with RVR.  She was initially seen by Dr. Marlou Porch in 12/2020 for complaint of near syncope.  This event occurred after eating dinner and possibility of vasovagal etiology was considered.  Patient was given a 14-day ZIO monitor that revealed sinus rhythm with no atrial fibrillation or pauses.  Patient has atrial fibrillation and is taking Eliquis for this.  She was seen in follow-up 3 months later and had no reports of syncope at that time.  2D echo was completed with EF of 65-70%, no RWMA, trivial MV regurgitation.  She was seen in the ED on 06/29/2022 with complaint of weakness, loss of appetite and intermittent confusion.  Patient denies any chest pain and CT of the head was obtained with no evidence of acute intracranial abnormalities or new strokes.  Patient did have episode of intermittent A-fib with RVR which was treated with metoprolol and fluids with  improvement.   Patricia [name] presents today for post ED follow-up with her daughter.  Since last being seen in the office patient reports she is not feeling well and has complaints of shortness of breath, fatigue, lower extremity swelling since ED visit on Saturday.  She is volume up 9 pounds on today's visit.  Her daughter states that she has very poor quality of life since going into atrial fibrillation.  Her blood pressure today is 130/74 and EKG shows AF with RVR and rate of 123 bpm.  Patient denies chest pain, PND, orthopnea, nausea, vomiting, dizziness, syncope,weight gain, or early satiety.   Current Outpatient Medications  Medication Sig Dispense Refill   atorvastatin (LIPITOR) 40 MG tablet Take 40 mg by mouth daily.     CALCIUM PO Take 1,000 mg by mouth 2 (two) times daily.      cefdinir (OMNICEF) 300 MG capsule Take 1 capsule (300 mg total) by mouth 2 (two) times daily for 7 days. 14 capsule 0   cholecalciferol (VITAMIN D) 1000 units tablet Take 1,000 Units by mouth 2 (two) times daily.      denosumab (PROLIA) 60 MG/ML SOSY injection Inject 60 mg into the skin every 6 (six) months.     ELIQUIS 5 MG TABS tablet Take 5 mg by mouth 2 (two) times daily.     furosemide (LASIX) 20 MG tablet Take 1  tablet (20 mg total) by mouth 2 (two) times daily. FOR FOUR DAYS ONLY 10 tablet 3   levothyroxine (SYNTHROID, LEVOTHROID) 88 MCG tablet Take 88 mcg by mouth daily before breakfast.     MELATONIN PO Take 10 mg by mouth at bedtime as needed.      Multiple Vitamin (MULTIVITAMIN) tablet Take 1 tablet by mouth daily.     ondansetron (ZOFRAN-ODT) 4 MG disintegrating tablet Take 1 tablet (4 mg total) by mouth every 8 (eight) hours as needed for nausea or vomiting. 20 tablet 0   pantoprazole (PROTONIX) 40 MG tablet Take 40 mg by mouth 2 (two) times daily.     sertraline (ZOLOFT) 50 MG tablet Take 50 mg by mouth daily.      vitamin B-12 (CYANOCOBALAMIN) 1000 MCG tablet Take 1,000 mcg by mouth daily.     vitamin  C (ASCORBIC ACID) 500 MG tablet Take 500 mg by mouth daily.     Probiotic Product (PROBIOTIC DAILY PO) Take 1 tablet by mouth daily.     tolterodine (DETROL LA) 4 MG 24 hr capsule Take 4 mg by mouth daily.     No current facility-administered medications for this visit.     Review of Systems  Please see the history of present illness.    (+) Shortness of breath (+) Lower extremity swelling  All other systems reviewed and are otherwise negative except as noted above.  Physical Exam    Wt Readings from Last 3 Encounters:  07/03/22 164 lb 6.4 oz (74.6 kg)  06/29/22 155 lb (70.3 kg)  03/13/21 158 lb (71.7 kg)   VS: Vitals:   07/03/22 1019  BP: 130/74  Pulse: (!) 123  SpO2: 97%  ,Body mass index is 25.75 kg/m.  Constitutional:      Appearance: Healthy appearance. Not in distress.  Neck:     Vascular: JVD normal.  Pulmonary:     Effort: Pulmonary effort is normal.     Breath sounds: No wheezing. No rales. Diminished in the bases Cardiovascular:     Irregularly irregular normal S1. Normal S2.      Murmurs: There is no murmur.  Edema:    Peripheral edema absent.  Abdominal:     Palpations: Abdomen is soft non tender. There is no hepatomegaly.  Skin:    General: Skin is warm and dry.  Neurological:     General: No focal deficit present.     Mental Status: Alert and oriented to person, place and time.     Cranial Nerves: Cranial nerves are intact.  EKG/LABS/Other Studies Reviewed    ECG personally reviewed by me today -atrial fibrillation with RVR and right axis deviation with rate of 123 bpm and no acute changes noted.  Risk Assessment/Calculations:    CHA2DS2-VASc Score = 6   This indicates a 9.7% annual risk of stroke. The patient's score is based upon: CHF History: 0 HTN History: 1 Diabetes History: 0 Stroke History: 2 Vascular Disease History: 0 Age Score: 2 Gender Score: 1           Lab Results  Component Value Date   WBC 5.6 06/29/2022   HGB  12.9 06/29/2022   HCT 38.0 06/29/2022   MCV 97.2 06/29/2022   PLT 242 06/29/2022   Lab Results  Component Value Date   CREATININE 0.94 06/29/2022   BUN 16 06/29/2022   NA 139 06/29/2022   K 4.0 06/29/2022   CL 100 06/29/2022   CO2 25 06/29/2022   Lab  Results  Component Value Date   ALT 20 06/29/2022   AST 29 06/29/2022   ALKPHOS 62 06/29/2022   BILITOT 1.0 06/29/2022   No results found for: "CHOL", "HDL", "LDLCALC", "LDLDIRECT", "TRIG", "CHOLHDL"  Lab Results  Component Value Date   HGBA1C 5.9 (H) 01/10/2016    Assessment & Plan    1.  Paroxysmal atrial fibrillation: -Today patient is in atrial fibrillation with RVR and rate of 123 bpm -Case discussed with DOD Dr. Chauncey Cruel who agrees with starting patient on Lasix 20 mg twice daily for 4 days and is reporting back weights and status on Monday of next week. -If patient is still experiencing RVR we will plan to move ahead with DCCV -Patient reports no bleeding with NOAC -Dose appropriate based on patient's weight and most recent creatinine of 0.94 -Continue Eliquis 5 mg twice daily  2.  History of CVA: -History of occipital stroke 2016 -Patient currently on Eliquis and atorvastatin 40 mg  3.  Hyperlipidemia: -Patient currently on atorvastatin 40 mg as noted above -Last LDL cholesterol was   Disposition: Follow-up in 1 week with Dr. Marlou Porch   Shared Decision Making/Informed Consent The risks (stroke, cardiac arrhythmias rarely resulting in the need for a temporary or permanent pacemaker, skin irritation or burns and complications associated with conscious sedation including aspiration, arrhythmia, respiratory failure and death), benefits (restoration of normal sinus rhythm) and alternatives of a direct current cardioversion were explained in detail to Patricia [name] and she agrees to proceed.     Medication Adjustments/Labs and Tests Ordered: Current medicines are reviewed at length with the patient today.  Concerns  regarding medicines are outlined above.   Signed, Mable Fill, Marissa Nestle, NP 07/03/2022, 12:24 PM [facility]  Note:  This document was prepared using Dragon voice recognition software and may include unintentional dictation errors.

## 2022-07-03 ENCOUNTER — Encounter: Payer: Self-pay | Admitting: Nurse Practitioner

## 2022-07-03 ENCOUNTER — Ambulatory Visit: Payer: Medicare PPO | Attending: Nurse Practitioner | Admitting: Nurse Practitioner

## 2022-07-03 VITALS — BP 130/74 | HR 123 | Ht 67.0 in | Wt 164.4 lb

## 2022-07-03 DIAGNOSIS — I480 Paroxysmal atrial fibrillation: Secondary | ICD-10-CM | POA: Diagnosis not present

## 2022-07-03 DIAGNOSIS — R739 Hyperglycemia, unspecified: Secondary | ICD-10-CM | POA: Diagnosis not present

## 2022-07-03 DIAGNOSIS — I639 Cerebral infarction, unspecified: Secondary | ICD-10-CM

## 2022-07-03 MED ORDER — FUROSEMIDE 20 MG PO TABS: 20.0000 mg | ORAL_TABLET | Freq: Two times a day (BID) | ORAL | 3 refills | Status: DC

## 2022-07-03 NOTE — Patient Instructions (Signed)
Medication Instructions: Lasix 20 mg twice a day for 4 days  *If you need a refill on your cardiac medications before your next appointment, please call your pharmacy*   Lab Work:  If you have labs (blood work) drawn today and your tests are completely normal, you will receive your results only by: [place] (if you have MyChart) OR A paper copy in the mail If you have any lab test that is abnormal or we need to change your treatment, we will call you to review the results.   Testing/Procedures:  Your physician has recommended that you have a Cardioversion (DCCV). Electrical Cardioversion uses a jolt of electricity to your heart either through paddles or wired patches attached to your chest. This is a controlled, usually prescheduled, procedure. Defibrillation is done under light anesthesia in the hospital, and you usually go home the day of the procedure. This is done to get your heart back into a normal rhythm. You are not awake for the procedure. Please see the instruction sheet given to you today.   WEIGH YOURSELF DAILY     Follow-Up: At [facility], you and your health needs are our priority.  As part of our continuing mission to provide you with exceptional heart care, we have created designated Provider Care Teams.  These Care Teams include your primary Cardiologist (physician) and Advanced Practice Providers (APPs -  Physician Assistants and Nurse Practitioners) who all work together to provide you with the care you need, when you need it.  We recommend signing up for the patient portal called "MyChart".  Sign up information is provided on this After Visit Summary.  MyChart is used to connect with patients for Virtual Visits (Telemedicine).  Patients are able to view lab/test results, encounter notes, upcoming appointments, etc.  Non-urgent messages can be sent to your provider as well.   To learn more about what you can do with MyChart, go to  [email or web address].    Your next appointment:  Keep 07/12/22 appt with D Marlou Porch     Important Information About Sugar

## 2022-07-08 ENCOUNTER — Telehealth: Payer: Self-pay

## 2022-07-08 NOTE — Telephone Encounter (Signed)
-----   Message from Marylu Lund., NP sent at 07/08/2022  9:27 AM EDT ----- Regarding: RE: Pt to call on Monday Good morning,  I am not in the office today and wanted to see if you could give Ms.Suggs a call to see how she is doing today with her atrial fibrillation.  She was in RVR during appointment with plans to schedule for possible DCCV if still having symptoms.  Please let me know if you have any questions.  Ambrose Pancoast, NP   ----- Message ----- From: Stephani Police, RN Sent: 07/03/2022  11:45 AM EDT To: Marylu Lund., NP Subject: Pt to call on Monday                           Hey, this is the pt to call on Monday about possible Cardioversion.   Lelon Frohlich

## 2022-07-08 NOTE — Telephone Encounter (Signed)
I spoke with the pts daughter and she plans to call me back today after she gets home where she has the pts VS recorded.... she does say that the pt's edema has improved quite a bit and her breathing is not as hard when she exerts herself but she is very tired and does not show much interest getting out of bed... she believes her rate is still up and will call me back shortly with her readings.

## 2022-07-08 NOTE — Telephone Encounter (Signed)
Error

## 2022-07-08 NOTE — Telephone Encounter (Signed)
I spoke with the pts daughter and she plans to call me back today after she gets home where she has the pts VS recorded.... she does say that the pt's edema has improved quite a bit and her breathing is not as hard when she exerts herself but she is very tired and does not show much interest getting out of bed... she believes her rate is still up and will call me back shortly with her readings.     Addendum:   I spoke with the pts daughter and she gave me her VS list:   Wed HR 85 Friday 95/66 HR 104 after fluids 108/80 HR 116 Saturday 119/87 HR 91 weight back to "normal" 155 lbs Today 111/79 HR 99 weight 154 lbs but has not been eating  Last Lasix was Saturday evening.  Due to VS being not completely abnormal... I will forward to Ambrose Pancoast PA to review and I will follow up with him first this 07/09/22 here in the office.   Pt has appt with Dr Marlou Porch 07/12/22.

## 2022-07-09 NOTE — Telephone Encounter (Signed)
I called to check on the pt and the daughter noted that she had not checked today since her grandson is in the hops with pain of unknown origin... she says the pt is doing well/ she will check her VS this afternoon and will My Chart to [place] her readings.

## 2022-07-12 ENCOUNTER — Ambulatory Visit: Payer: Medicare PPO | Attending: Cardiology | Admitting: Cardiology

## 2022-07-12 ENCOUNTER — Other Ambulatory Visit: Payer: Self-pay | Admitting: Cardiology

## 2022-07-12 ENCOUNTER — Encounter: Payer: Self-pay | Admitting: Cardiology

## 2022-07-12 VITALS — BP 100/60 | HR 72 | Ht 67.0 in | Wt 157.0 lb

## 2022-07-12 DIAGNOSIS — R55 Syncope and collapse: Secondary | ICD-10-CM

## 2022-07-12 DIAGNOSIS — I480 Paroxysmal atrial fibrillation: Secondary | ICD-10-CM | POA: Diagnosis not present

## 2022-07-12 MED ORDER — FUROSEMIDE 20 MG PO TABS: 20.0000 mg | ORAL_TABLET | Freq: Every day | ORAL | 3 refills | Status: AC

## 2022-07-12 NOTE — Progress Notes (Signed)
Cardiology Office Note:    Date:  07/12/2022   ID:  Patricia [name], DOB 01-12-1930, MRN [id number]  PCP:  Deland Pretty, MD   [facility] Providers Cardiologist:  None     Referring MD: Deland Pretty, MD     History of Present Illness:    Patricia [name] is a 88 y.o. female here for the follow-up of A fib, palpitations, and near syncope.   She met with Tempie Donning NP, on 07/03/2022 for a post ED follow up. She reported not feeling well, and complained of SOB, fatigue, and LE edema since the ED visit. Her blood pressure at the visit was 130/74 and EKG showed AF with RVR and rate of 123 bpm. She was started on Lasix 20 mg twice daily for 4 days   She was in the emergency department on 12/18/2020. After she had  finished eating dinner at a restaurant she felt lightheaded dizzy diaphoretic.  She had just finished eating [place] food with extremely sweet sweet tea. She did not actually lose consciousness. Troponin values were 21 and 29.  CT of the head was unremarkable. Cardiac monitoring was unremarkable during the ER visit.   Had TIA a few years ago. Confused tried to turn TV off with cell.  MRI brain showed small old left occipital cerebellar strokes.   She had paroxysmal atrial fibrillation. She had been taking Eliquis   No further issues with near syncope.  Echocardiogram and ZIO monitor were reassuring.  Today: She is accompanied by Patricia [name], who is also a patient of mine. They both have appointments scheduled today, and wish to be seen together.  She states that she has not been feeling well, which she attributes to her age.   Her daughter also reports that she has been feeling tired, winded, has no energy and interest in anything. Overall, she is exhausted. It took her a while to get up to come to the visit today.   Lasix worked well and helped in lowering her weight. She had complete resolution of her edema.  She has been compliant with the Eliquis without any missed  doses.   She denies any palpitations, chest pain, shortness of breath, or peripheral edema. No lightheadedness, headaches, syncope, orthopnea, or PND.   Past Medical History:  Diagnosis Date   Bladder incontinence    Depression    GERD (gastroesophageal reflux disease)    High cholesterol    Hypothyroid    Recurrent falls    Stroke [facility])     Past Surgical History:  Procedure Laterality Date   CARPAL TUNNEL RELEASE     CATARACT EXTRACTION Bilateral    EYE SURGERY Left 02/15/15    Laser surgery     Current Medications: Current Meds  Medication Sig   atorvastatin (LIPITOR) 40 MG tablet Take 40 mg by mouth daily.   CALCIUM PO Take 1,000 mg by mouth 2 (two) times daily.    cholecalciferol (VITAMIN D) 1000 units tablet Take 1,000 Units by mouth 2 (two) times daily.    denosumab (PROLIA) 60 MG/ML SOSY injection Inject 60 mg into the skin every 6 (six) months.   ELIQUIS 5 MG TABS tablet Take 5 mg by mouth 2 (two) times daily.   furosemide (LASIX) 20 MG tablet Take 1 tablet (20 mg total) by mouth daily.   levothyroxine (SYNTHROID, LEVOTHROID) 88 MCG tablet Take 88 mcg by mouth daily before breakfast.   MELATONIN PO Take 10 mg by mouth at bedtime as  needed.    pantoprazole (PROTONIX) 40 MG tablet Take 40 mg by mouth 2 (two) times daily.   sertraline (ZOLOFT) 50 MG tablet Take 50 mg by mouth daily.    vitamin B-12 (CYANOCOBALAMIN) 1000 MCG tablet Take 1,000 mcg by mouth daily.   vitamin C (ASCORBIC ACID) 500 MG tablet Take 500 mg by mouth daily.   [DISCONTINUED] furosemide (LASIX) 20 MG tablet Take 1 tablet (20 mg total) by mouth 2 (two) times daily. FOR FOUR DAYS ONLY     Allergies:   Ciprofloxacin and Fosamax [alendronate sodium]   Social History   Socioeconomic History   Marital status: Widowed    Spouse name: Joe    Number of children: 4   Years of education: 16+   Highest education level: Not on file  Occupational History   Not on file  Tobacco Use   Smoking status:  Never    Passive exposure: Never   Smokeless tobacco: Never  Vaping Use   Vaping Use: Never used  Substance and Sexual Activity   Alcohol use: Yes    Alcohol/week: 0.0 standard drinks of alcohol    Comment: Very rarley    Drug use: No   Sexual activity: Not on file    Comment: widowed, 2 daughters and 1 son. Retired Education officer, museum  Other Topics Concern   Not on file  Social History Narrative   Lives at home with husband   Caffeine use:  2 cups coffee every morning   Occass p[employer] and tea   Social Determinants of Health   Financial Resource Strain: Not on file  Food Insecurity: Not on file  Transportation Needs: Not on file  Physical Activity: Not on file  Stress: Not on file  Social Connections: Not on file     Family History: The patient's family history includes Anuerysm in her daughter; Cancer in her maternal aunt; Heart disease in her mother; Lupus in her daughter; Throat cancer in her maternal aunt.  ROS:   Please see the history of present illness.    (+) Fatigue All other systems reviewed and are negative.  EKGs/Labs/Other Studies Reviewed:    The following studies were reviewed today:  ECHO 2022:   1. Left ventricular ejection fraction, by estimation, is 65 to 70%. The  left ventricle has normal function. The left ventricle has no regional  wall motion abnormalities. Left ventricular diastolic parameters are  consistent with Grade I diastolic  dysfunction (impaired relaxation).   2. Right ventricular systolic function is normal. The right ventricular  size is normal.   3. The mitral valve is abnormal. Trivial mitral valve regurgitation.   4. The aortic valve is tricuspid. Aortic valve regurgitation is not  visualized. Mild aortic valve sclerosis is present, with no evidence of  aortic valve stenosis.   5. The inferior vena cava IVC appears spontaneously collapsed, suggestive  of volume depletion and a low RA pressure <3 mmHg.    Conclusion(s)/Recommendation(s): Consider volume depletion as possible  etiology of near-syncope.   Monitor 01/2021:  Sinus rhythm with average of 73 bpm SVT fastest for 6 beats at 222 bpm, longest 24 seconds at 107 bpm Occasional PVC's and rare PAC No atrial fibrillation, no pauses  No electrocardiographic reasons for syncope   Patch Wear Time:  12 days and 14 hours (2022-03-19T17:46:54-0400 to 2022-04-01T08:11:57-0400)   Patient had a min HR of 55 bpm, max HR of 222 bpm, and avg HR of 73 bpm. Predominant underlying rhythm was Sinus Rhythm. [place]  Supraventricular Tachycardia runs occurred, the run with the fastest interval lasting 6 beats with a max rate of 222 bpm, the  longest lasting 23.6 secs with an avg rate of 107 bpm. Isolated SVEs were rare (<1.0%), SVE Couplets were rare (<1.0%), and SVE Triplets were rare (<1.0%). Isolated VEs were occasional (2.3%, 30057), VE Couplets were rare (<1.0%, 272), and no VE Triplets  were present. Ventricular Bigeminy and Trigeminy were present. Some VE(s) may be SVE(s) conducted with possible aberrancy.  EKG: EKG is personally reviewed. 07/12/2022: EKG was not ordered. 07/03/2022 Ambrose Pancoast, NP): Atrial fibrillation with RVR and right axis deviation with rate of 123 bpm and no acute changes noted.   Recent Labs: 06/29/2022: ALT 20; BUN 16; Creatinine, Ser 0.94; Hemoglobin 12.9; Magnesium 1.8; Platelets 242; Potassium 4.0; Sodium 139   Recent Lipid Panel No results found for: "CHOL", "TRIG", "HDL", "CHOLHDL", "VLDL", "LDLCALC", "LDLDIRECT"   Risk Assessment/Calculations:      Physical Exam:    VS:  BP 100/60 (BP Location: Left Arm, Patient Position: Sitting, Cuff Size: Normal)   Pulse 72   Ht '5\' 7"'  (1.702 m)   Wt 157 lb (71.2 kg)   BMI 24.59 kg/m     Wt Readings from Last 3 Encounters:  07/12/22 157 lb (71.2 kg)  07/03/22 164 lb 6.4 oz (74.6 kg)  06/29/22 155 lb (70.3 kg)     GEN: Thin in no acute distress,  in wheelchair HEENT:  Normal NECK: No JVD; No carotid bruits LYMPHATICS: No lymphadenopathy CARDIAC: RRR, no murmurs, rubs, gallops RESPIRATORY:  Clear to auscultation without rales, wheezing or rhonchi  ABDOMEN: Soft, non-tender, non-distended MUSCULOSKELETAL:  No edema; No deformity  SKIN: Warm and dry NEUROLOGIC:  Alert and oriented x 3 PSYCHIATRIC:  Normal affect   ASSESSMENT:    No diagnosis found. PLAN:    In order of problems listed above:  Near syncope Echocardiogram reassuring.  Monitor reassuring.  No evidence of adverse arrhythmias.  Likely result of dehydration.  Continue to hydrate well.  No further issues.  Palpitations Sinus rhythm with average of 73 bpm  SVT fastest for 6 beats at 222 bpm, longest 24 seconds at 107 bpm  Occasional PVC's and rare PAC  No atrial fibrillation, no pauses     No electrocardiographic reasons for syncope  Paroxysmal atrial fibrillation - Personally reviewed and interpreted EKG from 09/20/2020 which clearly showed atrial fibrillation with heart rate of 118 bpm at Dr. Lambert Mody office.  Continue with anticoagulation.  Chronic anticoagulation - Continue with Eliquis.  Watch for any signs of bleeding.  Medication monitoring.  Lab monitoring.  Hemoglobin 13.0 creatinine 0.9 from outside labs  Follow Up: 1 month  Medication Adjustments/Labs and Tests Ordered: Current medicines are reviewed at length with the patient today.  Concerns regarding medicines are outlined above.   No orders of the defined types were placed in this encounter.  Meds ordered this encounter  Medications   furosemide (LASIX) 20 MG tablet    Sig: Take 1 tablet (20 mg total) by mouth daily.    Dispense:  90 tablet    Refill:  3   Patient Instructions  Medication Instructions:  Please take Furosemide (Lasix) 20 mg once a day. Continue all other medications as listed.  *If you need a refill on your cardiac medications before your next appointment, please call your  pharmacy*  Testing/Procedures:  You are scheduled for a Cardioversion  on October 17 with Dr. Gasper Sells.  Please arrive at the [facility] (  Main Entrance A) at [facility]: [address] [place], [place] [address] at 11 am. (1 hour prior to procedure unless lab work is needed; if lab work is needed arrive 1.5 hours ahead)  DIET: Nothing to eat or drink after midnight except a sip of water with medications (see medication instructions below)  FYI: For your safety, and to allow [place] to monitor your vital signs accurately during the surgery/procedure we request that   if you have artificial nails, gel coating, SNS etc. Please have those removed prior to your surgery/procedure. Not having the nail coverings /polish removed may result in cancellation or delay of your surgery/procedure.   Medication Instructions: Hold Furosemide the morning of your procedure.  Continue your anticoagulant:  Do not miss any doses between now and the time of the procedure.  Labs: none needed  You must have a responsible person to drive you home and stay in the waiting area during your procedure. Failure to do so could result in cancellation.  Bring your insurance cards.  *Special Note: Every effort is made to have your procedure done on time. Occasionally there are emergencies that occur at the hospital that may cause delays. Please be patient if a delay does occur.    Follow-Up: At [facility], you and your health needs are our priority.  As part of our continuing mission to provide you with exceptional heart care, we have created designated Provider Care Teams.  These Care Teams include your primary Cardiologist (physician) and Advanced Practice Providers (APPs -  Physician Assistants and Nurse Practitioners) who all work together to provide you with the care you need, when you need it.  We recommend signing up for the patient portal called "MyChart".  Sign up information is provided on  this After Visit Summary.  MyChart is used to connect with patients for Virtual Visits (Telemedicine).  Patients are able to view lab/test results, encounter notes, upcoming appointments, etc.  Non-urgent messages can be sent to your provider as well.   To learn more about what you can do with MyChart, go to [email or web address].    Your next appointment:   1 month(s)  The format for your next appointment:   In Person  Provider:   Dr Candee Furbish      Important Information About Sugar         Waynetta Pean  07/12/2022 4:59 PM    [place]

## 2022-07-12 NOTE — Patient Instructions (Signed)
Medication Instructions:  Please take Furosemide (Lasix) 20 mg once a day. Continue all other medications as listed.  *If you need a refill on your cardiac medications before your next appointment, please call your pharmacy*  Testing/Procedures:  You are scheduled for a Cardioversion  on October 17 with Dr. Gasper Sells.  Please arrive at the [facility] (Main Entrance A) at [facility]: [address] [place], [place] [address] at 11 am. (1 hour prior to procedure unless lab work is needed; if lab work is needed arrive 1.5 hours ahead)  DIET: Nothing to eat or drink after midnight except a sip of water with medications (see medication instructions below)  FYI: For your safety, and to allow [place] to monitor your vital signs accurately during the surgery/procedure we request that   if you have artificial nails, gel coating, SNS etc. Please have those removed prior to your surgery/procedure. Not having the nail coverings /polish removed may result in cancellation or delay of your surgery/procedure.   Medication Instructions: Hold Furosemide the morning of your procedure.  Continue your anticoagulant:  Do not miss any doses between now and the time of the procedure.  Labs: none needed  You must have a responsible person to drive you home and stay in the waiting area during your procedure. Failure to do so could result in cancellation.  Bring your insurance cards.  *Special Note: Every effort is made to have your procedure done on time. Occasionally there are emergencies that occur at the hospital that may cause delays. Please be patient if a delay does occur.    Follow-Up: At [facility], you and your health needs are our priority.  As part of our continuing mission to provide you with exceptional heart care, we have created designated Provider Care Teams.  These Care Teams include your primary Cardiologist (physician) and Advanced Practice Providers (APPs -  Physician  Assistants and Nurse Practitioners) who all work together to provide you with the care you need, when you need it.  We recommend signing up for the patient portal called "MyChart".  Sign up information is provided on this After Visit Summary.  MyChart is used to connect with patients for Virtual Visits (Telemedicine).  Patients are able to view lab/test results, encounter notes, upcoming appointments, etc.  Non-urgent messages can be sent to your provider as well.   To learn more about what you can do with MyChart, go to [email or web address].    Your next appointment:   1 month(s)  The format for your next appointment:   In Person  Provider:   Dr Candee Furbish      Important Information About Sugar

## 2022-07-16 DIAGNOSIS — Z1231 Encounter for screening mammogram for malignant neoplasm of breast: Secondary | ICD-10-CM | POA: Diagnosis not present

## 2022-07-22 ENCOUNTER — Encounter: Payer: Self-pay | Admitting: Cardiology

## 2022-07-23 ENCOUNTER — Encounter (HOSPITAL_COMMUNITY): Payer: Self-pay | Admitting: Anesthesiology

## 2022-07-23 ENCOUNTER — Encounter (HOSPITAL_COMMUNITY): Admission: RE | Payer: Self-pay | Source: Home / Self Care

## 2022-07-23 ENCOUNTER — Ambulatory Visit (HOSPITAL_COMMUNITY): Admission: RE | Admit: 2022-07-23 | Payer: Medicare PPO | Source: Home / Self Care | Admitting: Internal Medicine

## 2022-07-23 ENCOUNTER — Telehealth: Payer: Self-pay | Admitting: Cardiology

## 2022-07-23 SURGERY — CARDIOVERSION
Anesthesia: Monitor Anesthesia Care

## 2022-07-23 NOTE — Anesthesia Preprocedure Evaluation (Signed)
Anesthesia Evaluation  Patient identified by MRN, date of birth, ID band Patient awake    Reviewed: Allergy & Precautions, NPO status , Patient's Chart, lab work & pertinent test results  Airway Mallampati: II  TM Distance: >3 FB Neck ROM: Full    Dental no notable dental hx.    Pulmonary neg pulmonary ROS,    Pulmonary exam normal breath sounds clear to auscultation       Cardiovascular negative cardio ROS Normal cardiovascular exam Rhythm:Irregular Rate:Normal     Neuro/Psych CVA negative psych ROS   GI/Hepatic Neg liver ROS, GERD  ,  Endo/Other  Hypothyroidism   Renal/GU negative Renal ROS  negative genitourinary   Musculoskeletal negative musculoskeletal ROS (+)   Abdominal   Peds negative pediatric ROS (+)  Hematology negative hematology ROS (+)   Anesthesia Other Findings   Reproductive/Obstetrics negative OB ROS                             Anesthesia Physical Anesthesia Plan  ASA: 3  Anesthesia Plan: General   Post-op Pain Management: Minimal or no pain anticipated   Induction: Intravenous  PONV Risk Score and Plan: 3 and Treatment may vary due to age or medical condition  Airway Management Planned: Mask  Additional Equipment:   Intra-op Plan:   Post-operative Plan: Extubation in OR  Informed Consent: I have reviewed the patients History and Physical, chart, labs and discussed the procedure including the risks, benefits and alternatives for the proposed anesthesia with the patient or authorized representative who has indicated his/her understanding and acceptance.     Dental advisory given  Plan Discussed with: CRNA and Surgeon  Anesthesia Plan Comments:         Anesthesia Quick Evaluation

## 2022-07-23 NOTE — Telephone Encounter (Signed)
Pt sent another mychart message and stated "nevermind"  she stated that she assumes that Pam will call her to schedule the Cardioversion.  Just fyi.

## 2022-07-23 NOTE — Telephone Encounter (Signed)
Pt sent this via Mychart to the scheduling pool:    Why do I have a visit with Dr Marlou Porch instead of cardio version at [facility]?

## 2022-07-26 DIAGNOSIS — Z03818 Encounter for observation for suspected exposure to other biological agents ruled out: Secondary | ICD-10-CM | POA: Diagnosis not present

## 2022-07-26 DIAGNOSIS — R5383 Other fatigue: Secondary | ICD-10-CM | POA: Diagnosis not present

## 2022-07-26 DIAGNOSIS — R062 Wheezing: Secondary | ICD-10-CM | POA: Diagnosis not present

## 2022-07-26 DIAGNOSIS — R0981 Nasal congestion: Secondary | ICD-10-CM | POA: Diagnosis not present

## 2022-07-26 DIAGNOSIS — J208 Acute bronchitis due to other specified organisms: Secondary | ICD-10-CM | POA: Diagnosis not present

## 2022-07-26 DIAGNOSIS — R051 Acute cough: Secondary | ICD-10-CM | POA: Diagnosis not present

## 2022-07-29 ENCOUNTER — Other Ambulatory Visit: Payer: Self-pay | Admitting: Cardiology

## 2022-07-31 ENCOUNTER — Emergency Department (HOSPITAL_COMMUNITY): Payer: Medicare PPO

## 2022-07-31 ENCOUNTER — Emergency Department (HOSPITAL_COMMUNITY)
Admission: EM | Admit: 2022-07-31 | Discharge: 2022-07-31 | Disposition: A | Discharge status: Home / Self Care | Payer: Medicare PPO | Attending: Emergency Medicine | Admitting: Emergency Medicine

## 2022-07-31 ENCOUNTER — Other Ambulatory Visit: Payer: Self-pay

## 2022-07-31 ENCOUNTER — Encounter (HOSPITAL_COMMUNITY): Payer: Self-pay

## 2022-07-31 DIAGNOSIS — E876 Hypokalemia: Secondary | ICD-10-CM | POA: Diagnosis not present

## 2022-07-31 DIAGNOSIS — R5383 Other fatigue: Secondary | ICD-10-CM | POA: Diagnosis present

## 2022-07-31 DIAGNOSIS — R059 Cough, unspecified: Secondary | ICD-10-CM | POA: Insufficient documentation

## 2022-07-31 DIAGNOSIS — Z7901 Long term (current) use of anticoagulants: Secondary | ICD-10-CM | POA: Insufficient documentation

## 2022-07-31 DIAGNOSIS — I959 Hypotension, unspecified: Secondary | ICD-10-CM | POA: Diagnosis not present

## 2022-07-31 DIAGNOSIS — J9811 Atelectasis: Secondary | ICD-10-CM | POA: Diagnosis not present

## 2022-07-31 DIAGNOSIS — Z1152 Encounter for screening for COVID-19: Secondary | ICD-10-CM | POA: Diagnosis not present

## 2022-07-31 DIAGNOSIS — R062 Wheezing: Secondary | ICD-10-CM | POA: Diagnosis not present

## 2022-07-31 DIAGNOSIS — I480 Paroxysmal atrial fibrillation: Secondary | ICD-10-CM | POA: Diagnosis not present

## 2022-07-31 DIAGNOSIS — R0602 Shortness of breath: Secondary | ICD-10-CM | POA: Diagnosis not present

## 2022-07-31 DIAGNOSIS — I4891 Unspecified atrial fibrillation: Secondary | ICD-10-CM

## 2022-07-31 DIAGNOSIS — R531 Weakness: Secondary | ICD-10-CM | POA: Diagnosis not present

## 2022-07-31 LAB — COMPREHENSIVE METABOLIC PANEL
ALT: 31 U/L (ref 0–44)
AST: 32 U/L (ref 15–41)
Albumin: 3.5 g/dL (ref 3.5–5.0)
Alkaline Phosphatase: 61 U/L (ref 38–126)
Anion gap: 12 (ref 5–15)
BUN: 28 mg/dL — ABNORMAL HIGH (ref 8–23)
CO2: 27 mmol/L (ref 22–32)
Calcium: 9.2 mg/dL (ref 8.9–10.3)
Chloride: 99 mmol/L (ref 98–111)
Creatinine, Ser: 0.85 mg/dL (ref 0.44–1.00)
GFR, Estimated: >60 mL/min (ref >60)
Glucose, Bld: 121 mg/dL — ABNORMAL HIGH (ref 70–99)
Potassium: 3.3 mmol/L — ABNORMAL LOW (ref 3.5–5.1)
Sodium: 138 mmol/L (ref 135–145)
Total Bilirubin: 1 mg/dL (ref 0.3–1.2)
Total Protein: 7.4 g/dL (ref 6.5–8.1)

## 2022-07-31 LAB — RESP PANEL BY RT-PCR (FLU A&B, COVID) ARPGX2
Influenza A by PCR: NEGATIVE
Influenza B by PCR: NEGATIVE
SARS Coronavirus 2 by RT PCR: NEGATIVE

## 2022-07-31 LAB — CBC WITH DIFFERENTIAL/PLATELET
Abs Immature Granulocytes: 0.02 10*3/uL (ref 0.00–0.07)
Basophils Absolute: 0 10*3/uL (ref 0.0–0.1)
Basophils Relative: 0 %
Eosinophils Absolute: 0 10*3/uL (ref 0.0–0.5)
Eosinophils Relative: 0 %
HCT: 38.2 % (ref 36.0–46.0)
Hemoglobin: 12.2 g/dL (ref 12.0–15.0)
Immature Granulocytes: 0 %
Lymphocytes Relative: 9 %
Lymphs Abs: 0.7 10*3/uL (ref 0.7–4.0)
MCH: 29.7 pg (ref 26.0–34.0)
MCHC: 31.9 g/dL (ref 30.0–36.0)
MCV: 92.9 fL (ref 80.0–100.0)
Monocytes Absolute: 0.5 10*3/uL (ref 0.1–1.0)
Monocytes Relative: 7 %
Neutro Abs: 6.4 10*3/uL (ref 1.7–7.7)
Neutrophils Relative %: 84 %
Platelets: 269 10*3/uL (ref 150–400)
RBC: 4.11 MIL/uL (ref 3.87–5.11)
RDW: 14.5 % (ref 11.5–15.5)
WBC: 7.7 10*3/uL (ref 4.0–10.5)
nRBC: 0 % (ref 0.0–0.2)

## 2022-07-31 LAB — BRAIN NATRIURETIC PEPTIDE: B Natriuretic Peptide: 456.8 pg/mL — ABNORMAL HIGH (ref 0.0–100.0)

## 2022-07-31 LAB — MAGNESIUM: Magnesium: 1.9 mg/dL (ref 1.7–2.4)

## 2022-07-31 MED ORDER — METOPROLOL TARTRATE 25 MG PO TABS
12.5000 mg | ORAL_TABLET | Freq: Once | ORAL | Status: AC
  Administered 2022-07-31: 12.5 mg via ORAL
  Filled 2022-07-31: qty 1

## 2022-07-31 MED ORDER — IOHEXOL 350 MG/ML SOLN
80.0000 mL | Freq: Once | INTRAVENOUS | Status: AC | PRN
  Administered 2022-07-31: 80 mL via INTRAVENOUS

## 2022-07-31 MED ORDER — DILTIAZEM HCL 25 MG/5ML IV SOLN
10.0000 mg | Freq: Once | INTRAVENOUS | Status: AC
  Administered 2022-07-31: 10 mg via INTRAVENOUS
  Filled 2022-07-31: qty 5

## 2022-07-31 MED ORDER — METOPROLOL TARTRATE 25 MG PO TABS: 12.5000 mg | ORAL_TABLET | Freq: Two times a day (BID) | ORAL | 0 refills | Status: AC

## 2022-07-31 MED ORDER — POTASSIUM CHLORIDE CRYS ER 20 MEQ PO TBCR
20.0000 meq | EXTENDED_RELEASE_TABLET | Freq: Once | ORAL | Status: AC
  Administered 2022-07-31: 20 meq via ORAL
  Filled 2022-07-31: qty 1

## 2022-07-31 NOTE — ED Triage Notes (Signed)
Pt to er, family with pt, states that pt is here for low blood pressure and a fib, states that she is scheduled for a cardioversion on the 6th of November, states that pt went to urgent care a week ago for a cold and was given meds, states that she is here today because she is feeling worse and more weak.

## 2022-07-31 NOTE — Discharge Instructions (Addendum)
We have started you on medication to help with your heart rate control.  Take next dose tomorrow morning.  Follow-up with cardiology team.  I have referred you to atrial fibrillation clinic for sooner follow-up.  Please return if you have worsening symptoms or heart rate persistently above the 140s.

## 2022-07-31 NOTE — ED Notes (Signed)
Patient transported to CT 

## 2022-07-31 NOTE — ED Provider Notes (Signed)
Patient signed out to me for reevaluation after given diltiazem IV and Lopressor orally.  History of A-fib on anticoagulation but not rate controlled.  She has had work-up including blood work and CT scan that were unremarkable.  No PE.  She is asymptomatic.  Heart rate has been in the 90s she is asymptomatic now.  Cardiology has already been consulted and will start patient on Lopressor 12.5 mg twice a day and have her follow-up outpatient.  She is set for cardioversion next month as she had missed some doses of her blood thinner recently.  Patient discharged in good condition.  Understands return precautions.  This chart was dictated using voice recognition software.  Despite best efforts to proofread,  errors can occur which can change the documentation meaning.    Lennice Sites, DO 07/31/22 1845

## 2022-07-31 NOTE — ED Provider Notes (Signed)
[facility] DEPT Provider Note   CSN: [id number] Arrival date & time: 07/31/22  1351     History  Chief Complaint  Patient presents with   Atrial Fibrillation    Patricia [name] is a 88 y.o. female.   Atrial Fibrillation     88 year old female with a history of paroxysmal atrial fibrillation on anticoagulation who presents to the emergency department with fatigue, productive cough, shortness of breath, generalized weakness.  The patient states that she has had symptoms for the last 9 days or so.  She was diagnosed with bronchitis and treated with oral prednisone and been given albuterol for wheezing.  She states that she still feels fatigued.  She endorses some palpitations.  She denies any chest pain.  She has a scheduled cardioversion for paroxysmal atrial fibrillation for 11/7.  She had previously been scheduled for 10/17 but missed a dose of her Eliquis and this was canceled and rescheduled for this month.  She was last seen in clinic by Dr. Marlou Porch of cardiology on 07/12/2022.  She states that she has been compliant with her anticoagulation.  Home Medications Prior to Admission medications   Medication Sig Start Date End Date Taking? Authorizing Provider  Acetaminophen (TYLENOL) 325 MG CAPS Take 1 tablet by mouth every 6 (six) hours as needed (pain).   Yes [provider]  albuterol (VENTOLIN HFA) 108 (90 Base) MCG/ACT inhaler Inhale 2 puffs into the lungs every 6 (six) hours as needed for wheezing or shortness of breath.   Yes [provider]  apixaban (ELIQUIS) 5 MG TABS tablet Take 5 mg by mouth 2 (two) times daily.   Yes [provider]  ascorbic acid (VITAMIN C) 500 MG tablet Take 500 mg by mouth daily.   Yes [provider]  atorvastatin (LIPITOR) 10 MG tablet Take 10 mg by mouth daily. 09/26/20  Yes [provider]  benzonatate (TESSALON) 200 MG capsule Take 200 mg by mouth 3 (three) times daily.  07/27/22  Yes [provider]  CALCIUM PO Take 500 mg by mouth 2 (two) times daily.   Yes [provider]  cholecalciferol (VITAMIN D) 1000 units tablet Take 1,000 Units by mouth daily.   Yes [provider]  denosumab (PROLIA) 60 MG/ML SOSY injection Inject 60 mg into the skin every 6 (six) months.   Yes [provider]  furosemide (LASIX) 20 MG tablet Take 1 tablet (20 mg total) by mouth daily. 07/12/22  Yes Jerline Pain, MD  levothyroxine (SYNTHROID, LEVOTHROID) 88 MCG tablet Take 88 mcg by mouth daily before breakfast.   Yes [provider]  MELATONIN PO Take 10 mg by mouth at bedtime.   Yes [provider]  metoprolol tartrate (LOPRESSOR) 25 MG tablet Take 0.5 tablets (12.5 mg total) by mouth 2 (two) times daily. 07/31/22 08/30/22 Yes Curatolo, Adam, DO  pantoprazole (PROTONIX) 40 MG tablet Take 40 mg by mouth 2 (two) times daily. 12/06/20  Yes [provider]  polyethylene glycol (MIRALAX) 17 g packet Take 17 g by mouth as needed for mild constipation or moderate constipation.   Yes [provider]  sertraline (ZOLOFT) 50 MG tablet Take 50 mg by mouth daily.    Yes [provider]  vitamin B-12 (CYANOCOBALAMIN) 1000 MCG tablet Take 1,000 mcg by mouth daily.   Yes [provider]  methylPREDNISolone (MEDROL DOSEPAK) 4 MG TBPK tablet Take by mouth as directed. Patient not taking: Reported on 07/31/2022 07/27/22  [provider]      Allergies    Ciprofloxacin and Fosamax [alendronate sodium]    Review of Systems   Review of Systems  All other systems reviewed and are negative.   Physical Exam Updated Vital Signs BP 113/80   Pulse 100   Temp 98.4 F (36.9 C) (Oral)   Resp 16   Ht '5\' 7"'$  (1.702 m)   Wt 70.8 kg   SpO2 92%   BMI 24.43 kg/m  Physical Exam Vitals and nursing note reviewed.  Constitutional:      General: She is not in acute distress.    Appearance: She is  well-developed.  HENT:     Head: Normocephalic and atraumatic.  Eyes:     Conjunctiva/sclera: Conjunctivae normal.  Cardiovascular:     Rate and Rhythm: Tachycardia present. Rhythm irregular.     Pulses: Normal pulses.  Pulmonary:     Effort: Pulmonary effort is normal. No respiratory distress.     Breath sounds: Normal breath sounds.  Abdominal:     Palpations: Abdomen is soft.     Tenderness: There is no abdominal tenderness.  Musculoskeletal:        General: No swelling.     Cervical back: Neck supple.  Skin:    General: Skin is warm and dry.     Capillary Refill: Capillary refill takes less than 2 seconds.  Neurological:     Mental Status: She is alert.  Psychiatric:        Mood and Affect: Mood normal.     ED Results / Procedures / Treatments   Labs (all labs ordered are listed, but only abnormal results are displayed) Labs Reviewed  COMPREHENSIVE METABOLIC PANEL - Abnormal; Notable for the following components:      Result Value   Potassium 3.3 (*)    Glucose, Bld 121 (*)    BUN 28 (*)    All other components within normal limits  BRAIN NATRIURETIC PEPTIDE - Abnormal; Notable for the following components:   B Natriuretic Peptide 456.8 (*)    All other components within normal limits  RESP PANEL BY RT-PCR (FLU A&B, COVID) ARPGX2  CBC WITH DIFFERENTIAL/PLATELET  MAGNESIUM    EKG EKG Interpretation  Date/Time:  Wednesday July 31 2022 14:14:55 EDT Ventricular Rate:  121 PR Interval:    QRS Duration: 96 QT Interval:  323 QTC Calculation: 459 R Axis:   134 Text Interpretation: Atrial fibrillation with rapid ventricular response Right axis deviation Low voltage, extremity leads ST depression, probably rate related Confirmed by Regan Lemming ([id number]) on 07/31/2022 3:27:28 PM  Radiology CT Angio Chest PE W and/or Wo Contrast  Result Date: 07/31/2022 CLINICAL DATA:  Cough, shortness of breath, weakness and wheezing. EXAM: CT ANGIOGRAPHY CHEST WITH CONTRAST  TECHNIQUE: Multidetector CT imaging of the chest was performed using the standard protocol during bolus administration of intravenous contrast. Multiplanar CT image reconstructions and MIPs were obtained to evaluate the vascular anatomy. RADIATION DOSE REDUCTION: This exam was performed according to the departmental dose-optimization program which includes automated exposure control, adjustment of the mA and/or kV according to patient size and/or use of iterative reconstruction technique. CONTRAST:  32m OMNIPAQUE IOHEXOL 350 MG/ML SOLN COMPARISON:  None Available. FINDINGS: Cardiovascular: Pulmonary arteries are adequately opacified. There is no evidence of pulmonary embolism. There is significant dilatation central pulmonary arteries and right-sided cardiac chambers consistent with underlying pulmonary hypertension. The main pulmonary artery measures up to 4 cm. Right heart failure likely present with reflux of  contrast into the intrahepatic IVC and hepatic veins. The heart is significantly enlarged. No pericardial fluid identified. Scattered calcified coronary artery plaque present. Mediastinum/Nodes: No enlarged mediastinal, hilar, or axillary lymph nodes. Thyroid gland, trachea, and esophagus demonstrate no significant findings. Lungs/Pleura: There are some scattered areas of mucous plugging in the posterior lower lobes bilaterally with associated areas of atelectasis. No overt pulmonary edema, pneumothorax or pleural fluid identified. Upper Abdomen: No acute abnormality. Musculoskeletal: Moderate osteopenic compression fracture of the T10 vertebral body with approximately 70% loss of mid vertebral body height and mild compression of the T12 vertebral body with approximately 50-60% loss of anterior vertebral body height. Review of the MIP images confirms the above findings. IMPRESSION: 1. No evidence of pulmonary embolism. 2. Evidence of pulmonary hypertension with significant dilatation of the central  pulmonary arteries and right-sided cardiac chambers. Right heart failure likely present with reflux of contrast into the intrahepatic IVC and hepatic veins. 3. Significant cardiomegaly. 4. Coronary atherosclerosis. 5. Moderate osteopenic compression fractures of the T10 and T12 vertebral bodies. 6. Scattered areas of mucous plugging in the posterior lower lobes bilaterally with associated areas of atelectasis. Electronically Signed   By: Aletta Edouard M.D.   On: 07/31/2022 16:55   DG Chest 2 View  Result Date: 07/31/2022 CLINICAL DATA:  Shortness of breath. Low blood pressure. Atrial fibrillation. EXAM: CHEST - 2 VIEW COMPARISON:  Radiographs 06/29/2022 and 12/18/2020. FINDINGS: Stable mild cardiac enlargement and aortic atherosclerosis. The pulmonary vascularity remains normal, and there is no evidence of edema, confluent airspace opacity or pleural effusion. There is no pneumothorax. Stable thoracolumbar compression deformities are noted on the lateral view. Fractures at T12 and L2 appear grossly unchanged. Interval fracture at T10, age indeterminate. IMPRESSION: 1. No evidence of acute cardiopulmonary process. 2. Interval T10 compression fracture, age indeterminate. Other stable thoracolumbar compression deformities. Electronically Signed   By: Richardean Sale M.D.   On: 07/31/2022 15:21    Procedures Procedures    Medications Ordered in ED Medications  diltiazem (CARDIZEM) injection 10 mg (10 mg Intravenous Given 07/31/22 1534)  potassium chloride SA (KLOR-CON M) CR tablet 20 mEq (20 mEq Oral Given 07/31/22 1705)  iohexol (OMNIPAQUE) 350 MG/ML injection 80 mL (80 mLs Intravenous Contrast Given 07/31/22 1624)  metoprolol tartrate (LOPRESSOR) tablet 12.5 mg (12.5 mg Oral Given 07/31/22 1705)    ED Course/ Medical Decision Making/ A&P                           Medical Decision Making Amount and/or Complexity of Data Reviewed Labs: ordered. Radiology: ordered.  Risk Prescription drug  management.     88 year old female with a history of paroxysmal atrial fibrillation on anticoagulation who presents to the emergency department with fatigue, productive cough, shortness of breath, generalized weakness.  The patient states that she has had symptoms for the last 9 days or so.  She was diagnosed with bronchitis and treated with oral prednisone and been given albuterol for wheezing.  She states that she still feels fatigued.  She endorses some palpitations.  She denies any chest pain.  She has a scheduled cardioversion for paroxysmal atrial fibrillation for 11/7.  She had previously been scheduled for 10/17 but missed a dose of her Eliquis and this was canceled and rescheduled for this month.  She was last seen in clinic by Dr. Marlou Porch of cardiology on 07/12/2022.  She states that she has been compliant with her anticoagulation.  On arrival, the  patient was afebrile, in atrial fibrillation, ventricular rates ranged between the mid 90s to the 110s and RVR.  EKG was performed which revealed atrial fibrillation with RVR with a ventricular rate of 121.  Patient is on a rate control at home.  She did miss a dose of her Eliquis earlier this month.  Considered PE as etiology of her cough and shortness of breath resulting in atrial fibrillation.  Initial chest x-ray revealed the following: IMPRESSION:  1. No evidence of acute cardiopulmonary process.  2. Interval T10 compression fracture, age indeterminate. Other  stable thoracolumbar compression deformities.    CTA PE: Ordered and pending  Laboratory evaluation significant for CBC without leukocytosis or anemia, BMP nonspecific etiology before 57, CMP with mild hypokalemia to 3.3, no AKI, normal LFTs, COVID-19 influenza PCR testing collected and pending, magnesium normal.  I did speak with on-call cardiology who recommended initiation of some rate control.  Discussed with the patient risks and benefits of cardioversion in the ED which versus  follow-up with cardioversion outpatient.  The patient was administered IV diltiazem and oral Lopressor 12.5 mg.  Plan at time of signout to reassess the patient, follow-up CTA imaging, consider likely discharge home with a plan for follow-up outpatient with cardiology for her scheduled cardioversion.  Signout given to Dr. Ronnald Nian at [phone].   Final Clinical Impression(s) / ED Diagnoses Final diagnoses:  Atrial fibrillation with RVR ([place])    Rx / DC Orders ED Discharge Orders          Ordered    Amb referral to AFIB Clinic        07/31/22 1843    metoprolol tartrate (LOPRESSOR) 25 MG tablet  2 times daily        07/31/22 1844              Regan Lemming, MD 08/01/22 1623

## 2022-07-31 NOTE — ED Provider Triage Note (Signed)
Emergency Medicine Provider Triage Evaluation Note  Patricia [name] , a 88 y.o. female  was evaluated in triage.  Pt complains of cough, shortness of breath, generalized weakness and wheeze.  Patient states that symptoms have been present for the past 9 days.  She was seen outpatient at a urgent care and was diagnosed with bronchitis and was given oral prednisone as well as albuterol breathing treatments.  She states that said therapy has not helped her symptoms.  She reports to the emergency department for reevaluation.  Daughter accompanies patient states that she has not been eating or sleeping at night.  Patient has a history of atrial fibrillation as well as hypotension of which has not changed over the past month.  She is scheduled for cardioversion on November 6.  Currently denies chest pain, fever, chills, night sweats, abdominal pain, nausea, vomiting, urinary symptoms, change in bowel habits..  Review of Systems  Positive: See above Negative:   Physical Exam  BP 106/74   Pulse 65   Temp 98.6 F (37 C) (Oral)   Resp 18   Ht '5\' 7"'$  (1.702 m)   Wt 70.8 kg   SpO2 91%   BMI 24.43 kg/m  Gen:   Awake, no distress   Resp:  Normal effort  MSK:   Moves extremities without difficulty  Other:    Medical Decision Making  Medically screening exam initiated at 2:31 PM.  Appropriate orders placed.  Audrea Muscat Alberto was informed that the remainder of the evaluation will be completed by another provider, this initial triage assessment does not replace that evaluation, and the importance of remaining in the ED until their evaluation is complete.     Wilnette Kales, [place] 07/31/22 1432

## 2022-08-09 ENCOUNTER — Inpatient Hospital Stay (HOSPITAL_COMMUNITY)
Admission: EM | Admit: 2022-08-09 | Discharge: 2022-09-06 | DRG: 870 | Disposition: E | Payer: Medicare PPO | Attending: Pulmonary Disease | Admitting: Pulmonary Disease

## 2022-08-09 ENCOUNTER — Encounter (HOSPITAL_COMMUNITY): Payer: Self-pay

## 2022-08-09 ENCOUNTER — Ambulatory Visit: Payer: Medicare PPO

## 2022-08-09 ENCOUNTER — Encounter: Payer: Self-pay | Admitting: Cardiology

## 2022-08-09 ENCOUNTER — Emergency Department (HOSPITAL_COMMUNITY): Payer: Medicare PPO

## 2022-08-09 ENCOUNTER — Other Ambulatory Visit: Payer: Self-pay

## 2022-08-09 DIAGNOSIS — E8809 Other disorders of plasma-protein metabolism, not elsewhere classified: Secondary | ICD-10-CM | POA: Diagnosis present

## 2022-08-09 DIAGNOSIS — I959 Hypotension, unspecified: Secondary | ICD-10-CM

## 2022-08-09 DIAGNOSIS — J9601 Acute respiratory failure with hypoxia: Secondary | ICD-10-CM | POA: Diagnosis present

## 2022-08-09 DIAGNOSIS — F32A Depression, unspecified: Secondary | ICD-10-CM | POA: Diagnosis present

## 2022-08-09 DIAGNOSIS — I517 Cardiomegaly: Secondary | ICD-10-CM | POA: Diagnosis not present

## 2022-08-09 DIAGNOSIS — I4891 Unspecified atrial fibrillation: Secondary | ICD-10-CM | POA: Diagnosis present

## 2022-08-09 DIAGNOSIS — N179 Acute kidney failure, unspecified: Secondary | ICD-10-CM | POA: Diagnosis not present

## 2022-08-09 DIAGNOSIS — E039 Hypothyroidism, unspecified: Secondary | ICD-10-CM | POA: Diagnosis not present

## 2022-08-09 DIAGNOSIS — L89159 Pressure ulcer of sacral region, unspecified stage: Secondary | ICD-10-CM | POA: Diagnosis present

## 2022-08-09 DIAGNOSIS — I4901 Ventricular fibrillation: Secondary | ICD-10-CM | POA: Diagnosis not present

## 2022-08-09 DIAGNOSIS — Z79899 Other long term (current) drug therapy: Secondary | ICD-10-CM

## 2022-08-09 DIAGNOSIS — J189 Pneumonia, unspecified organism: Principal | ICD-10-CM | POA: Diagnosis present

## 2022-08-09 DIAGNOSIS — I480 Paroxysmal atrial fibrillation: Secondary | ICD-10-CM | POA: Diagnosis not present

## 2022-08-09 DIAGNOSIS — Z515 Encounter for palliative care: Secondary | ICD-10-CM

## 2022-08-09 DIAGNOSIS — I2781 Cor pulmonale (chronic): Secondary | ICD-10-CM | POA: Diagnosis present

## 2022-08-09 DIAGNOSIS — Z66 Do not resuscitate: Secondary | ICD-10-CM | POA: Diagnosis not present

## 2022-08-09 DIAGNOSIS — R6521 Severe sepsis with septic shock: Secondary | ICD-10-CM | POA: Diagnosis present

## 2022-08-09 DIAGNOSIS — I700 Atherosclerosis of aorta: Secondary | ICD-10-CM | POA: Diagnosis not present

## 2022-08-09 DIAGNOSIS — I50811 Acute right heart failure: Secondary | ICD-10-CM | POA: Diagnosis not present

## 2022-08-09 DIAGNOSIS — I9589 Other hypotension: Secondary | ICD-10-CM | POA: Diagnosis not present

## 2022-08-09 DIAGNOSIS — A419 Sepsis, unspecified organism: Principal | ICD-10-CM | POA: Diagnosis present

## 2022-08-09 DIAGNOSIS — R570 Cardiogenic shock: Secondary | ICD-10-CM | POA: Diagnosis present

## 2022-08-09 DIAGNOSIS — R0602 Shortness of breath: Secondary | ICD-10-CM | POA: Diagnosis not present

## 2022-08-09 DIAGNOSIS — E440 Moderate protein-calorie malnutrition: Secondary | ICD-10-CM | POA: Diagnosis present

## 2022-08-09 DIAGNOSIS — E7800 Pure hypercholesterolemia, unspecified: Secondary | ICD-10-CM | POA: Diagnosis present

## 2022-08-09 DIAGNOSIS — E8770 Fluid overload, unspecified: Secondary | ICD-10-CM | POA: Diagnosis not present

## 2022-08-09 DIAGNOSIS — R0603 Acute respiratory distress: Secondary | ICD-10-CM | POA: Diagnosis not present

## 2022-08-09 DIAGNOSIS — J90 Pleural effusion, not elsewhere classified: Secondary | ICD-10-CM | POA: Diagnosis not present

## 2022-08-09 DIAGNOSIS — I071 Rheumatic tricuspid insufficiency: Secondary | ICD-10-CM | POA: Diagnosis present

## 2022-08-09 DIAGNOSIS — Z8673 Personal history of transient ischemic attack (TIA), and cerebral infarction without residual deficits: Secondary | ICD-10-CM

## 2022-08-09 DIAGNOSIS — Z79890 Hormone replacement therapy (postmenopausal): Secondary | ICD-10-CM

## 2022-08-09 DIAGNOSIS — K219 Gastro-esophageal reflux disease without esophagitis: Secondary | ICD-10-CM | POA: Diagnosis present

## 2022-08-09 DIAGNOSIS — I5041 Acute combined systolic (congestive) and diastolic (congestive) heart failure: Secondary | ICD-10-CM | POA: Diagnosis not present

## 2022-08-09 DIAGNOSIS — Z881 Allergy status to other antibiotic agents status: Secondary | ICD-10-CM

## 2022-08-09 DIAGNOSIS — I5082 Biventricular heart failure: Secondary | ICD-10-CM | POA: Diagnosis present

## 2022-08-09 DIAGNOSIS — K5900 Constipation, unspecified: Secondary | ICD-10-CM | POA: Diagnosis not present

## 2022-08-09 DIAGNOSIS — I493 Ventricular premature depolarization: Secondary | ICD-10-CM | POA: Diagnosis not present

## 2022-08-09 DIAGNOSIS — I4811 Longstanding persistent atrial fibrillation: Secondary | ICD-10-CM

## 2022-08-09 DIAGNOSIS — R918 Other nonspecific abnormal finding of lung field: Secondary | ICD-10-CM | POA: Diagnosis not present

## 2022-08-09 DIAGNOSIS — L8990 Pressure ulcer of unspecified site, unspecified stage: Secondary | ICD-10-CM | POA: Insufficient documentation

## 2022-08-09 DIAGNOSIS — J181 Lobar pneumonia, unspecified organism: Secondary | ICD-10-CM | POA: Diagnosis not present

## 2022-08-09 DIAGNOSIS — I471 Supraventricular tachycardia, unspecified: Secondary | ICD-10-CM | POA: Diagnosis not present

## 2022-08-09 DIAGNOSIS — E871 Hypo-osmolality and hyponatremia: Secondary | ICD-10-CM | POA: Diagnosis not present

## 2022-08-09 DIAGNOSIS — B9710 Unspecified enterovirus as the cause of diseases classified elsewhere: Secondary | ICD-10-CM | POA: Diagnosis present

## 2022-08-09 DIAGNOSIS — R5381 Other malaise: Secondary | ICD-10-CM | POA: Diagnosis present

## 2022-08-09 DIAGNOSIS — I472 Ventricular tachycardia, unspecified: Secondary | ICD-10-CM | POA: Diagnosis not present

## 2022-08-09 DIAGNOSIS — I2729 Other secondary pulmonary hypertension: Secondary | ICD-10-CM | POA: Diagnosis present

## 2022-08-09 DIAGNOSIS — E876 Hypokalemia: Secondary | ICD-10-CM | POA: Diagnosis not present

## 2022-08-09 DIAGNOSIS — F419 Anxiety disorder, unspecified: Secondary | ICD-10-CM | POA: Diagnosis present

## 2022-08-09 DIAGNOSIS — Z6824 Body mass index (BMI) 24.0-24.9, adult: Secondary | ICD-10-CM

## 2022-08-09 DIAGNOSIS — I469 Cardiac arrest, cause unspecified: Secondary | ICD-10-CM | POA: Diagnosis not present

## 2022-08-09 DIAGNOSIS — Z8249 Family history of ischemic heart disease and other diseases of the circulatory system: Secondary | ICD-10-CM

## 2022-08-09 DIAGNOSIS — Z7901 Long term (current) use of anticoagulants: Secondary | ICD-10-CM

## 2022-08-09 DIAGNOSIS — I639 Cerebral infarction, unspecified: Secondary | ICD-10-CM | POA: Diagnosis not present

## 2022-08-09 DIAGNOSIS — B9789 Other viral agents as the cause of diseases classified elsewhere: Secondary | ICD-10-CM | POA: Diagnosis present

## 2022-08-09 DIAGNOSIS — E8779 Other fluid overload: Secondary | ICD-10-CM | POA: Diagnosis not present

## 2022-08-09 DIAGNOSIS — I429 Cardiomyopathy, unspecified: Secondary | ICD-10-CM | POA: Diagnosis not present

## 2022-08-09 DIAGNOSIS — I462 Cardiac arrest due to underlying cardiac condition: Secondary | ICD-10-CM | POA: Diagnosis not present

## 2022-08-09 DIAGNOSIS — R4182 Altered mental status, unspecified: Secondary | ICD-10-CM | POA: Diagnosis not present

## 2022-08-09 DIAGNOSIS — I5023 Acute on chronic systolic (congestive) heart failure: Secondary | ICD-10-CM | POA: Diagnosis not present

## 2022-08-09 DIAGNOSIS — R54 Age-related physical debility: Secondary | ICD-10-CM | POA: Diagnosis present

## 2022-08-09 DIAGNOSIS — I4819 Other persistent atrial fibrillation: Secondary | ICD-10-CM | POA: Diagnosis not present

## 2022-08-09 DIAGNOSIS — J9690 Respiratory failure, unspecified, unspecified whether with hypoxia or hypercapnia: Secondary | ICD-10-CM | POA: Diagnosis not present

## 2022-08-09 DIAGNOSIS — Z888 Allergy status to other drugs, medicaments and biological substances status: Secondary | ICD-10-CM

## 2022-08-09 LAB — CBC WITH DIFFERENTIAL/PLATELET
Abs Immature Granulocytes: 0.08 10*3/uL — ABNORMAL HIGH (ref 0.00–0.07)
Basophils Absolute: 0 10*3/uL (ref 0.0–0.1)
Basophils Relative: 0 %
Eosinophils Absolute: 0 10*3/uL (ref 0.0–0.5)
Eosinophils Relative: 0 %
HCT: 40.9 % (ref 36.0–46.0)
Hemoglobin: 12.8 g/dL (ref 12.0–15.0)
Immature Granulocytes: 1 %
Lymphocytes Relative: 4 %
Lymphs Abs: 0.6 10*3/uL — ABNORMAL LOW (ref 0.7–4.0)
MCH: 29.6 pg (ref 26.0–34.0)
MCHC: 31.3 g/dL (ref 30.0–36.0)
MCV: 94.5 fL (ref 80.0–100.0)
Monocytes Absolute: 0.6 10*3/uL (ref 0.1–1.0)
Monocytes Relative: 4 %
Neutro Abs: 13.7 10*3/uL — ABNORMAL HIGH (ref 1.7–7.7)
Neutrophils Relative %: 91 %
Platelets: 229 10*3/uL (ref 150–400)
RBC: 4.33 MIL/uL (ref 3.87–5.11)
RDW: 14.8 % (ref 11.5–15.5)
WBC: 15 10*3/uL — ABNORMAL HIGH (ref 4.0–10.5)
nRBC: 0 % (ref 0.0–0.2)

## 2022-08-09 LAB — URINALYSIS, ROUTINE W REFLEX MICROSCOPIC
Bacteria, UA: NONE SEEN
Bilirubin Urine: NEGATIVE
Glucose, UA: NEGATIVE mg/dL
Hgb urine dipstick: NEGATIVE
Ketones, ur: NEGATIVE mg/dL
Nitrite: NEGATIVE
Protein, ur: NEGATIVE mg/dL
Specific Gravity, Urine: 1.016 (ref 1.005–1.030)
pH: 5 (ref 5.0–8.0)

## 2022-08-09 LAB — LACTIC ACID, PLASMA
Lactic Acid, Venous: 2.3 mmol/L (ref 0.5–1.9)
Lactic Acid, Venous: 1.5 mmol/L (ref 0.5–1.9)

## 2022-08-09 LAB — COMPREHENSIVE METABOLIC PANEL
ALT: 20 U/L (ref 0–44)
AST: 26 U/L (ref 15–41)
Albumin: 3.7 g/dL (ref 3.5–5.0)
Alkaline Phosphatase: 61 U/L (ref 38–126)
Anion gap: 12 (ref 5–15)
BUN: 18 mg/dL (ref 8–23)
CO2: 22 mmol/L (ref 22–32)
Calcium: 8.9 mg/dL (ref 8.9–10.3)
Chloride: 103 mmol/L (ref 98–111)
Creatinine, Ser: 0.75 mg/dL (ref 0.44–1.00)
GFR, Estimated: >60 mL/min (ref >60)
Glucose, Bld: 165 mg/dL — ABNORMAL HIGH (ref 70–99)
Potassium: 3 mmol/L — ABNORMAL LOW (ref 3.5–5.1)
Sodium: 137 mmol/L (ref 135–145)
Total Bilirubin: 0.8 mg/dL (ref 0.3–1.2)
Total Protein: 7.9 g/dL (ref 6.5–8.1)

## 2022-08-09 LAB — I-STAT CHEM 8, ED
BUN: 16 mg/dL (ref 8–23)
Calcium, Ion: 1.16 mmol/L (ref 1.15–1.40)
Chloride: 102 mmol/L (ref 98–111)
Creatinine, Ser: 0.9 mg/dL (ref 0.44–1.00)
Glucose, Bld: 178 mg/dL — ABNORMAL HIGH (ref 70–99)
HCT: 40 % (ref 36.0–46.0)
Hemoglobin: 13.6 g/dL (ref 12.0–15.0)
Potassium: 3.1 mmol/L — ABNORMAL LOW (ref 3.5–5.1)
Sodium: 142 mmol/L (ref 135–145)
TCO2: 25 mmol/L (ref 22–32)

## 2022-08-09 LAB — TROPONIN I (HIGH SENSITIVITY)
Troponin I (High Sensitivity): 14 ng/L (ref <18)
Troponin I (High Sensitivity): 15 ng/L (ref <18)

## 2022-08-09 LAB — STREP PNEUMONIAE URINARY ANTIGEN: Strep Pneumo Urinary Antigen: NEGATIVE

## 2022-08-09 LAB — BRAIN NATRIURETIC PEPTIDE: B Natriuretic Peptide: 418.6 pg/mL — ABNORMAL HIGH (ref 0.0–100.0)

## 2022-08-09 LAB — PROCALCITONIN: Procalcitonin: <0.1 ng/mL

## 2022-08-09 LAB — PROTIME-INR
INR: 2.1 — ABNORMAL HIGH (ref 0.8–1.2)
Prothrombin Time: 23.2 seconds — ABNORMAL HIGH (ref 11.4–15.2)

## 2022-08-09 MED ORDER — POTASSIUM CHLORIDE CRYS ER 20 MEQ PO TBCR
40.0000 meq | EXTENDED_RELEASE_TABLET | Freq: Once | ORAL | Status: AC
  Administered 2022-08-09: 40 meq via ORAL
  Filled 2022-08-09: qty 2

## 2022-08-09 MED ORDER — METOPROLOL TARTRATE 12.5 MG HALF TABLET
12.5000 mg | ORAL_TABLET | Freq: Two times a day (BID) | ORAL | Status: DC
  Administered 2022-08-09 (×2): 12.5 mg via ORAL
  Filled 2022-08-09 (×2): qty 1

## 2022-08-09 MED ORDER — SODIUM CHLORIDE 0.9% FLUSH
3.0000 mL | Freq: Two times a day (BID) | INTRAVENOUS | Status: DC
  Administered 2022-08-10 – 2022-08-18 (×17): 3 mL via INTRAVENOUS

## 2022-08-09 MED ORDER — POTASSIUM CHLORIDE CRYS ER 20 MEQ PO TBCR
40.0000 meq | EXTENDED_RELEASE_TABLET | Freq: Once | ORAL | Status: DC
  Filled 2022-08-09: qty 2

## 2022-08-09 MED ORDER — SODIUM CHLORIDE 0.9 % IV SOLN: 500.0000 mg | INTRAVENOUS | Status: DC

## 2022-08-09 MED ORDER — ACETAMINOPHEN 325 MG PO TABS
650.0000 mg | ORAL_TABLET | Freq: Four times a day (QID) | ORAL | Status: DC | PRN
  Administered 2022-08-09 – 2022-08-17 (×6): 650 mg via ORAL
  Filled 2022-08-09 (×6): qty 2

## 2022-08-09 MED ORDER — POLYETHYLENE GLYCOL 3350 17 G PO PACK: 17.0000 g | PACK | Freq: Every day | ORAL | Status: DC | PRN

## 2022-08-09 MED ORDER — ALBUTEROL SULFATE (2.5 MG/3ML) 0.083% IN NEBU: 2.5000 mg | INHALATION_SOLUTION | Freq: Four times a day (QID) | RESPIRATORY_TRACT | Status: DC | PRN

## 2022-08-09 MED ORDER — LACTATED RINGERS IV SOLN: INTRAVENOUS | Status: DC

## 2022-08-09 MED ORDER — SERTRALINE HCL 100 MG PO TABS
50.0000 mg | ORAL_TABLET | Freq: Every day | ORAL | Status: DC
  Administered 2022-08-09 – 2022-08-11 (×3): 50 mg via ORAL
  Filled 2022-08-09 (×3): qty 1

## 2022-08-09 MED ORDER — MELATONIN 5 MG PO TABS
5.0000 mg | ORAL_TABLET | Freq: Every day | ORAL | Status: DC
  Administered 2022-08-09 – 2022-08-11 (×3): 5 mg via ORAL
  Filled 2022-08-09 (×3): qty 1

## 2022-08-09 MED ORDER — APIXABAN 5 MG PO TABS
5.0000 mg | ORAL_TABLET | Freq: Two times a day (BID) | ORAL | Status: DC
  Administered 2022-08-09 – 2022-08-11 (×6): 5 mg via ORAL
  Filled 2022-08-09 (×6): qty 1

## 2022-08-09 MED ORDER — MAGNESIUM SULFATE IN D5W 1-5 GM/100ML-% IV SOLN
1.0000 g | Freq: Once | INTRAVENOUS | Status: AC
  Administered 2022-08-09: 1 g via INTRAVENOUS
  Filled 2022-08-09: qty 100

## 2022-08-09 MED ORDER — POTASSIUM CHLORIDE 10 MEQ/100ML IV SOLN
10.0000 meq | Freq: Once | INTRAVENOUS | Status: AC
  Administered 2022-08-09: 10 meq via INTRAVENOUS
  Filled 2022-08-09: qty 100

## 2022-08-09 MED ORDER — SODIUM CHLORIDE 0.9 % IV SOLN
2.0000 g | INTRAVENOUS | Status: AC
  Administered 2022-08-10 – 2022-08-13 (×4): 2 g via INTRAVENOUS
  Filled 2022-08-09 (×4): qty 20

## 2022-08-09 MED ORDER — ONDANSETRON HCL 4 MG/2ML IJ SOLN
4.0000 mg | Freq: Four times a day (QID) | INTRAMUSCULAR | Status: DC | PRN
  Administered 2022-08-09: 4 mg via INTRAVENOUS
  Filled 2022-08-09: qty 2

## 2022-08-09 MED ORDER — SODIUM CHLORIDE 0.9 % IV SOLN
1.0000 g | Freq: Once | INTRAVENOUS | Status: AC
  Administered 2022-08-09: 1 g via INTRAVENOUS
  Filled 2022-08-09: qty 10

## 2022-08-09 MED ORDER — SODIUM CHLORIDE 0.9 % IV SOLN
500.0000 mg | Freq: Once | INTRAVENOUS | Status: AC
  Administered 2022-08-09: 500 mg via INTRAVENOUS
  Filled 2022-08-09: qty 5

## 2022-08-09 MED ORDER — ATORVASTATIN CALCIUM 10 MG PO TABS
10.0000 mg | ORAL_TABLET | Freq: Every day | ORAL | Status: DC
  Administered 2022-08-09 – 2022-08-11 (×3): 10 mg via ORAL
  Filled 2022-08-09 (×3): qty 1

## 2022-08-09 MED ORDER — AZITHROMYCIN 250 MG PO TABS
500.0000 mg | ORAL_TABLET | Freq: Every day | ORAL | Status: DC
  Administered 2022-08-10 – 2022-08-11 (×2): 500 mg via ORAL
  Filled 2022-08-09 (×3): qty 2

## 2022-08-09 MED ORDER — ACETAMINOPHEN 650 MG RE SUPP: 650.0000 mg | Freq: Four times a day (QID) | RECTAL | Status: DC | PRN

## 2022-08-09 MED ORDER — ORAL CARE MOUTH RINSE: 15.0000 mL | OROMUCOSAL | Status: DC | PRN

## 2022-08-09 MED ORDER — PANTOPRAZOLE SODIUM 40 MG PO TBEC
40.0000 mg | DELAYED_RELEASE_TABLET | Freq: Two times a day (BID) | ORAL | Status: DC
  Administered 2022-08-09 – 2022-08-11 (×6): 40 mg via ORAL
  Filled 2022-08-09 (×6): qty 1

## 2022-08-09 MED ORDER — LEVOTHYROXINE SODIUM 88 MCG PO TABS
88.0000 ug | ORAL_TABLET | Freq: Every day | ORAL | Status: DC
  Administered 2022-08-09 – 2022-08-11 (×3): 88 ug via ORAL
  Filled 2022-08-09 (×3): qty 1

## 2022-08-09 MED ORDER — LACTATED RINGERS IV BOLUS (SEPSIS): 1000.0000 mL | Freq: Once | INTRAVENOUS | Status: DC

## 2022-08-09 MED ORDER — ALBUTEROL SULFATE HFA 108 (90 BASE) MCG/ACT IN AERS: 2.0000 | INHALATION_SPRAY | Freq: Four times a day (QID) | RESPIRATORY_TRACT | Status: DC | PRN

## 2022-08-09 MED ORDER — SODIUM CHLORIDE 0.9 % IV BOLUS
500.0000 mL | Freq: Once | INTRAVENOUS | Status: AC
  Administered 2022-08-09: 500 mL via INTRAVENOUS

## 2022-08-09 NOTE — ED Triage Notes (Signed)
Pt recently diagnosed with bronchitis, tonight woke up with CP, SOB, back pain and bilateral arm pain, pt is in afib and awaiting cardioversion on 11/7, pt has fevers that started today, coughing up clear sputum and rhonchi

## 2022-08-09 NOTE — Progress Notes (Signed)
PROGRESS NOTE  Patricia [name]  URK:[id number] DOB: 03-Jan-1930 DOA: 08/24/2022 PCP: Deland Pretty, MD   Brief Narrative:  Patient is a 88 year old female with history of CVA, hypothyroidism, GERD, A-fib on Eliquis who presented with shortness of breath, cough, chest pain, back pain from home.  Having cough and dyspnea for weeks, was taking prednisone and albuterol at home.  On presentation she was hypoxic, saturating in upper 80s on room air with tachypnea, hypotension.  Lab work showed potassium of 3, leukocytosis of 15, lactate of 2.3.  Chest x-ray showed right infrahilar airspace opacity concerning for pneumonia .  Patient was in A-fib on presentation.  Patient admitted for the management of community-acquired pneumonia.   Assessment & Plan:  Principal Problem:   Sepsis due to pneumonia [facility]) Active Problems:   Occipital stroke ([place])   Hypothyroid   Atrial fibrillation with RVR ([place])   Hypokalemia   Acute respiratory failure with hypoxia ([facility])   Sepsis secondary to community-acquired pneumonia: Presented with dyspnea, cough.  Lab work showed leukocytosis, elevated lactate.  Started on broad-spectrum antibiotics.  Blood culture sent.  Acute hypoxic respiratory failure: Secondary to pneumonia.  Continue to wean the oxygen.Not  On oxygen at home.  A-fib with RVR: Heart rate ranging invading 100-1 20 this morning.  Continue Eliquis for anticoagulation, continue metoprolol for rate control.  Monitor on telemetry  History of CVA: Continue Lipitor, Eliquis  Hypothyroidism: Continue Synthyroid  Hypokalemia: Being supplemented  Deconditioning/debility: We will request a PT/OT evaluation        DVT prophylaxis: apixaban (ELIQUIS) tablet 5 mg     Code Status: Full Code  Family Communication: None at bedside  Patient status:Inpatient  Patient is from :Home  Anticipated discharge GB:[facility]  Estimated DC date:1-2 days   Consultants: None  Procedures:None  Antimicrobials:   Anti-infectives (From admission, onward)    Start     Dose/Rate Route Frequency Ordered Stop   08/10/22 0200  cefTRIAXone (ROCEPHIN) 2 g in sodium chloride 0.9 % 100 mL IVPB        2 g 200 mL/hr over 30 Minutes Intravenous Every 24 hours 08/21/2022 0446 08/14/22 0159   08/10/22 0200  azithromycin (ZITHROMAX) 500 mg in sodium chloride 0.9 % 250 mL IVPB        500 mg 250 mL/hr over 60 Minutes Intravenous Every 24 hours 08/11/2022 0446 08/14/22 0159   08/21/2022 0200  cefTRIAXone (ROCEPHIN) 1 g in sodium chloride 0.9 % 100 mL IVPB        1 g 200 mL/hr over 30 Minutes Intravenous  Once 08/27/2022 0155 08/08/2022 0310   09/05/2022 0200  azithromycin (ZITHROMAX) 500 mg in sodium chloride 0.9 % 250 mL IVPB        500 mg 250 mL/hr over 60 Minutes Intravenous  Once 09/03/2022 0155 08/08/2022 0324       Subjective: Patient seen and examined at bedside today in the emergency department.  During my evaluation, she was on 2 L of oxygen.  She was speaking in full sentences, she feels better.  Not in respiratory distress.  Appears weak.  EKG monitor showed heart rate from 100-1 20.  Objective: Vitals:   08/28/2022 0430 08/20/2022 0500 08/07/2022 0627 08/10/2022 0700  BP: 99/66 105/82  115/69  Pulse: (!) 102 (!) 104  89  Resp: 20 (!) 23  (!) 24  Temp:   100.3 F (37.9 C)   TempSrc:   Oral   SpO2: 97% 91%  93%    Intake/Output Summary (  Last 24 hours) at 08/19/2022 0757 Last data filed at 08/23/2022 0324 Gross per 24 hour  Intake 251.54 ml  Output --  Net 251.54 ml   There were no vitals filed for this visit.  Examination:  General exam: Overall comfortable, not in distress, weak HEENT: PERRL Respiratory system:  no wheezes or crackles , lungs overall clear bilaterally Cardiovascular system: Irregularly irregular rhythm  gastrointestinal system: Abdomen is nondistended, soft and nontender. Central nervous system: Alert and oriented Extremities: No edema, no clubbing ,no cyanosis Skin: No rashes, no  ulcers,no icterus     Data Reviewed: I have personally reviewed following labs and imaging studies  CBC: Recent Labs  Lab 08/13/2022 0147 08/24/2022 0226  WBC 15.0*  --   NEUTROABS 13.7*  --   HGB 12.8 13.6  HCT 40.9 40.0  MCV 94.5  --   PLT 229  --    Basic Metabolic Panel: Recent Labs  Lab 08/19/2022 0147 08/24/2022 0226  NA 137 142  K 3.0* 3.1*  CL 103 102  CO2 22  --   GLUCOSE 165* 178*  BUN 18 16  CREATININE 0.75 0.90  CALCIUM 8.9  --      Recent Results (from the past 240 hour(s))  Resp Panel by RT-PCR (Flu A&B, Covid) Anterior Nasal Swab     Status: None   Collection Time: 07/31/22  3:03 PM   Specimen: Anterior Nasal Swab  Result Value Ref Range Status   SARS Coronavirus 2 by RT PCR NEGATIVE NEGATIVE Final    Comment: (NOTE) SARS-CoV-2 target nucleic acids are NOT DETECTED.  The SARS-CoV-2 RNA is generally detectable in upper respiratory specimens during the acute phase of infection. The lowest concentration of SARS-CoV-2 viral copies this assay can detect is 138 copies/mL. A negative result does not preclude SARS-Cov-2 infection and should not be used as the sole basis for treatment or other patient management decisions. A negative result may occur with  improper specimen collection/handling, submission of specimen other than nasopharyngeal swab, presence of viral mutation(s) within the areas targeted by this assay, and inadequate number of viral copies(<138 copies/mL). A negative result must be combined with clinical observations, patient history, and epidemiological information. The expected result is Negative.  Fact Sheet for Patients:  EntrepreneurPulse.com.au  Fact Sheet for Healthcare Providers:  IncredibleEmployment.be  This test is no t yet approved or cleared by the [place] FDA and  has been authorized for detection and/or diagnosis of SARS-CoV-2 by FDA under an Emergency Use Authorization (EUA). This EUA  will remain  in effect (meaning this test can be used) for the duration of the COVID-19 declaration under Section 564(b)(1) of the Act, 21 U.S.C.section 360bbb-3(b)(1), unless the authorization is terminated  or revoked sooner.       Influenza A by PCR NEGATIVE NEGATIVE Final   Influenza B by PCR NEGATIVE NEGATIVE Final    Comment: (NOTE) The Xpert Xpress SARS-CoV-2/FLU/RSV plus assay is intended as an aid in the diagnosis of influenza from Nasopharyngeal swab specimens and should not be used as a sole basis for treatment. Nasal washings and aspirates are unacceptable for Xpert Xpress SARS-CoV-2/FLU/RSV testing.  Fact Sheet for Patients: EntrepreneurPulse.com.au  Fact Sheet for Healthcare Providers: IncredibleEmployment.be  This test is not yet approved or cleared by the [place] FDA and has been authorized for detection and/or diagnosis of SARS-CoV-2 by FDA under an Emergency Use Authorization (EUA). This EUA will remain in effect (meaning this test can be used) for the duration of  the COVID-19 declaration under Section 564(b)(1) of the Act, 21 U.S.C. section 360bbb-3(b)(1), unless the authorization is terminated or revoked.  Performed at [facility], [place] [address] [place], [place] [address]      Radiology Studies: DG Chest 2 View  Result Date: 08/28/2022 CLINICAL DATA:  Suspect sepsis. EXAM: CHEST - 2 VIEW COMPARISON:  07/31/2022 FINDINGS: Increasing opacity in the right infrahilar region concerning for pneumonia. Left lung clear. Heart is borderline in size. No effusions. Aortic atherosclerosis. IMPRESSION: Right infrahilar airspace opacity concerning for pneumonia. Electronically Signed   By: Rolm Baptise M.D.   On: 08/20/2022 02:14    Scheduled Meds:  apixaban  5 mg Oral BID   atorvastatin  10 mg Oral Daily   levothyroxine  88 mcg Oral Q0600   melatonin  5 mg Oral QHS   metoprolol tartrate  12.5 mg Oral  BID   pantoprazole  40 mg Oral BID   potassium chloride  40 mEq Oral Once   sertraline  50 mg Oral Daily   sodium chloride flush  3 mL Intravenous Q12H   Continuous Infusions:  [START ON 08/10/2022] azithromycin     [START ON 08/10/2022] cefTRIAXone (ROCEPHIN)  IV     lactated ringers       LOS: 0 days   Shelly Coss, MD [facility] P11/12/2021, 7:57 AM

## 2022-08-09 NOTE — Sepsis Progress Note (Signed)
Following per sepsis protocol   

## 2022-08-09 NOTE — ED Provider Notes (Signed)
[facility] DEPT Provider Note   CSN: [id number] Arrival date & time: 09/02/2022  0119     History  Chief Complaint  Patient presents with   Chest Pain    Patricia [name] is a 88 y.o. female.  The history is provided by the patient and medical records.  Chest Pain Patricia [name] is a 88 y.o. female who presents to the Emergency Department complaining of chest pain.  She presents to the emergency department accompanied by her daughter for evaluation of chest pain, cough and shortness of breath.  She initially got sick about 3 weeks ago with upper respiratory symptoms was started on prednisone, albuterol at urgent care.  About 1 week ago she was evaluated in the emergency department due to persistent cough and was found to be in atrial fibrillation and is scheduled for cardioversion on Tuesday.  Since she was evaluated she has been compliant with 12.5 mg of metoprolol twice daily as well as Eliquis.  Daughter brings her in today due to worsening chest pain, back pain, pain in bilateral arms that started today.  Pain is described as a pressure sensation.  Also had a temperature to 99.5 today and her cough is worsening.     Home Medications Prior to Admission medications   Medication Sig Start Date End Date Taking? Authorizing Provider  Acetaminophen (TYLENOL) 325 MG CAPS Take 1 tablet by mouth every 6 (six) hours as needed (pain).   Yes [provider]  albuterol (VENTOLIN HFA) 108 (90 Base) MCG/ACT inhaler Inhale 2 puffs into the lungs every 6 (six) hours as needed for wheezing or shortness of breath.   Yes [provider]  apixaban (ELIQUIS) 5 MG TABS tablet Take 5 mg by mouth 2 (two) times daily.   Yes [provider]  ascorbic acid (VITAMIN C) 500 MG tablet Take 500 mg by mouth daily.   Yes [provider]  atorvastatin (LIPITOR) 10 MG tablet Take 10 mg by mouth daily. 09/26/20  Yes [provider]  benzonatate  (TESSALON) 200 MG capsule Take 200 mg by mouth 3 (three) times daily. 07/27/22  Yes [provider]  CALCIUM PO Take 500 mg by mouth 2 (two) times daily.   Yes [provider]  cholecalciferol (VITAMIN D) 1000 units tablet Take 1,000 Units by mouth daily.   Yes [provider]  denosumab (PROLIA) 60 MG/ML SOSY injection Inject 60 mg into the skin every 6 (six) months.   Yes [provider]  furosemide (LASIX) 20 MG tablet Take 1 tablet (20 mg total) by mouth daily. 07/12/22  Yes Jerline Pain, MD  levothyroxine (SYNTHROID, LEVOTHROID) 88 MCG tablet Take 88 mcg by mouth daily before breakfast.   Yes [provider]  MELATONIN PO Take 10 mg by mouth at bedtime.   Yes [provider]  metoprolol tartrate (LOPRESSOR) 25 MG tablet Take 0.5 tablets (12.5 mg total) by mouth 2 (two) times daily. 07/31/22 08/30/22 Yes Curatolo, Adam, DO  pantoprazole (PROTONIX) 40 MG tablet Take 40 mg by mouth 2 (two) times daily. 12/06/20  Yes [provider]  polyethylene glycol (MIRALAX) 17 g packet Take 17 g by mouth as needed for mild constipation or moderate constipation.   Yes [provider]  sertraline (ZOLOFT) 50 MG tablet Take 50 mg by mouth daily.    Yes [provider]  vitamin B-12 (CYANOCOBALAMIN) 1000 MCG tablet Take 1,000 mcg by mouth daily.   Yes [provider]  methylPREDNISolone (MEDROL DOSEPAK) 4 MG TBPK tablet Take by mouth as directed. Patient not taking: Reported on 07/31/2022 07/27/22   [provider]      Allergies    Ciprofloxacin and Fosamax [alendronate sodium]    Review of Systems   Review of Systems  Cardiovascular:  Positive for chest pain.  All other systems reviewed and are negative.   Physical Exam Updated Vital Signs BP 105/82   Pulse (!) 104   Temp 99.5 F (37.5 C)   Resp (!) 23   SpO2 91%  Physical Exam Vitals and nursing note reviewed.  Constitutional:      General: She  is in acute distress.     Appearance: She is well-developed.  HENT:     Head: Normocephalic and atraumatic.  Cardiovascular:     Rate and Rhythm: Tachycardia present. Rhythm irregular.     Heart sounds: No murmur heard. Pulmonary:     Effort: Pulmonary effort is normal. No respiratory distress.     Comments: Frequent coughing.  Crackles in bilateral bases Abdominal:     Palpations: Abdomen is soft.     Tenderness: There is no abdominal tenderness. There is no guarding or rebound.  Musculoskeletal:        General: No swelling or tenderness.  Skin:    General: Skin is warm and dry.  Neurological:     Mental Status: She is alert and oriented to person, place, and time.  Psychiatric:        Behavior: Behavior normal.     ED Results / Procedures / Treatments   Labs (all labs ordered are listed, but only abnormal results are displayed) Labs Reviewed  COMPREHENSIVE METABOLIC PANEL - Abnormal; Notable for the following components:      Result Value   Potassium 3.0 (*)    Glucose, Bld 165 (*)    All other components within normal limits  LACTIC ACID, PLASMA - Abnormal; Notable for the following components:   Lactic Acid, Venous 2.3 (*)    All other components within normal limits  CBC WITH DIFFERENTIAL/PLATELET - Abnormal; Notable for the following components:   WBC 15.0 (*)    Neutro Abs 13.7 (*)    Lymphs Abs 0.6 (*)    Abs Immature Granulocytes 0.08 (*)    All other components within normal limits  PROTIME-INR - Abnormal; Notable for the following components:   Prothrombin Time 23.2 (*)    INR 2.1 (*)    All other components within normal limits  URINALYSIS, ROUTINE W REFLEX MICROSCOPIC - Abnormal; Notable for the following components:   Leukocytes,Ua TRACE (*)    All other components within normal limits  I-STAT CHEM 8, ED - Abnormal; Notable for the following components:   Potassium 3.1 (*)    Glucose, Bld 178 (*)    All other components within normal limits  CULTURE,  BLOOD (ROUTINE X 2)  CULTURE, BLOOD (ROUTINE X 2)  EXPECTORATED SPUTUM ASSESSMENT W GRAM STAIN, RFLX TO RESP C  LACTIC ACID, PLASMA  BRAIN NATRIURETIC PEPTIDE  PROCALCITONIN  LEGIONELLA PNEUMOPHILA SEROGP 1 UR AG  STREP PNEUMONIAE URINARY ANTIGEN  PROCALCITONIN  TROPONIN I (HIGH SENSITIVITY)  TROPONIN I (HIGH SENSITIVITY)    EKG EKG Interpretation  Date/Time:  Friday August 09 2022 01:31:54 EDT Ventricular Rate:  138 PR Interval:    QRS Duration: 107 QT Interval:  332 QTC Calculation: 481 R Axis:   189 Text Interpretation: Atrial fibrillation Paired ventricular premature complexes Right axis deviation Low voltage,  extremity leads ST depression, probably rate related Minimal ST elevation, lateral leads Confirmed by Quintella Reichert [phone]) on 08/25/2022 1:55:49 AM  Radiology DG Chest 2 View  Result Date: 08/15/2022 CLINICAL DATA:  Suspect sepsis. EXAM: CHEST - 2 VIEW COMPARISON:  07/31/2022 FINDINGS: Increasing opacity in the right infrahilar region concerning for pneumonia. Left lung clear. Heart is borderline in size. No effusions. Aortic atherosclerosis. IMPRESSION: Right infrahilar airspace opacity concerning for pneumonia. Electronically Signed   By: Rolm Baptise M.D.   On: 08/11/2022 02:14    Procedures Procedures   CRITICAL CARE Performed by: Quintella Reichert   Total critical care time: 40 minutes  Critical care time was exclusive of separately billable procedures and treating other patients.  Critical care was necessary to treat or prevent imminent or life-threatening deterioration.  Critical care was time spent personally by me on the following activities: development of treatment plan with patient and/or surrogate as well as nursing, discussions with consultants, evaluation of patient's response to treatment, examination of patient, obtaining history from patient or surrogate, ordering and performing treatments and interventions, ordering and review of laboratory  studies, ordering and review of radiographic studies, pulse oximetry and re-evaluation of patient's condition.  Medications Ordered in ED Medications  atorvastatin (LIPITOR) tablet 10 mg (has no administration in time range)  metoprolol tartrate (LOPRESSOR) tablet 12.5 mg (has no administration in time range)  sertraline (ZOLOFT) tablet 50 mg (has no administration in time range)  levothyroxine (SYNTHROID) tablet 88 mcg (88 mcg Oral Given 08/08/2022 0512)  pantoprazole (PROTONIX) EC tablet 40 mg (has no administration in time range)  apixaban (ELIQUIS) tablet 5 mg (has no administration in time range)  melatonin tablet 5 mg (has no administration in time range)  cefTRIAXone (ROCEPHIN) 2 g in sodium chloride 0.9 % 100 mL IVPB (has no administration in time range)  azithromycin (ZITHROMAX) 500 mg in sodium chloride 0.9 % 250 mL IVPB (has no administration in time range)  sodium chloride flush (NS) 0.9 % injection 3 mL (has no administration in time range)  acetaminophen (TYLENOL) tablet 650 mg (has no administration in time range)    Or  acetaminophen (TYLENOL) suppository 650 mg (has no administration in time range)  polyethylene glycol (MIRALAX / GLYCOLAX) packet 17 g (has no administration in time range)  lactated ringers bolus 1,000 mL (1,000 mLs Intravenous Not Given 08/08/2022 0514)  potassium chloride SA (KLOR-CON M) CR tablet 40 mEq (0 mEq Oral Hold 08/08/2022 0514)  potassium chloride 10 mEq in 100 mL IVPB (10 mEq Intravenous New Bag/Given 09/03/2022 0512)  magnesium sulfate IVPB 1 g 100 mL (has no administration in time range)  albuterol (PROVENTIL) (2.5 MG/3ML) 0.083% nebulizer solution 2.5 mg (has no administration in time range)  cefTRIAXone (ROCEPHIN) 1 g in sodium chloride 0.9 % 100 mL IVPB (0 g Intravenous Stopped 08/12/2022 0310)  azithromycin (ZITHROMAX) 500 mg in sodium chloride 0.9 % 250 mL IVPB (0 mg Intravenous Stopped 08/31/2022 0324)  sodium chloride 0.9 % bolus 500 mL (0 mLs Intravenous  Stopped 08/24/2022 0423)  potassium chloride SA (KLOR-CON M) CR tablet 40 mEq (40 mEq Oral Given 09/03/2022 0315)    ED Course/ Medical Decision Making/ A&P                           Medical Decision Making Amount and/or Complexity of Data Reviewed Labs: ordered. Radiology: ordered.  Risk Prescription drug management. Decision regarding hospitalization.   Patient with history of  atrial fibrillation on anticoagulation here for evaluation of cough, chest pain that has been progressive over the last 3 weeks but significantly worsened today.  She is in A-fib with RVR at time of ED presentation, heart rates quickly improved to around 100 at rest.  She does have a new 2 L oxygen requirement.  Examination and chest x-ray are consistent with pneumonia, images personally reviewed and interpreted.  She was started on antibiotics for community-acquired pneumonia.  She was treated with gentle IV fluid hydration due to concern for possible coexistent CHF.  BC with leukocytosis.  CMP with hypokalemia-replaced orally.  Discussed with patient and daughter findings of studies and recommendation for admission and they are in agreement with plan.  Medicine consulted for admission. `       Final Clinical Impression(s) / ED Diagnoses Final diagnoses:  Community acquired pneumonia of right lower lobe of lung  Hypokalemia    Rx / DC Orders ED Discharge Orders     None         Quintella Reichert, MD 08/14/2022 0522

## 2022-08-09 NOTE — Progress Notes (Signed)
PHARMACIST - PHYSICIAN COMMUNICATION   CONCERNING: IV to Oral Route Change Policy  RECOMMENDATION: This patient is receiving azithromycin by the intravenous route.  Based on criteria approved by the Pharmacy and Therapeutics Committee, the intravenous medication(s) is/are being converted to the equivalent oral dose form(s).   DESCRIPTION: These criteria include: The patient is eating (either orally or via tube) and/or has been taking other orally administered medications for a least 24 hours The patient has no evidence of active gastrointestinal bleeding or impaired GI absorption (gastrectomy, short bowel, patient on TNA or NPO).  If you have questions about this conversion, please contact the Pharmacy Department  '[]'$   [phone] )  [facility] '[]'$   [phone] )  [facility] '[]'$   [phone] )  [facility] '[]'$   [phone] )  [facility] '[x]'$   [phone] )  [facility]

## 2022-08-09 NOTE — H&P (Signed)
History and Physical    Patricia [name] GMW:[id number] DOB: 12-Apr-1930 DOA: 09/05/2022  PCP: Deland Pretty, MD   Patient coming from: Home   Chief Complaint: SOB, productive cough, chest and back pain   HPI: Patricia [name] is a pleasant 88 y.o. female with medical history significant for history of CVA, hypothyroidism, GERD, and atrial fibrillation on Eliquis who presents to the emergency department with shortness of breath, productive cough, and pain involving her chest and back.  Patient has had cough and dyspnea for weeks despite treatment with prednisone and albuterol, and then began to worsen over the past couple days.  She was experiencing subjective fever for the past couple days.  She woke due to shortness of breath overnight and was also experiencing discomfort in her chest and back when she takes a deep breath or coughs.  She has not missed any doses of Eliquis recently.  She had some bilateral lower extremity swelling recently but this resolved with Lasix.  ED Course: Upon arrival to the ED, patient is found to be afebrile and saturating upper 80s on room air with tachypnea and systolic blood pressure of 88 and greater.  EKG features atrial fibrillation with rate 138.  Chest x-ray concerning for blood work notable for potassium 3.0, WBC 15,000, lactic acid 2.3, and INR 2.1.  Blood cultures were collected in the ED and the patient was treated with 500 mL of saline, Rocephin, and azithromycin.  Review of Systems:  All other systems reviewed and apart from HPI, are negative.  Past Medical History:  Diagnosis Date   Bladder incontinence    Depression    GERD (gastroesophageal reflux disease)    High cholesterol    Hypothyroid    Recurrent falls    Stroke [facility])     Past Surgical History:  Procedure Laterality Date   CARPAL TUNNEL RELEASE     CATARACT EXTRACTION Bilateral    EYE SURGERY Left 02/15/15    Laser surgery     Social History:   reports that she has never smoked. She  has never been exposed to tobacco smoke. She has never used smokeless tobacco. She reports current alcohol use. She reports that she does not use drugs.  Allergies  Allergen Reactions   Ciprofloxacin     Pt does not know reaction    Fosamax [Alendronate Sodium] Other (See Comments)    Upset stomach    Family History  Problem Relation Age of Onset   Heart disease Mother    Throat cancer Maternal Aunt    Cancer Maternal Aunt        breast ca   Lupus Daughter    Anuerysm Daughter        Brain     Prior to Admission medications   Medication Sig Start Date End Date Taking? Authorizing Provider  Acetaminophen (TYLENOL) 325 MG CAPS Take 1 tablet by mouth every 6 (six) hours as needed (pain).   Yes [provider]  albuterol (VENTOLIN HFA) 108 (90 Base) MCG/ACT inhaler Inhale 2 puffs into the lungs every 6 (six) hours as needed for wheezing or shortness of breath.   Yes [provider]  apixaban (ELIQUIS) 5 MG TABS tablet Take 5 mg by mouth 2 (two) times daily.   Yes [provider]  ascorbic acid (VITAMIN C) 500 MG tablet Take 500 mg by mouth daily.   Yes [provider]  atorvastatin (LIPITOR) 10 MG tablet Take 10 mg by mouth daily. 09/26/20  Yes  [provider]  benzonatate (TESSALON) 200 MG capsule Take 200 mg by mouth 3 (three) times daily. 07/27/22  Yes [provider]  CALCIUM PO Take 500 mg by mouth 2 (two) times daily.   Yes [provider]  cholecalciferol (VITAMIN D) 1000 units tablet Take 1,000 Units by mouth daily.   Yes [provider]  denosumab (PROLIA) 60 MG/ML SOSY injection Inject 60 mg into the skin every 6 (six) months.   Yes [provider]  furosemide (LASIX) 20 MG tablet Take 1 tablet (20 mg total) by mouth daily. 07/12/22  Yes Jerline Pain, MD  levothyroxine (SYNTHROID, LEVOTHROID) 88 MCG tablet Take 88 mcg by mouth daily before breakfast.   Yes [provider]  MELATONIN PO  Take 10 mg by mouth at bedtime.   Yes [provider]  metoprolol tartrate (LOPRESSOR) 25 MG tablet Take 0.5 tablets (12.5 mg total) by mouth 2 (two) times daily. 07/31/22 08/30/22 Yes Curatolo, Adam, DO  pantoprazole (PROTONIX) 40 MG tablet Take 40 mg by mouth 2 (two) times daily. 12/06/20  Yes [provider]  polyethylene glycol (MIRALAX) 17 g packet Take 17 g by mouth as needed for mild constipation or moderate constipation.   Yes [provider]  sertraline (ZOLOFT) 50 MG tablet Take 50 mg by mouth daily.    Yes [provider]  vitamin B-12 (CYANOCOBALAMIN) 1000 MCG tablet Take 1,000 mcg by mouth daily.   Yes [provider]  methylPREDNISolone (MEDROL DOSEPAK) 4 MG TBPK tablet Take by mouth as directed. Patient not taking: Reported on 07/31/2022 07/27/22   [provider]    Physical Exam: Vitals:   08/11/2022 0230 08/16/2022 0300 08/15/2022 0330 08/16/2022 0400  BP: (!) 89/64 (!) 86/62 104/78 114/77  Pulse: 98 75 (!) 57 (!) 111  Resp:  (!) 21 (!) 25 (!) 34  Temp:      SpO2: (!) 89% 92% 95% 99%    Constitutional: NAD, calm  Eyes: PERTLA, lids and conjunctivae normal ENMT: Mucous membranes are moist. Posterior pharynx clear of any exudate or lesions.   Neck: supple, no masses  Respiratory: rhonchi, no wheezing. No accessory muscle use.  Cardiovascular: Rate ~100 and irregularly irregular. No JVD. Abdomen: No distension, no tenderness, soft. Bowel sounds active.  Musculoskeletal: no clubbing / cyanosis. No joint deformity upper and lower extremities.   Skin: no significant rashes, lesions, ulcers. Warm, dry, well-perfused. Neurologic: CN 2-12 grossly intact. Moving all extremities. Alert and oriented.  Psychiatric: Pleasant. Cooperative.    Labs and Imaging on Admission: I have personally reviewed following labs and imaging studies  CBC: Recent Labs  Lab 09/02/2022 0147 08/12/2022 0226  WBC 15.0*  --   NEUTROABS 13.7*  --   HGB  12.8 13.6  HCT 40.9 40.0  MCV 94.5  --   PLT 229  --    Basic Metabolic Panel: Recent Labs  Lab 08/26/2022 0147 08/15/2022 0226  NA 137 142  K 3.0* 3.1*  CL 103 102  CO2 22  --   GLUCOSE 165* 178*  BUN 18 16  CREATININE 0.75 0.90  CALCIUM 8.9  --    GFR: Estimated Creatinine Clearance: 40.4 mL/min (by C-G formula based on SCr of 0.9 mg/dL). Liver Function Tests: Recent Labs  Lab 08/11/2022 0147  AST 26  ALT 20  ALKPHOS 61  BILITOT 0.8  PROT 7.9  ALBUMIN 3.7   No results for input(s): "LIPASE", "AMYLASE" in the last 168 hours. No results for  input(s): "AMMONIA" in the last 168 hours. Coagulation Profile: Recent Labs  Lab 08/16/2022 0147  INR 2.1*   Cardiac Enzymes: No results for input(s): "CKTOTAL", "CKMB", "CKMBINDEX", "TROPONINI" in the last 168 hours. BNP (last 3 results) No results for input(s): "PROBNP" in the last 8760 hours. HbA1C: No results for input(s): "HGBA1C" in the last 72 hours. CBG: No results for input(s): "GLUCAP" in the last 168 hours. Lipid Profile: No results for input(s): "CHOL", "HDL", "LDLCALC", "TRIG", "CHOLHDL", "LDLDIRECT" in the last 72 hours. Thyroid Function Tests: No results for input(s): "TSH", "T4TOTAL", "FREET4", "T3FREE", "THYROIDAB" in the last 72 hours. Anemia Panel: No results for input(s): "VITAMINB12", "FOLATE", "FERRITIN", "TIBC", "IRON", "RETICCTPCT" in the last 72 hours. Urine analysis:    Component Value Date/Time   COLORURINE YELLOW 06/29/2022 1330   APPEARANCEUR HAZY (A) 06/29/2022 1330   LABSPEC 1.025 06/29/2022 1330   PHURINE 5.5 06/29/2022 1330   GLUCOSEU NEGATIVE 06/29/2022 1330   HGBUR NEGATIVE 06/29/2022 1330   BILIRUBINUR NEGATIVE 06/29/2022 1330   KETONESUR NEGATIVE 06/29/2022 1330   PROTEINUR 30 (A) 06/29/2022 1330   UROBILINOGEN 1.0 03/19/2009 1456   NITRITE NEGATIVE 06/29/2022 1330   LEUKOCYTESUR NEGATIVE 06/29/2022 1330   Sepsis Labs: '@LABRCNTIP'$ (procalcitonin:4,lacticidven:4) ) Recent Results  (from the past 240 hour(s))  Resp Panel by RT-PCR (Flu A&B, Covid) Anterior Nasal Swab     Status: None   Collection Time: 07/31/22  3:03 PM   Specimen: Anterior Nasal Swab  Result Value Ref Range Status   SARS Coronavirus 2 by RT PCR NEGATIVE NEGATIVE Final    Comment: (NOTE) SARS-CoV-2 target nucleic acids are NOT DETECTED.  The SARS-CoV-2 RNA is generally detectable in upper respiratory specimens during the acute phase of infection. The lowest concentration of SARS-CoV-2 viral copies this assay can detect is 138 copies/mL. A negative result does not preclude SARS-Cov-2 infection and should not be used as the sole basis for treatment or other patient management decisions. A negative result may occur with  improper specimen collection/handling, submission of specimen other than nasopharyngeal swab, presence of viral mutation(s) within the areas targeted by this assay, and inadequate number of viral copies(<138 copies/mL). A negative result must be combined with clinical observations, patient history, and epidemiological information. The expected result is Negative.  Fact Sheet for Patients:  EntrepreneurPulse.com.au  Fact Sheet for Healthcare Providers:  IncredibleEmployment.be  This test is no t yet approved or cleared by the [place] FDA and  has been authorized for detection and/or diagnosis of SARS-CoV-2 by FDA under an Emergency Use Authorization (EUA). This EUA will remain  in effect (meaning this test can be used) for the duration of the COVID-19 declaration under Section 564(b)(1) of the Act, 21 U.S.C.section 360bbb-3(b)(1), unless the authorization is terminated  or revoked sooner.       Influenza A by PCR NEGATIVE NEGATIVE Final   Influenza B by PCR NEGATIVE NEGATIVE Final    Comment: (NOTE) The Xpert Xpress SARS-CoV-2/FLU/RSV plus assay is intended as an aid in the diagnosis of influenza from Nasopharyngeal swab specimens  and should not be used as a sole basis for treatment. Nasal washings and aspirates are unacceptable for Xpert Xpress SARS-CoV-2/FLU/RSV testing.  Fact Sheet for Patients: EntrepreneurPulse.com.au  Fact Sheet for Healthcare Providers: IncredibleEmployment.be  This test is not yet approved or cleared by the [place] FDA and has been authorized for detection and/or diagnosis of SARS-CoV-2 by FDA under an Emergency Use Authorization (EUA). This EUA will remain in effect (meaning this test can be  used) for the duration of the COVID-19 declaration under Section 564(b)(1) of the Act, 21 U.S.C. section 360bbb-3(b)(1), unless the authorization is terminated or revoked.  Performed at [facility], [place] [address], [place], [place] [address]      Radiological Exams on Admission: DG Chest 2 View  Result Date: 09/05/2022 CLINICAL DATA:  Suspect sepsis. EXAM: CHEST - 2 VIEW COMPARISON:  07/31/2022 FINDINGS: Increasing opacity in the right infrahilar region concerning for pneumonia. Left lung clear. Heart is borderline in size. No effusions. Aortic atherosclerosis. IMPRESSION: Right infrahilar airspace opacity concerning for pneumonia. Electronically Signed   By: Rolm Baptise M.D.   On: 08/13/2022 02:14    EKG: Independently reviewed. Atrial fibrillation, rate 138, PVCs, RAD.   Assessment/Plan   1. Sepsis d/t pneumonia; acute hypoxic respiratory failure  - Blood cultures collected in ED and empiric abx started  - Culture sputum, check strep pneumo and legionella antigens, continue Rocephin and azithromycin, continue supplemental O2 as needed, trend procalcitonin and lactate, follow cultures and clinical response to treatment    2. Atrial fibrillation with RVR  - Rate improved with IVF in ED  - Continue Eliquis, continue metoprolol as BP allows    3. Hx of CVA  - Continue Lipitor and Eliquis    4. Hypothyroidism  - Continue  Synthroid    5. Hypokalemia  - Replacing    DVT prophylaxis: Eliquis  Code Status: Full  Level of Care: Level of care: Stepdown Family Communication: Daughter at bedside   Disposition Plan:  Patient is from: home  Anticipated d/c is to: TBD Anticipated d/c date is: 08/12/22  Patient currently: Pending improved respiratory status and stable BP  Consults called: none  Admission status: Inpatient     Vianne Bulls, MD [facility]  08/25/2022, 4:49 AM

## 2022-08-10 ENCOUNTER — Inpatient Hospital Stay (HOSPITAL_COMMUNITY): Payer: Medicare PPO

## 2022-08-10 DIAGNOSIS — A419 Sepsis, unspecified organism: Secondary | ICD-10-CM | POA: Diagnosis not present

## 2022-08-10 DIAGNOSIS — I639 Cerebral infarction, unspecified: Secondary | ICD-10-CM | POA: Diagnosis not present

## 2022-08-10 DIAGNOSIS — I4891 Unspecified atrial fibrillation: Secondary | ICD-10-CM | POA: Diagnosis not present

## 2022-08-10 DIAGNOSIS — J189 Pneumonia, unspecified organism: Secondary | ICD-10-CM | POA: Diagnosis not present

## 2022-08-10 DIAGNOSIS — J9601 Acute respiratory failure with hypoxia: Secondary | ICD-10-CM | POA: Diagnosis not present

## 2022-08-10 LAB — BASIC METABOLIC PANEL
Anion gap: 10 (ref 5–15)
BUN: 18 mg/dL (ref 8–23)
CO2: 23 mmol/L (ref 22–32)
Calcium: 8.8 mg/dL — ABNORMAL LOW (ref 8.9–10.3)
Chloride: 102 mmol/L (ref 98–111)
Creatinine, Ser: 1.08 mg/dL — ABNORMAL HIGH (ref 0.44–1.00)
GFR, Estimated: 49 mL/min — ABNORMAL LOW (ref >60)
Glucose, Bld: 147 mg/dL — ABNORMAL HIGH (ref 70–99)
Potassium: 4.2 mmol/L (ref 3.5–5.1)
Sodium: 135 mmol/L (ref 135–145)

## 2022-08-10 LAB — BLOOD GAS, ARTERIAL
Acid-base deficit: 3.7 mmol/L — ABNORMAL HIGH (ref 0.0–2.0)
Bicarbonate: 22.1 mmol/L (ref 20.0–28.0)
O2 Saturation: 100 %
Patient temperature: 36.5
pCO2 arterial: 41 mmHg (ref 32–48)
pH, Arterial: 7.34 — ABNORMAL LOW (ref 7.35–7.45)
pO2, Arterial: 99 mmHg (ref 83–108)

## 2022-08-10 LAB — CBC
HCT: 39.1 % (ref 36.0–46.0)
Hemoglobin: 11.9 g/dL — ABNORMAL LOW (ref 12.0–15.0)
MCH: 29.5 pg (ref 26.0–34.0)
MCHC: 30.4 g/dL (ref 30.0–36.0)
MCV: 96.8 fL (ref 80.0–100.0)
Platelets: 175 10*3/uL (ref 150–400)
RBC: 4.04 MIL/uL (ref 3.87–5.11)
RDW: 14.8 % (ref 11.5–15.5)
WBC: 10.5 10*3/uL (ref 4.0–10.5)
nRBC: 0 % (ref 0.0–0.2)

## 2022-08-10 LAB — MRSA NEXT GEN BY PCR, NASAL: MRSA by PCR Next Gen: NOT DETECTED

## 2022-08-10 LAB — MAGNESIUM: Magnesium: 2.2 mg/dL (ref 1.7–2.4)

## 2022-08-10 LAB — PROCALCITONIN: Procalcitonin: <0.1 ng/mL

## 2022-08-10 MED ORDER — AMIODARONE HCL IN DEXTROSE 360-4.14 MG/200ML-% IV SOLN
30.0000 mg/h | INTRAVENOUS | Status: DC
  Filled 2022-08-10: qty 200

## 2022-08-10 MED ORDER — AMIODARONE HCL IN DEXTROSE 360-4.14 MG/200ML-% IV SOLN
30.0000 mg/h | INTRAVENOUS | Status: DC
  Administered 2022-08-11 – 2022-08-12 (×4): 30 mg/h via INTRAVENOUS
  Filled 2022-08-10 (×4): qty 200

## 2022-08-10 MED ORDER — AMIODARONE LOAD VIA INFUSION
150.0000 mg | Freq: Once | INTRAVENOUS | Status: AC
  Administered 2022-08-10: 150 mg via INTRAVENOUS
  Filled 2022-08-10: qty 83.34

## 2022-08-10 MED ORDER — METOPROLOL TARTRATE 5 MG/5ML IV SOLN
2.5000 mg | Freq: Once | INTRAVENOUS | Status: AC
  Administered 2022-08-10: 2.5 mg via INTRAVENOUS
  Filled 2022-08-10: qty 5

## 2022-08-10 MED ORDER — AMIODARONE HCL IN DEXTROSE 360-4.14 MG/200ML-% IV SOLN
60.0000 mg/h | INTRAVENOUS | Status: DC
  Administered 2022-08-10: 60 mg/h via INTRAVENOUS
  Filled 2022-08-10: qty 200

## 2022-08-10 MED ORDER — GUAIFENESIN ER 600 MG PO TB12
1200.0000 mg | ORAL_TABLET | Freq: Two times a day (BID) | ORAL | Status: DC
  Administered 2022-08-10 – 2022-08-11 (×4): 1200 mg via ORAL
  Filled 2022-08-10 (×4): qty 2

## 2022-08-10 MED ORDER — SODIUM CHLORIDE 0.9 % IV SOLN: INTRAVENOUS | Status: DC

## 2022-08-10 MED ORDER — CHLORHEXIDINE GLUCONATE CLOTH 2 % EX PADS
6.0000 | MEDICATED_PAD | Freq: Every day | CUTANEOUS | Status: DC
  Administered 2022-08-10 – 2022-08-18 (×9): 6 via TOPICAL

## 2022-08-10 MED ORDER — AMIODARONE HCL IN DEXTROSE 360-4.14 MG/200ML-% IV SOLN
60.0000 mg/h | INTRAVENOUS | Status: AC
  Administered 2022-08-10 – 2022-08-11 (×4): 60 mg/h via INTRAVENOUS
  Filled 2022-08-10 (×3): qty 200

## 2022-08-10 MED ORDER — SODIUM CHLORIDE 0.9 % IV BOLUS
1000.0000 mL | Freq: Once | INTRAVENOUS | Status: AC
  Administered 2022-08-10: 1000 mL via INTRAVENOUS

## 2022-08-10 NOTE — Progress Notes (Signed)
WOB improved.  Still needs increased O2.  Could try on heated high flow oxygen in steady of Bipap if needed.  Will restart diet.  Updated family at bedside.  Chesley Mires, MD [place] Pager - [phone] 08/10/2022, 2:13 PM

## 2022-08-10 NOTE — Progress Notes (Signed)
Rounding Note    Patient Name: Patricia [name] Date of Encounter: 08/11/2022  [facility] Cardiologist: None   Subjective   Feeling better today. Breathing improving. No chest pain or palpitations.  HR better controlled running mainly 90-110s Blood pressure improved to 110-130/70s Remains on HFNC satting in 90s  Inpatient Medications    Scheduled Meds:  apixaban  5 mg Oral BID   atorvastatin  10 mg Oral Daily   azithromycin  500 mg Oral Daily   Chlorhexidine Gluconate Cloth  6 each Topical Daily   guaiFENesin  1,200 mg Oral BID   levothyroxine  88 mcg Oral Q0600   melatonin  5 mg Oral QHS   pantoprazole  40 mg Oral BID   potassium chloride  40 mEq Oral Once   sertraline  50 mg Oral Daily   sodium chloride flush  3 mL Intravenous Q12H   Continuous Infusions:  sodium chloride 100 mL/hr at 08/11/22 0630   amiodarone 60 mg/hr (08/11/22 0630)   Followed by   amiodarone     cefTRIAXone (ROCEPHIN)  IV Stopped (08/11/22 0151)   lactated ringers     PRN Meds: acetaminophen **OR** acetaminophen, albuterol, ondansetron (ZOFRAN) IV, mouth rinse, polyethylene glycol   Vital Signs    Vitals:   08/11/22 0300 08/11/22 0400 08/11/22 0520 08/11/22 0600  BP:  114/88  (!) 130/98  Pulse: (!) 102 84 96 99  Resp: 19 (!) '21 20 20  '$ Temp:  99.5 F (37.5 C)    TempSrc:  Oral    SpO2: (!) 88% 96% 96% 93%  Weight:      Height:        Intake/Output Summary (Last 24 hours) at 08/11/2022 0733 Last data filed at 08/11/2022 0630 Gross per 24 hour  Intake 4040.39 ml  Output 200 ml  Net 3840.39 ml      08/17/2022    1:59 PM 07/31/2022    2:16 PM 07/12/2022    4:02 PM  Last 3 Weights  Weight (lbs) 157 lb 13.6 oz 156 lb 157 lb  Weight (kg) 71.6 kg 70.761 kg 71.215 kg      Telemetry    Afib with HR 90-120 - Personally Reviewed  ECG    No new tracing today - Personally Reviewed  Physical Exam   GEN: Elderly female, more comfortable, on HFNC Neck: No JVD Cardiac:  Tachycardic, irregular, no murmurs Respiratory: Scattered rhonchi GI: Soft, nontender, non-distended  MS: No edema; No deformity. Neuro:  Nonfocal  Psych: Normal affect   Labs    High Sensitivity Troponin:   Recent Labs  Lab 08/29/2022 0147 08/21/2022 0408  TROPONINIHS 14 15     Chemistry Recent Labs  Lab 08/19/2022 0147 08/29/2022 0226 08/10/22 0615 08/11/22 0251  NA 137 142 135 135  K 3.0* 3.1* 4.2 3.9  CL 103 102 102 103  CO2 22  --  23 21*  GLUCOSE 165* 178* 147* 168*  BUN '18 16 18 '$ 24*  CREATININE 0.75 0.90 1.08* 1.16*  CALCIUM 8.9  --  8.8* 8.5*  MG  --   --  2.2  --   PROT 7.9  --   --   --   ALBUMIN 3.7  --   --   --   AST 26  --   --   --   ALT 20  --   --   --   ALKPHOS 61  --   --   --   BILITOT  0.8  --   --   --   GFRNONAA >60  --  49* 45*  ANIONGAP 12  --  10 11    Lipids No results for input(s): "CHOL", "TRIG", "HDL", "LABVLDL", "LDLCALC", "CHOLHDL" in the last 168 hours.  Hematology Recent Labs  Lab 08/19/2022 0147 08/08/2022 0226 08/10/22 0615 08/11/22 0251  WBC 15.0*  --  10.5 10.9*  RBC 4.33  --  4.04 3.99  HGB 12.8 13.6 11.9* 11.9*  HCT 40.9 40.0 39.1 38.2  MCV 94.5  --  96.8 95.7  MCH 29.6  --  29.5 29.8  MCHC 31.3  --  30.4 31.2  RDW 14.8  --  14.8 14.7  PLT 229  --  175 192   Thyroid No results for input(s): "TSH", "FREET4" in the last 168 hours.  BNP Recent Labs  Lab 08/13/2022 0147  BNP 418.6*    DDimer No results for input(s): "DDIMER" in the last 168 hours.   Radiology    DG Chest Port 1 View  Result Date: 08/10/2022 CLINICAL DATA:  88 year old female with history of respiratory distress. EXAM: PORTABLE CHEST 1 VIEW COMPARISON:  Chest x-ray 08/20/2022. FINDINGS: Lung volumes are normal. No consolidative airspace disease. No pleural effusions. No pneumothorax. No evidence of pulmonary edema. Heart size is borderline enlarged. Upper mediastinal contours are within normal limits allowing for patient positioning. Atherosclerotic  calcifications in the thoracic aorta. IMPRESSION: 1. No radiographic evidence of acute cardiopulmonary disease. 2. Aortic atherosclerosis. Electronically Signed   By: Vinnie Langton M.D.   On: 08/10/2022 09:42    Cardiac Studies   TTE 01/2021: IMPRESSIONS     1. Left ventricular ejection fraction, by estimation, is 65 to 70%. The  left ventricle has normal function. The left ventricle has no regional  wall motion abnormalities. Left ventricular diastolic parameters are  consistent with Grade I diastolic  dysfunction (impaired relaxation).   2. Right ventricular systolic function is normal. The right ventricular  size is normal.   3. The mitral valve is abnormal. Trivial mitral valve regurgitation.   4. The aortic valve is tricuspid. Aortic valve regurgitation is not  visualized. Mild aortic valve sclerosis is present, with no evidence of  aortic valve stenosis.   5. The inferior vena cava IVC appears spontaneously collapsed, suggestive  of volume depletion and a low RA pressure <3 mmHg.   Conclusion(s)/Recommendation(s): Consider volume depletion as possible  etiology of near-syncope.   Patient Profile     88 y.o. female  with a hx of prior CVA, hypothyroidism, GERD and known Afib on apixaban who presented with sepsis secondary to pneumonia with course complicated by Afib with RVR for which Cardiology has been consulted.  Assessment & Plan    #Afib with RVR: CHADs-vasc 6. Presented with sepsis from pneumonia with course complicated by Afib with RVR with HR up to 150s. Was initially on metop but became hypotensive and has since been switched to amiodarone. Suspect HR will continue to improve as she clinically improves from her sepsis. Will continue amiodarone gtt today. Given improving blood pressures, will add back metop 12.'5mg'$  BID this AM. -Continue amiodarone gtt today -Start metop 12.'5mg'$  BID given improved BP -Continue apixaban '5mg'$  BID for [facility] -Management of pneumonia per  primary team   #Sepsis secondary to Pneumonia: Patient presented with cough, SOB and chest pain found to have RLL opacity consistent with pneumonia. Lactate elevated on admission with WBC of 15.On broad spectrum ABX. Developed worsening hypoxia on 08/10/22 prompting transfer  to ICU. Now on HFNC. -Management per PPCM   #Acute Hypoxic Respiratory Failure: Secondary to pneumonia as above. On HFNC. Management per PCCM.   #History of CVA: On apixaban and lipitor  Plan discussed with patient and daughters at bedside.      For questions or updates, please contact [place] Please consult [email or web address] for contact info under        Signed, Freada Bergeron, MD  08/11/2022, 7:33 AM

## 2022-08-10 NOTE — Significant Event (Signed)
Rapid Response Event Note   Reason for Call :  A fib RVR w hypotension   Initial Focused Assessment:  Initially called at 0752 for notification of A fib RVR. Per RN patient has history and was receiving 1L fluid to see if this would help HR. HR started to trend upwards and sustained 140s-170s. Adhikari MD notified and orders placed for Amiodarone bolus and drip due to soft BP.  Alert and oriented, awake in bed on arrival. Pulses 2+ and equal bilaterally, moves all extremities on command.  BP soft, 88/76 but is getting bolus. HR 140-170s A-fib on monitor.  On 2 L SpO2 94%, lungs bilaterally rhonchi. Patient states she does have wet cough but has trouble coughing sputum up. Respiratory rate 20. States she does feel slightly more short of breath that she did yesterday.   Recalled at [phone] for increased O2 requirements. On arrival patient in respiratory distress SpO2 77% on 6L [place]. RT at bedside and patient placed on NRB and transferred to 1231. [name] (daughter) aware and taken to waiting room while patient settled in room .     Interventions:  Transfer to SD/ICU for monitoring    Event Summary:   MD Notified: Adhikari MD Call Time: [phone] Arrival Time: 0815 End Time: 0825  Josph Macho, RN

## 2022-08-10 NOTE — Consult Note (Signed)
NAME:  Patricia [name], MRN:  [id number], DOB:  [birth year], LOS: 1 ADMISSION DATE:  08/22/2022, CONSULTATION DATE:  08/10/22 REFERRING MD:  Dr. Tawanna Solo, [facility], CHIEF COMPLAINT:  Respiratory distress   History of Present Illness:  88 yo female presented to [facility] ER with chest pain, dyspnea, back pain, cough with sputum, subjective fever (Tm 100.3 F).  SpO2 in 80's on room air in ER.  CXR showed Rt infrahilar ASD and she was started on ABx for community acquired pneumonia.  She developed increased respiratory distress, hypoxia with increased O2 needs, A fib with RVR and hypotension on 08/10/22.  She was transferred to ICU and PCCM consulted to assist with ICU management.  Pertinent  Medical History  CVA, Hypothyroidism, GERD, A fib on eliquis, Depression, Bladder incontinence, HLD  Significant Hospital Events: Including procedures, antibiotic start and stop dates in addition to other pertinent events   11/03 Admit 11/04 Transfer to ICU  Interim History / Subjective:  She has increased cough and chest congestion.  Objective   Blood pressure (!) 88/76, pulse (!) 132, temperature 97.8 F (36.6 C), temperature source Axillary, resp. rate 20, height '5\' 7"'$  (1.702 m), weight 71.6 kg, SpO2 97 %.       No intake or output data in the 24 hours ending 08/10/22 3976 Filed Weights   08/11/2022 1359  Weight: 71.6 kg    Examination:  General - alert, frequent coughing spells Eyes - pupils reactive ENT - no sinus tenderness, no stridor Cardiac - irregular, tachycardic Chest - crackles Rt > Lt Abdomen - soft, non tender, + bowel sounds Extremities - no cyanosis, clubbing, or edema Skin - no rashes Neuro - normal strength, moves extremities, follows commands Psych - normal mood and behavior  Resolved Hospital Problem list     Assessment & Plan:   Acute hypoxic respiratory failure from community acquired pneumonia. - continue supplemental oxygen to keep SpO2 > 92% - monitor need for Bipap or  intubation - f/u CXR intermittently - adjust bronchial hygiene - day 3 of ABx  A fib with RVR. Hx of HLD. - suspect RVR is related to respiratory distress/hypoxia with increased sympathetic drive - cardiology consulted  Hx of depression, hypothyroidism. - per primary team  D/w Dr. Johney Frame and Dr. Tawanna Solo.  Best Practice (right click and "Reselect all SmartList Selections" daily)   Diet/type: NPO w/ oral meds DVT prophylaxis: DOAC GI prophylaxis: PPI Lines: N/A Foley:  N/A Code Status:  full code Last date of multidisciplinary goals of care discussion [updated pt's family at bedsise]  Labs   CBC: Recent Labs  Lab 08/19/2022 0147 08/08/2022 0226 08/10/22 0615  WBC 15.0*  --  10.5  NEUTROABS 13.7*  --   --   HGB 12.8 13.6 11.9*  HCT 40.9 40.0 39.1  MCV 94.5  --  96.8  PLT 229  --  734    Basic Metabolic Panel: Recent Labs  Lab 08/10/2022 0147 09/03/2022 0226 08/10/22 0615  NA 137 142 135  K 3.0* 3.1* 4.2  CL 103 102 102  CO2 22  --  23  GLUCOSE 165* 178* 147*  BUN '18 16 18  '$ CREATININE 0.75 0.90 1.08*  CALCIUM 8.9  --  8.8*  MG  --   --  2.2   GFR: Estimated Creatinine Clearance: 33.7 mL/min (A) (by C-G formula based on SCr of 1.08 mg/dL (H)). Recent Labs  Lab 08/17/2022 0147 08/11/2022 0444 08/07/2022 0515 08/10/22 0615  PROCALCITON  --   --  <  0.10 <0.10  WBC 15.0*  --   --  10.5  LATICACIDVEN 2.3* 1.5  --   --     Liver Function Tests: Recent Labs  Lab 09/04/2022 0147  AST 26  ALT 20  ALKPHOS 61  BILITOT 0.8  PROT 7.9  ALBUMIN 3.7   No results for input(s): "LIPASE", "AMYLASE" in the last 168 hours. No results for input(s): "AMMONIA" in the last 168 hours.  ABG    Component Value Date/Time   HCO3 28.0 06/29/2022 1408   TCO2 25 08/31/2022 0226   O2SAT 36 06/29/2022 1408     Coagulation Profile: Recent Labs  Lab 08/16/2022 0147  INR 2.1*    Cardiac Enzymes: No results for input(s): "CKTOTAL", "CKMB", "CKMBINDEX", "TROPONINI" in the last  168 hours.  HbA1C: Hgb A1c MFr Bld  Date/Time Value Ref Range Status  01/10/2016 12:46 PM 5.9 (H) 4.8 - 5.6 % Final    Comment:             Pre-diabetes: 5.7 - 6.4          Diabetes: >6.4          Glycemic control for adults with diabetes: <7.0     CBG: No results for input(s): "GLUCAP" in the last 168 hours.  Review of Systems:   Reviewed and negative  Past Medical History:  She,  has a past medical history of Bladder incontinence, Depression, GERD (gastroesophageal reflux disease), High cholesterol, Hypothyroid, Recurrent falls, and Stroke ([place]).   Surgical History:   Past Surgical History:  Procedure Laterality Date   CARPAL TUNNEL RELEASE     CATARACT EXTRACTION Bilateral    EYE SURGERY Left 02/15/15    Laser surgery      Social History:   reports that she has never smoked. She has never been exposed to tobacco smoke. She has never used smokeless tobacco. She reports current alcohol use. She reports that she does not use drugs.   Family History:  Her family history includes Anuerysm in her daughter; Cancer in her maternal aunt; Heart disease in her mother; Lupus in her daughter; Throat cancer in her maternal aunt.   Allergies Allergies  Allergen Reactions   Ciprofloxacin     Pt does not know reaction    Fosamax [Alendronate Sodium] Other (See Comments)    Upset stomach     Home Medications  Prior to Admission medications   Medication Sig Start Date End Date Taking? Authorizing Provider  Acetaminophen (TYLENOL) 325 MG CAPS Take 1 tablet by mouth every 6 (six) hours as needed (pain).   Yes [provider]  albuterol (VENTOLIN HFA) 108 (90 Base) MCG/ACT inhaler Inhale 2 puffs into the lungs every 6 (six) hours as needed for wheezing or shortness of breath.   Yes [provider]  apixaban (ELIQUIS) 5 MG TABS tablet Take 5 mg by mouth 2 (two) times daily.   Yes [provider]  ascorbic acid (VITAMIN C) 500 MG tablet Take 500 mg by mouth  daily.   Yes [provider]  atorvastatin (LIPITOR) 10 MG tablet Take 10 mg by mouth daily. 09/26/20  Yes [provider]  benzonatate (TESSALON) 200 MG capsule Take 200 mg by mouth 3 (three) times daily. 07/27/22  Yes [provider]  CALCIUM PO Take 500 mg by mouth 2 (two) times daily.   Yes [provider]  cholecalciferol (VITAMIN D) 1000 units tablet Take 1,000 Units by mouth daily.   Yes [provider]  denosumab (PROLIA) 60 MG/ML SOSY injection Inject 60 mg into the skin every 6 (six) months.   Yes [provider]  furosemide (LASIX) 20 MG tablet Take 1 tablet (20 mg total) by mouth daily. 07/12/22  Yes Jerline Pain, MD  levothyroxine (SYNTHROID, LEVOTHROID) 88 MCG tablet Take 88 mcg by mouth daily before breakfast.   Yes [provider]  MELATONIN PO Take 10 mg by mouth at bedtime.   Yes [provider]  metoprolol tartrate (LOPRESSOR) 25 MG tablet Take 0.5 tablets (12.5 mg total) by mouth 2 (two) times daily. 07/31/22 08/30/22 Yes Curatolo, Adam, DO  pantoprazole (PROTONIX) 40 MG tablet Take 40 mg by mouth 2 (two) times daily. 12/06/20  Yes [provider]  polyethylene glycol (MIRALAX) 17 g packet Take 17 g by mouth as needed for mild constipation or moderate constipation.   Yes [provider]  sertraline (ZOLOFT) 50 MG tablet Take 50 mg by mouth daily.    Yes [provider]  vitamin B-12 (CYANOCOBALAMIN) 1000 MCG tablet Take 1,000 mcg by mouth daily.   Yes [provider]  methylPREDNISolone (MEDROL DOSEPAK) 4 MG TBPK tablet Take by mouth as directed. Patient not taking: Reported on 07/31/2022 07/27/22   [provider]     Critical care time: 37 minutes  Chesley Mires, MD [place] Pager - [phone] 08/10/2022, 10:01 AM

## 2022-08-10 NOTE — Progress Notes (Signed)
   08/16/2022 1359  Assess: MEWS Score  Temp 99.8 F (37.7 C)  BP 101/75  Pulse Rate (!) 104  Resp (!) 24  Assess: MEWS Score  MEWS Temp 0  MEWS Systolic 0  MEWS Pulse 1  MEWS RR 1  MEWS LOC 0  MEWS Score 2  MEWS Score Color Yellow  Assess: if the MEWS score is Yellow or Red  Were vital signs taken at a resting state? Yes  Focused Assessment Change from prior assessment (see assessment flowsheet)  Does the patient meet 2 or more of the SIRS criteria? Yes  Does the patient have a confirmed or suspected source of infection? Yes  Provider and Rapid Response Notified? Yes  MEWS guidelines implemented *See Row Information* Yes  Take Vital Signs  Increase Vital Sign Frequency  Yellow: Q 2hr X 2 then Q 4hr X 2, if remains yellow, continue Q 4hrs  Escalate  MEWS: Escalate Yellow: discuss with charge nurse/RN and consider discussing with provider and RRT  Notify: Charge Nurse/RN  Name of Charge Nurse/RN Notified Megan, RN  Date Charge Nurse/RN Notified 08/23/2022  Time Charge Nurse/RN Notified 1405  Notify: Provider  Provider Name/Title Dr. Tawanna Solo  Date Provider Notified 09/01/2022  Time Provider Notified 1406  Method of Notification Page  Notification Reason Other (Comment) (yellow mews)  Provider response See new orders  Date of Provider Response 08/07/2022  Time of Provider Response 1415  Assess: SIRS CRITERIA  SIRS Temperature  0  SIRS Pulse 1  SIRS Respirations  1  SIRS WBC 1  SIRS Score Sum  3

## 2022-08-10 NOTE — Progress Notes (Signed)
PROGRESS NOTE  Patricia [name]  SJG:[id number] DOB: 01-03-1930 DOA: 09/05/2022 PCP: Deland Pretty, MD   Brief Narrative:  Patient is a 88 year old female with history of CVA, hypothyroidism, GERD, A-fib on Eliquis who presented with shortness of breath, cough, chest pain, back pain from home.  Having cough and dyspnea for weeks, was taking prednisone and albuterol at home.  On presentation she was hypoxic, saturating in upper 80s on room air with tachypnea, hypotension.  Lab work showed potassium of 3, leukocytosis of 15, lactate of 2.3.  Chest x-ray showed right infrahilar airspace opacity concerning for pneumonia .  Patient was in A-fib on presentation.  Patient admitted for the management of community-acquired pneumonia. Respiratory status decompensated today and she was transferred to ICU   Assessment & Plan:  Principal Problem:   Sepsis due to pneumonia [facility]) Active Problems:   Occipital stroke ([place])   Hypothyroid   Atrial fibrillation with RVR ([place])   Hypokalemia   Acute respiratory failure with hypoxia ([place])   CAP (community acquired pneumonia)   Sepsis secondary to community-acquired pneumonia: Presented with dyspnea, cough.  Lab work showed leukocytosis, elevated lactate.  Started on broad-spectrum antibiotics.  Blood culture sent,NGTD Continue current antibiotics.  Hypotensive this morning. Procalcitonin negative  Acute hypoxic respiratory failure: Secondary to pneumonia.  Acutely decompensated in the morning of 11/4, needing high flow oxygen, patient transferred to ICU   A-fib with RVR: Remains in RVR.  Cardiology consulted.  Started on amiodarone drip.  Metoprolol on hold due to hypotension  History of CVA: Continue Lipitor, Eliquis  Hypothyroidism: Continue Synthyroid  Hypokalemia: Supplemented and corrected  Deconditioning/debility: PT/OT evaluation when appropriate        DVT prophylaxis: apixaban (ELIQUIS) tablet 5 mg     Code Status: Full Code  Family  Communication: Daughter  at bedside  Patient status:Inpatient  Patient is from :Home  Anticipated discharge ML:YYTK  Estimated DC date:Not sure   Consultants: None  Procedures:None  Antimicrobials:  Anti-infectives (From admission, onward)    Start     Dose/Rate Route Frequency Ordered Stop   08/10/22 1000  azithromycin (ZITHROMAX) tablet 500 mg        500 mg Oral Daily 08/28/2022 1215 08/14/22 0959   08/10/22 0200  cefTRIAXone (ROCEPHIN) 2 g in sodium chloride 0.9 % 100 mL IVPB        2 g 200 mL/hr over 30 Minutes Intravenous Every 24 hours 08/25/2022 0446 08/14/22 0159   08/10/22 0200  azithromycin (ZITHROMAX) 500 mg in sodium chloride 0.9 % 250 mL IVPB  Status:  Discontinued        500 mg 250 mL/hr over 60 Minutes Intravenous Every 24 hours 08/25/2022 0446 08/27/2022 1215   09/02/2022 0200  cefTRIAXone (ROCEPHIN) 1 g in sodium chloride 0.9 % 100 mL IVPB        1 g 200 mL/hr over 30 Minutes Intravenous  Once 08/12/2022 0155 08/31/2022 0310   08/30/2022 0200  azithromycin (ZITHROMAX) 500 mg in sodium chloride 0.9 % 250 mL IVPB        500 mg 250 mL/hr over 60 Minutes Intravenous  Once 08/24/2022 0155 08/27/2022 0324       Subjective: Patient seen and examined at bedside this morning.  Rapid response  was called.  She was hypotensive and in A-fib with RVR with respiratory distress needing high flow oxygen.  Daughter at bedside.  Objective: Vitals:   08/10/22 0913 08/10/22 0922 08/10/22 0925 08/10/22 1100  BP: 99/73  106/73 (!) 120/22  Pulse: 91  (!) 105 98  Resp: '17  20 15  '$ Temp:  97.8 F (36.6 C)    TempSrc:  Axillary    SpO2: 94%  98% 100%  Weight:      Height:        Intake/Output Summary (Last 24 hours) at 08/10/2022 1143 Last data filed at 08/10/2022 1042 Gross per 24 hour  Intake 1233.58 ml  Output --  Net 1233.58 ml   Filed Weights   08/14/2022 1359  Weight: 71.6 kg    Examination:  General exam: weak, in respiratory distress HEENT: PERRL Respiratory system:  Diminished air sounds bilaterally,bilateral  crackles Cardiovascular system: A-fib with RVR Gastrointestinal system: Abdomen is nondistended, soft and nontender. Central nervous system: Alert and oriented Extremities: No edema, no clubbing ,no cyanosis Skin: No rashes, no ulcers,no icterus     Data Reviewed: I have personally reviewed following labs and imaging studies  CBC: Recent Labs  Lab 08/19/2022 0147 08/15/2022 0226 08/10/22 0615  WBC 15.0*  --  10.5  NEUTROABS 13.7*  --   --   HGB 12.8 13.6 11.9*  HCT 40.9 40.0 39.1  MCV 94.5  --  96.8  PLT 229  --  557   Basic Metabolic Panel: Recent Labs  Lab 08/08/2022 0147 08/26/2022 0226 08/10/22 0615  NA 137 142 135  K 3.0* 3.1* 4.2  CL 103 102 102  CO2 22  --  23  GLUCOSE 165* 178* 147*  BUN '18 16 18  '$ CREATININE 0.75 0.90 1.08*  CALCIUM 8.9  --  8.8*  MG  --   --  2.2     Recent Results (from the past 240 hour(s))  Resp Panel by RT-PCR (Flu A&B, Covid) Anterior Nasal Swab     Status: None   Collection Time: 07/31/22  3:03 PM   Specimen: Anterior Nasal Swab  Result Value Ref Range Status   SARS Coronavirus 2 by RT PCR NEGATIVE NEGATIVE Final    Comment: (NOTE) SARS-CoV-2 target nucleic acids are NOT DETECTED.  The SARS-CoV-2 RNA is generally detectable in upper respiratory specimens during the acute phase of infection. The lowest concentration of SARS-CoV-2 viral copies this assay can detect is 138 copies/mL. A negative result does not preclude SARS-Cov-2 infection and should not be used as the sole basis for treatment or other patient management decisions. A negative result may occur with  improper specimen collection/handling, submission of specimen other than nasopharyngeal swab, presence of viral mutation(s) within the areas targeted by this assay, and inadequate number of viral copies(<138 copies/mL). A negative result must be combined with clinical observations, patient history, and epidemiological information.  The expected result is Negative.  Fact Sheet for Patients:  EntrepreneurPulse.com.au  Fact Sheet for Healthcare Providers:  IncredibleEmployment.be  This test is no t yet approved or cleared by the [place] FDA and  has been authorized for detection and/or diagnosis of SARS-CoV-2 by FDA under an Emergency Use Authorization (EUA). This EUA will remain  in effect (meaning this test can be used) for the duration of the COVID-19 declaration under Section 564(b)(1) of the Act, 21 U.S.C.section 360bbb-3(b)(1), unless the authorization is terminated  or revoked sooner.       Influenza A by PCR NEGATIVE NEGATIVE Final   Influenza B by PCR NEGATIVE NEGATIVE Final    Comment: (NOTE) The Xpert Xpress SARS-CoV-2/FLU/RSV plus assay is intended as an aid in the diagnosis of influenza from Nasopharyngeal swab specimens and should not be used as a  sole basis for treatment. Nasal washings and aspirates are unacceptable for Xpert Xpress SARS-CoV-2/FLU/RSV testing.  Fact Sheet for Patients: EntrepreneurPulse.com.au  Fact Sheet for Healthcare Providers: IncredibleEmployment.be  This test is not yet approved or cleared by the [place] FDA and has been authorized for detection and/or diagnosis of SARS-CoV-2 by FDA under an Emergency Use Authorization (EUA). This EUA will remain in effect (meaning this test can be used) for the duration of the COVID-19 declaration under Section 564(b)(1) of the Act, 21 U.S.C. section 360bbb-3(b)(1), unless the authorization is terminated or revoked.  Performed at [facility], [place] [address] [place], [place] [address]   Culture, blood (Routine x 2)     Status: None (Preliminary result)   Collection Time: 08/12/2022  1:47 AM   Specimen: BLOOD RIGHT ARM  Result Value Ref Range Status   Specimen Description   Final    BLOOD RIGHT ARM Performed at [place] [address] [place], [place] [address]    Special Requests   Final    BOTTLES DRAWN AEROBIC AND ANAEROBIC Blood Culture adequate volume Performed at [facility] [address], [place], [place] [address]    Culture   Final    NO GROWTH 1 DAY Performed at [facility], [place] [address] [place], [place] [address]    Report Status PENDING  Incomplete  Culture, blood (Routine x 2)     Status: None (Preliminary result)   Collection Time: 08/24/2022  2:21 AM   Specimen: BLOOD  Result Value Ref Range Status   Specimen Description   Final    BLOOD BLOOD RIGHT ARM Performed at [place] [address], [place], [place] [address]    Special Requests   Final    BOTTLES DRAWN AEROBIC AND ANAEROBIC Blood Culture adequate volume Performed at [facility] [address] [place], [place] [address]    Culture   Final    NO GROWTH 1 DAY Performed at [facility], [place] [address], [place], [place] [address]    Report Status PENDING  Incomplete     Radiology Studies: DG Chest Port 1 View  Result Date: 08/10/2022 CLINICAL DATA:  88 year old female with history of respiratory distress. EXAM: PORTABLE CHEST 1 VIEW COMPARISON:  Chest x-ray 08/15/2022. FINDINGS: Lung volumes are normal. No consolidative airspace disease. No pleural effusions. No pneumothorax. No evidence of pulmonary edema. Heart size is borderline enlarged. Upper mediastinal contours are within normal limits allowing for patient positioning. Atherosclerotic calcifications in the thoracic aorta. IMPRESSION: 1. No radiographic evidence of acute cardiopulmonary disease. 2. Aortic atherosclerosis. Electronically Signed   By: Vinnie Langton M.D.   On: 08/10/2022 09:42   DG Chest 2 View  Result Date: 08/31/2022 CLINICAL DATA:  Suspect sepsis. EXAM: CHEST - 2 VIEW COMPARISON:  07/31/2022 FINDINGS: Increasing opacity in the right infrahilar  region concerning for pneumonia. Left lung clear. Heart is borderline in size. No effusions. Aortic atherosclerosis. IMPRESSION: Right infrahilar airspace opacity concerning for pneumonia. Electronically Signed   By: Rolm Baptise M.D.   On: 08/26/2022 02:14    Scheduled Meds:  apixaban  5 mg Oral BID   atorvastatin  10 mg Oral Daily   azithromycin  500 mg Oral Daily   Chlorhexidine Gluconate Cloth  6 each Topical Daily   guaiFENesin  1,200 mg Oral BID   levothyroxine  88 mcg Oral Q0600   melatonin  5 mg Oral QHS  pantoprazole  40 mg Oral BID   potassium chloride  40 mEq Oral Once   sertraline  50 mg Oral Daily   sodium chloride flush  3 mL Intravenous Q12H   Continuous Infusions:  sodium chloride 100 mL/hr at 08/10/22 1042   amiodarone 60 mg/hr (08/10/22 1042)   Followed by   Derrill Memo ON 08/11/2022] amiodarone     cefTRIAXone (ROCEPHIN)  IV Stopped (08/10/22 0234)   lactated ringers       LOS: 1 day   Shelly Coss, MD [facility] P11/01/2022, 11:43 AM

## 2022-08-10 NOTE — Progress Notes (Signed)
PT Cancellation Note  Patient Details Name: Patricia [name] MRN: [id number] DOB: September 14, 1930   Cancelled Treatment:    Reason Eval/Treat Not Completed: Medical issues which prohibited therapy (transfer to ICU. PT will follow up as schedule allows.)   Festus Barren., PT, DPT  Acute Rehabilitation Services  Office [phone]  08/10/2022, 9:59 AM

## 2022-08-10 NOTE — Progress Notes (Signed)
OT Cancellation Note  Patient Details Name: Patricia [name] MRN: [id number] DOB: Feb 03, 1930   Cancelled Treatment:    Reason Eval/Treat Not Completed: Medical issues which prohibited therapy. RN requests hold. Patient BP low and HR high. Patient just started on amiodarone drip for heart rate control and cardiology consulted. Will f/u when patient hemodynamically stable.  Smiley Birr L Traycen Goyer 08/10/2022, 8:52 AM

## 2022-08-10 NOTE — Progress Notes (Signed)
Patient complaining of chest pain, rating 5/10, denies radiation nd pain does not change with inspiration. Patient has had consistent cough. Dr. Tawanna Solo notified. Orders received and carried out for EKG. PRN tylenol given. Vital signs stable.

## 2022-08-10 NOTE — Consult Note (Signed)
Cardiology Consultation   Patient ID: Patricia [name] MRN: [id number]; DOB: May 01, 1930  Admit date: 08/26/2022 Date of Consult: 08/10/2022  PCP:  Deland Pretty, [facility] Cardiologist:  None        Patient Profile:   Patricia [name] is a 88 y.o. female with a hx of prior CVA, hypothyroidism, GERD and known Afib on apixaban who is being seen 08/10/2022 for the evaluation of Afib with RVR at the request of Dr. Tawanna Solo.  History of Present Illness:   Patricia [name] is a 88 year old female with medical history as detailed above who is followed by Dr. Marlou Porch as an outpatient. She was initially seen by Dr. Marlou Porch in 12/2020 for near syncope thought to be vagal in nature. She notably already had a diagnosis of Afib and was on apixaban. Zio showed NSR with no Afib or pauses. TTE with EF 65-70%, no WMA, trivial MR. Had recent ER visit on 06/2022 with weakness and confusion found to be in Afib with RVR for which she was treated with metop with improvement.   Presented on this admission with worsening SOB, cough and chest pain found to have RLL pneumonia on CXR with WBC 15 and lactate 2.3. Procal was <0.1. BNP 418. On admission, she was noted to be in Afib with RVR. She was initially on metop, but became hypotensive with rising O2 requirements this morning and her metop was stopped and amiodarone was started. She has been moved to the ICU. Cardiology is consulted for ongoing management of her Afib with RVR.  Currently, the patient is on 15L NRB satting 98%. Remains in Afib with RVR with HR 110-130s.   Past Medical History:  Diagnosis Date   Bladder incontinence    Depression    GERD (gastroesophageal reflux disease)    High cholesterol    Hypothyroid    Recurrent falls    Stroke [facility])     Past Surgical History:  Procedure Laterality Date   CARPAL TUNNEL RELEASE     CATARACT EXTRACTION Bilateral    EYE SURGERY Left 02/15/15    Laser surgery      Home Medications:   Prior to Admission medications   Medication Sig Start Date End Date Taking? Authorizing Provider  Acetaminophen (TYLENOL) 325 MG CAPS Take 1 tablet by mouth every 6 (six) hours as needed (pain).   Yes [provider]  albuterol (VENTOLIN HFA) 108 (90 Base) MCG/ACT inhaler Inhale 2 puffs into the lungs every 6 (six) hours as needed for wheezing or shortness of breath.   Yes [provider]  apixaban (ELIQUIS) 5 MG TABS tablet Take 5 mg by mouth 2 (two) times daily.   Yes [provider]  ascorbic acid (VITAMIN C) 500 MG tablet Take 500 mg by mouth daily.   Yes [provider]  atorvastatin (LIPITOR) 10 MG tablet Take 10 mg by mouth daily. 09/26/20  Yes [provider]  benzonatate (TESSALON) 200 MG capsule Take 200 mg by mouth 3 (three) times daily. 07/27/22  Yes [provider]  CALCIUM PO Take 500 mg by mouth 2 (two) times daily.   Yes [provider]  cholecalciferol (VITAMIN D) 1000 units tablet Take 1,000 Units by mouth daily.   Yes [provider]  denosumab (PROLIA) 60 MG/ML SOSY injection Inject 60 mg into the skin every 6 (six) months.   Yes [provider]  furosemide (LASIX) 20 MG tablet Take 1 tablet (20 mg  total) by mouth daily. 07/12/22  Yes Jerline Pain, MD  levothyroxine (SYNTHROID, LEVOTHROID) 88 MCG tablet Take 88 mcg by mouth daily before breakfast.   Yes [provider]  MELATONIN PO Take 10 mg by mouth at bedtime.   Yes [provider]  metoprolol tartrate (LOPRESSOR) 25 MG tablet Take 0.5 tablets (12.5 mg total) by mouth 2 (two) times daily. 07/31/22 08/30/22 Yes Curatolo, Adam, DO  pantoprazole (PROTONIX) 40 MG tablet Take 40 mg by mouth 2 (two) times daily. 12/06/20  Yes [provider]  polyethylene glycol (MIRALAX) 17 g packet Take 17 g by mouth as needed for mild constipation or moderate constipation.   Yes [provider]  sertraline (ZOLOFT) 50 MG tablet  Take 50 mg by mouth daily.    Yes [provider]  vitamin B-12 (CYANOCOBALAMIN) 1000 MCG tablet Take 1,000 mcg by mouth daily.   Yes [provider]  methylPREDNISolone (MEDROL DOSEPAK) 4 MG TBPK tablet Take by mouth as directed. Patient not taking: Reported on 07/31/2022 07/27/22   [provider]    Inpatient Medications: Scheduled Meds:  apixaban  5 mg Oral BID   atorvastatin  10 mg Oral Daily   azithromycin  500 mg Oral Daily   levothyroxine  88 mcg Oral Q0600   melatonin  5 mg Oral QHS   metoprolol tartrate  12.5 mg Oral BID   pantoprazole  40 mg Oral BID   potassium chloride  40 mEq Oral Once   sertraline  50 mg Oral Daily   sodium chloride flush  3 mL Intravenous Q12H   Continuous Infusions:  sodium chloride     amiodarone 60 mg/hr (08/10/22 0843)   Followed by   amiodarone     cefTRIAXone (ROCEPHIN)  IV 2 g (08/10/22 0204)   lactated ringers     PRN Meds: acetaminophen **OR** acetaminophen, albuterol, ondansetron (ZOFRAN) IV, mouth rinse, polyethylene glycol  Allergies:    Allergies  Allergen Reactions   Ciprofloxacin     Pt does not know reaction    Fosamax [Alendronate Sodium] Other (See Comments)    Upset stomach    Social History:   Social History   Socioeconomic History   Marital status: Widowed    Spouse name: Joe    Number of children: 4   Years of education: 16+   Highest education level: Not on file  Occupational History   Not on file  Tobacco Use   Smoking status: Never    Passive exposure: Never   Smokeless tobacco: Never  Vaping Use   Vaping Use: Never used  Substance and Sexual Activity   Alcohol use: Yes    Alcohol/week: 0.0 standard drinks of alcohol    Comment: Very rarley    Drug use: No   Sexual activity: Not on file    Comment: widowed, 2 daughters and 1 son. Retired Education officer, museum  Other Topics Concern   Not on file  Social History Narrative   Lives at home with husband   Caffeine use:  2 cups  coffee every morning   Occ[employer]   Social Determinants of Health   Financial Resource Strain: Not on file  Food Insecurity: No Food Insecurity (08/22/2022)   Hunger Vital Sign    Worried About Running Out of Food in the Last Year: Never true    Ran Out of Food in the Last Year: Never true  Transportation Needs: No Transportation Needs (08/07/2022)   PRAPARE - Transportation  Lack of Transportation (Medical): No    Lack of Transportation (Non-Medical): No  Physical Activity: Not on file  Stress: Not on file  Social Connections: Not on file  Intimate Partner Violence: Not At Risk (08/31/2022)   Humiliation, Afraid, Rape, and Kick questionnaire    Fear of Current or Ex-Partner: No    Emotionally Abused: No    Physically Abused: No    Sexually Abused: No    Family History:    Family History  Problem Relation Age of Onset   Heart disease Mother    Throat cancer Maternal Aunt    Cancer Maternal Aunt        breast ca   Lupus Daughter    Anuerysm Daughter        Brain     ROS:  Please see the history of present illness.   All other ROS reviewed and negative.     Physical Exam/Data:   Vitals:   08/10/22 0730 08/10/22 0800 08/10/22 0806 08/10/22 0823  BP: (!) 82/68 (!) 82/70 92/68 (!) 88/76  Pulse: (!) 128   (!) 132  Resp:    20  Temp:      TempSrc:      SpO2:    97%  Weight:      Height:       No intake or output data in the 24 hours ending 08/10/22 0912    08/28/2022    1:59 PM 07/31/2022    2:16 PM 07/12/2022    4:02 PM  Last 3 Weights  Weight (lbs) 157 lb 13.6 oz 156 lb 157 lb  Weight (kg) 71.6 kg 70.761 kg 71.215 kg     Body mass index is 24.72 kg/m.  General:  Elderly female, dyspneic, coughing throughout exam HEENT: normal Neck: no JVD Vascular: No carotid bruits Cardiac:  Tachycardic, irregular, no murmurs Lungs:  Diffuse rhonchi throughout Abd: soft, nontender, no hepatomegaly  Ext: no edema. warm Musculoskeletal:  No deformities, BUE  and BLE strength normal and equal Skin: warm and dry  Neuro:  CNs 2-12 intact, no focal abnormalities noted Psych:  Normal affect   EKG:  The EKG was personally reviewed and demonstrates:  Afib with RVR with mild ST depressions likely rate related Telemetry:  Telemetry was personally reviewed and demonstrates:  Afib with RVR with HR up to 150s  Relevant CV Studies: TTE 01/2021: IMPRESSIONS     1. Left ventricular ejection fraction, by estimation, is 65 to 70%. The  left ventricle has normal function. The left ventricle has no regional  wall motion abnormalities. Left ventricular diastolic parameters are  consistent with Grade I diastolic  dysfunction (impaired relaxation).   2. Right ventricular systolic function is normal. The right ventricular  size is normal.   3. The mitral valve is abnormal. Trivial mitral valve regurgitation.   4. The aortic valve is tricuspid. Aortic valve regurgitation is not  visualized. Mild aortic valve sclerosis is present, with no evidence of  aortic valve stenosis.   5. The inferior vena cava IVC appears spontaneously collapsed, suggestive  of volume depletion and a low RA pressure <3 mmHg.   Conclusion(s)/Recommendation(s): Consider volume depletion as possible  etiology of near-syncope.    Laboratory Data:  High Sensitivity Troponin:   Recent Labs  Lab 08/22/2022 0147 08/11/2022 0408  TROPONINIHS 14 15     Chemistry Recent Labs  Lab 08/08/2022 0147 08/29/2022 0226 08/10/22 0615  NA 137 142 135  K 3.0* 3.1* 4.2  CL 103  102 102  CO2 22  --  23  GLUCOSE 165* 178* 147*  BUN '18 16 18  '$ CREATININE 0.75 0.90 1.08*  CALCIUM 8.9  --  8.8*  MG  --   --  2.2  GFRNONAA >60  --  49*  ANIONGAP 12  --  10    Recent Labs  Lab 09/01/2022 0147  PROT 7.9  ALBUMIN 3.7  AST 26  ALT 20  ALKPHOS 61  BILITOT 0.8   Lipids No results for input(s): "CHOL", "TRIG", "HDL", "LABVLDL", "LDLCALC", "CHOLHDL" in the last 168 hours.  Hematology Recent Labs   Lab 09/01/2022 0147 08/13/2022 0226 08/10/22 0615  WBC 15.0*  --  10.5  RBC 4.33  --  4.04  HGB 12.8 13.6 11.9*  HCT 40.9 40.0 39.1  MCV 94.5  --  96.8  MCH 29.6  --  29.5  MCHC 31.3  --  30.4  RDW 14.8  --  14.8  PLT 229  --  175   Thyroid No results for input(s): "TSH", "FREET4" in the last 168 hours.  BNP Recent Labs  Lab 09/02/2022 0147  BNP 418.6*    DDimer No results for input(s): "DDIMER" in the last 168 hours.   Radiology/Studies:  DG Chest 2 View  Result Date: 08/10/2022 CLINICAL DATA:  Suspect sepsis. EXAM: CHEST - 2 VIEW COMPARISON:  07/31/2022 FINDINGS: Increasing opacity in the right infrahilar region concerning for pneumonia. Left lung clear. Heart is borderline in size. No effusions. Aortic atherosclerosis. IMPRESSION: Right infrahilar airspace opacity concerning for pneumonia. Electronically Signed   By: Rolm Baptise M.D.   On: 08/24/2022 02:14     Assessment and Plan:   #Afib with RVR: CHADs-vasc 6. Presented with sepsis from pneumonia with course complicated by Afib with RVR with HR up to 150s. Was initially on metop but became hypotensive and has since been switched to amiodarone. Suspect HR will continue to improve as she clinically improves from her sepsis. Will continue amiodarone gtt today at '60mg'$ /hr with re-bolus of '150mg'$  as needed. Can also dig load if rate control becomes difficult. Once BP improves, will add back metop. She has been maintained on apixaban for [facility]. -Received amiodarone bolus -Continue gtt at '60mg'$ /hr today; can re-bolus '150mg'$  of amiodarone if needed for additional rate control -Can also dig load if needed  -Once BP improves, will add back metoprolol -Continue apixaban '5mg'$  BID for [facility] -Management of pneumonia per primary team  #Sepsis secondary to Pneumonia: Patient presented with cough, SOB and chest pain found to have RLL opacity consistent with pneumonia. Lactate elevated on admission with WBC of 15.On broad spectrum ABX. -Management  per primary team  #Acute Hypoxic Respiratory Failure: Secondary to pneumonia as above. On NRB currently. Management per PCCM.  #History of CVA: On apixaban and lipitor  Plan discussed with patient, her 2 daughters and nursing at beside.  CRITICAL CARE TIME: I have spent a total of 45 minutes with patient reviewing hospital notes, telemetry, EKGs, labs and examining the patient as well as establishing an assessment and plan that was discussed with the patient.  > 50% of time was spent in direct patient care. The patient is critically ill with multi-organ system failure and requires high complexity decision making for assessment and support, frequent evaluation and titration of therapies, application of advanced monitoring technologies and extensive interpretation of multiple databases.    Risk Assessment/Risk Scores:          CHA2DS2-VASc Score = 6   This  indicates a 9.7% annual risk of stroke. The patient's score is based upon: CHF History: 0 HTN History: 1 Diabetes History: 0 Stroke History: 2 Vascular Disease History: 0 Age Score: 2 Gender Score: 1         For questions or updates, please contact [place] Please consult [email or web address] for contact info under    Signed, Freada Bergeron, MD  08/10/2022 9:12 AM

## 2022-08-11 DIAGNOSIS — J189 Pneumonia, unspecified organism: Secondary | ICD-10-CM | POA: Diagnosis not present

## 2022-08-11 DIAGNOSIS — A419 Sepsis, unspecified organism: Secondary | ICD-10-CM | POA: Diagnosis not present

## 2022-08-11 DIAGNOSIS — I4891 Unspecified atrial fibrillation: Secondary | ICD-10-CM | POA: Diagnosis not present

## 2022-08-11 DIAGNOSIS — J9601 Acute respiratory failure with hypoxia: Secondary | ICD-10-CM | POA: Diagnosis not present

## 2022-08-11 LAB — CBC
HCT: 38.2 % (ref 36.0–46.0)
Hemoglobin: 11.9 g/dL — ABNORMAL LOW (ref 12.0–15.0)
MCH: 29.8 pg (ref 26.0–34.0)
MCHC: 31.2 g/dL (ref 30.0–36.0)
MCV: 95.7 fL (ref 80.0–100.0)
Platelets: 192 10*3/uL (ref 150–400)
RBC: 3.99 MIL/uL (ref 3.87–5.11)
RDW: 14.7 % (ref 11.5–15.5)
WBC: 10.9 10*3/uL — ABNORMAL HIGH (ref 4.0–10.5)
nRBC: 0 % (ref 0.0–0.2)

## 2022-08-11 LAB — BASIC METABOLIC PANEL
Anion gap: 11 (ref 5–15)
BUN: 24 mg/dL — ABNORMAL HIGH (ref 8–23)
CO2: 21 mmol/L — ABNORMAL LOW (ref 22–32)
Calcium: 8.5 mg/dL — ABNORMAL LOW (ref 8.9–10.3)
Chloride: 103 mmol/L (ref 98–111)
Creatinine, Ser: 1.16 mg/dL — ABNORMAL HIGH (ref 0.44–1.00)
GFR, Estimated: 45 mL/min — ABNORMAL LOW (ref >60)
Glucose, Bld: 168 mg/dL — ABNORMAL HIGH (ref 70–99)
Potassium: 3.9 mmol/L (ref 3.5–5.1)
Sodium: 135 mmol/L (ref 135–145)

## 2022-08-11 MED ORDER — METOPROLOL TARTRATE 12.5 MG HALF TABLET
12.5000 mg | ORAL_TABLET | Freq: Two times a day (BID) | ORAL | Status: DC
  Administered 2022-08-11 (×2): 12.5 mg via ORAL
  Filled 2022-08-11 (×2): qty 1

## 2022-08-11 NOTE — Progress Notes (Signed)
RT placed patient on BIPAP due to patients oxygen sats being low. Patients sats are above 92% and patient is tolerating well.

## 2022-08-11 NOTE — Progress Notes (Signed)
OT Cancellation Note  Patient Details Name: Patricia [name] MRN: [id number] DOB: 19-Aug-1930   Cancelled Treatment:    Reason Eval/Treat Not Completed: Patient not medically ready. Hold for now per RN. Will follow.  Brewster Wolters L Jameer Storie 08/11/2022, 3:39 PM

## 2022-08-11 NOTE — Progress Notes (Signed)
PT Cancellation Note  Patient Details Name: Patricia [name] MRN: [id number] DOB: [birth year]   Cancelled Treatment:     PT order received but eval deferred at request of RN - pt not medically ready.  Will follow.   Patricia [name] 08/11/2022, 4:20 PM

## 2022-08-11 NOTE — Progress Notes (Signed)
NAME:  Patricia [name], MRN:  [id number], DOB:  1929-10-22, LOS: 2 ADMISSION DATE:  08/10/2022, CONSULTATION DATE:  08/10/22 REFERRING MD:  Dr. Tawanna Solo, [facility], CHIEF COMPLAINT:  Respiratory distress   History of Present Illness:  88 yo female presented to [facility] ER with chest pain, dyspnea, back pain, cough with sputum, subjective fever (Tm 100.3 F).  SpO2 in 80's on room air in ER.  CXR showed Rt infrahilar ASD and she was started on ABx for community acquired pneumonia.  She developed increased respiratory distress, hypoxia with increased O2 needs, A fib with RVR and hypotension on 08/10/22.  She was transferred to ICU and PCCM consulted to assist with ICU management.  Pertinent  Medical History  CVA, Hypothyroidism, GERD, A fib on eliquis, Depression, Bladder incontinence, HLD  Significant Hospital Events: Including procedures, antibiotic start and stop dates in addition to other pertinent events   11/03 Admit 11/04 Transfer to ICU, cardiology consulted, started amiodarone gtt  Interim History / Subjective:  Breathing better and cough decreased.  Still needing increased O2.  Objective   Blood pressure 113/76, pulse (!) 58, temperature 98.4 F (36.9 C), temperature source Axillary, resp. rate 17, height '5\' 7"'$  (1.702 m), weight 71.6 kg, SpO2 94 %.    FiO2 (%):  [50 %-70 %] 70 %   Intake/Output Summary (Last 24 hours) at 08/11/2022 1017 Last data filed at 08/11/2022 0845 Gross per 24 hour  Intake 4338.22 ml  Output 200 ml  Net 4138.22 ml   Filed Weights   08/08/2022 1359  Weight: 71.6 kg    Examination:  General - alert Eyes - pupils reactive ENT - no sinus tenderness, no stridor Cardiac - irregular Chest - better air movement, scattered rhonchi Abdomen - soft, non tender, + bowel sounds Extremities - no cyanosis, clubbing, or edema Skin - no rashes Neuro - normal strength, moves extremities, follows commands Psych - normal mood and behavior  Resolved Hospital Problem list      Assessment & Plan:   Acute hypoxic respiratory failure from community acquired pneumonia. - supplemental oxygen to keep SpO2 > 92% - f/u CXR 11/06 - continue bronchial hygiene - Abx day 4  A fib with RVR. Hx of HLD. - suspect RVR is related to respiratory distress/hypoxia with increased sympathetic drive - cardiology consulted - starting low dose lopressor - continue amiodarone gtt for now - continue lipitor, eliquis  Hx of depression. - continue zoloft  Hypothyroidism. - continue synthroid  Deconditioning. - will consult PT/OT  D/w Dr. Johney Frame   Best Practice (right click and "Reselect all SmartList Selections" daily)   Diet/type: Regular consistency (see orders) DVT prophylaxis: DOAC GI prophylaxis: PPI Lines: N/A Foley:  N/A Code Status:  full code Last date of multidisciplinary goals of care discussion [updated pt's family at bedside]  [facility]ixon & Units 08/11/2022    2:51 AM 08/10/2022    6:15 AM 08/31/2022    2:26 AM  CMP  Glucose 70 - 99 mg/dL 168  147  178   BUN 8 - 23 mg/dL '24  18  16   '$ Creatinine 0.44 - 1.00 mg/dL 1.16  1.08  0.90   Sodium 135 - 145 mmol/L 135  135  142   Potassium 3.5 - 5.1 mmol/L 3.9  4.2  3.1   Chloride 98 - 111 mmol/L 103  102  102   CO2 22 - 32 mmol/L 21  23    Calcium  8.9 - 10.3 mg/dL 8.5  8.8         Latest Ref Rng & Units 08/11/2022    2:51 AM 08/10/2022    6:15 AM 08/13/2022    2:26 AM  CBC  WBC 4.0 - 10.5 K/uL 10.9  10.5    Hemoglobin 12.0 - 15.0 g/dL 11.9  11.9  13.6   Hematocrit 36.0 - 46.0 % 38.2  39.1  40.0   Platelets 150 - 400 K/uL 192  175      ABG    Component Value Date/Time   PHART 7.34 (L) 08/10/2022 0922   PCO2ART 41 08/10/2022 0922   PO2ART 99 08/10/2022 0922   HCO3 22.1 08/10/2022 0922   TCO2 25 08/13/2022 0226   ACIDBASEDEF 3.7 (H) 08/10/2022 0922   O2SAT 100 08/10/2022 3005   Signature:  Chesley Mires, MD [place] Pager - [phone] -  [phone] 08/11/2022, 9:18 AM

## 2022-08-12 ENCOUNTER — Inpatient Hospital Stay (HOSPITAL_COMMUNITY): Payer: Medicare PPO

## 2022-08-12 ENCOUNTER — Telehealth: Payer: Self-pay | Admitting: Cardiology

## 2022-08-12 ENCOUNTER — Inpatient Hospital Stay (HOSPITAL_COMMUNITY): Payer: Medicare PPO | Admitting: Anesthesiology

## 2022-08-12 DIAGNOSIS — I4891 Unspecified atrial fibrillation: Secondary | ICD-10-CM | POA: Diagnosis not present

## 2022-08-12 DIAGNOSIS — J9601 Acute respiratory failure with hypoxia: Secondary | ICD-10-CM | POA: Diagnosis not present

## 2022-08-12 DIAGNOSIS — I959 Hypotension, unspecified: Secondary | ICD-10-CM | POA: Diagnosis not present

## 2022-08-12 DIAGNOSIS — E8770 Fluid overload, unspecified: Secondary | ICD-10-CM | POA: Diagnosis not present

## 2022-08-12 DIAGNOSIS — A419 Sepsis, unspecified organism: Secondary | ICD-10-CM | POA: Diagnosis not present

## 2022-08-12 DIAGNOSIS — E440 Moderate protein-calorie malnutrition: Secondary | ICD-10-CM | POA: Insufficient documentation

## 2022-08-12 DIAGNOSIS — J189 Pneumonia, unspecified organism: Secondary | ICD-10-CM | POA: Diagnosis not present

## 2022-08-12 LAB — GLUCOSE, CAPILLARY
Glucose-Capillary: 137 mg/dL — ABNORMAL HIGH (ref 70–99)
Glucose-Capillary: 146 mg/dL — ABNORMAL HIGH (ref 70–99)
Glucose-Capillary: 124 mg/dL — ABNORMAL HIGH (ref 70–99)
Glucose-Capillary: 140 mg/dL — ABNORMAL HIGH (ref 70–99)

## 2022-08-12 LAB — BLOOD GAS, ARTERIAL
Acid-base deficit: 9.3 mmol/L — ABNORMAL HIGH (ref 0.0–2.0)
Bicarbonate: 18.8 mmol/L — ABNORMAL LOW (ref 20.0–28.0)
MECHVT: 410 mL
O2 Content: 100 L/min
O2 Saturation: 90.7 %
PEEP: 8 cmH2O
Patient temperature: 36.8
RATE: 16 resp/min
pCO2 arterial: 48 mmHg (ref 32–48)
pH, Arterial: 7.2 — ABNORMAL LOW (ref 7.35–7.45)
pO2, Arterial: 63 mmHg — ABNORMAL LOW (ref 83–108)

## 2022-08-12 LAB — RESPIRATORY PANEL BY PCR

## 2022-08-12 LAB — BASIC METABOLIC PANEL
Anion gap: 12 (ref 5–15)
Anion gap: 13 (ref 5–15)
BUN: 28 mg/dL — ABNORMAL HIGH (ref 8–23)
BUN: 33 mg/dL — ABNORMAL HIGH (ref 8–23)
CO2: 19 mmol/L — ABNORMAL LOW (ref 22–32)
CO2: 20 mmol/L — ABNORMAL LOW (ref 22–32)
Calcium: 8.9 mg/dL (ref 8.9–10.3)
Calcium: 8.6 mg/dL — ABNORMAL LOW (ref 8.9–10.3)
Chloride: 103 mmol/L (ref 98–111)
Chloride: 101 mmol/L (ref 98–111)
Creatinine, Ser: 1.21 mg/dL — ABNORMAL HIGH (ref 0.44–1.00)
Creatinine, Ser: 1.61 mg/dL — ABNORMAL HIGH (ref 0.44–1.00)
GFR, Estimated: 43 mL/min — ABNORMAL LOW (ref >60)
GFR, Estimated: 30 mL/min — ABNORMAL LOW (ref >60)
Glucose, Bld: 162 mg/dL — ABNORMAL HIGH (ref 70–99)
Glucose, Bld: 145 mg/dL — ABNORMAL HIGH (ref 70–99)
Potassium: 4.1 mmol/L (ref 3.5–5.1)
Potassium: 3.9 mmol/L (ref 3.5–5.1)
Sodium: 134 mmol/L — ABNORMAL LOW (ref 135–145)
Sodium: 134 mmol/L — ABNORMAL LOW (ref 135–145)

## 2022-08-12 LAB — HEPARIN LEVEL (UNFRACTIONATED): Heparin Unfractionated: >1.1 IU/mL — ABNORMAL HIGH (ref 0.30–0.70)

## 2022-08-12 LAB — CBC
HCT: 38 % (ref 36.0–46.0)
Hemoglobin: 11.8 g/dL — ABNORMAL LOW (ref 12.0–15.0)
MCH: 29.6 pg (ref 26.0–34.0)
MCHC: 31.1 g/dL (ref 30.0–36.0)
MCV: 95.2 fL (ref 80.0–100.0)
Platelets: 189 10*3/uL (ref 150–400)
RBC: 3.99 MIL/uL (ref 3.87–5.11)
RDW: 15 % (ref 11.5–15.5)
WBC: 11.7 10*3/uL — ABNORMAL HIGH (ref 4.0–10.5)
nRBC: 0 % (ref 0.0–0.2)

## 2022-08-12 LAB — STREP PNEUMONIAE URINARY ANTIGEN: Strep Pneumo Urinary Antigen: NEGATIVE

## 2022-08-12 LAB — APTT
aPTT: 45 seconds — ABNORMAL HIGH (ref 24–36)
aPTT: 47 seconds — ABNORMAL HIGH (ref 24–36)

## 2022-08-12 LAB — MAGNESIUM: Magnesium: 1.9 mg/dL (ref 1.7–2.4)

## 2022-08-12 LAB — LEGIONELLA PNEUMOPHILA SEROGP 1 UR AG: L. pneumophila Serogp 1 Ur Ag: NEGATIVE

## 2022-08-12 MED ORDER — FUROSEMIDE 10 MG/ML IJ SOLN
20.0000 mg | Freq: Once | INTRAMUSCULAR | Status: AC
  Administered 2022-08-12: 20 mg via INTRAVENOUS
  Filled 2022-08-12: qty 2

## 2022-08-12 MED ORDER — LIDOCAINE HCL (CARDIAC) PF 100 MG/5ML IV SOSY
PREFILLED_SYRINGE | INTRAVENOUS | Status: DC | PRN
  Administered 2022-08-12: 30 mg via INTRAVENOUS

## 2022-08-12 MED ORDER — PROPOFOL 1000 MG/100ML IV EMUL
INTRAVENOUS | Status: AC
  Administered 2022-08-12: 5 ug/kg/min via INTRAVENOUS
  Filled 2022-08-12: qty 100

## 2022-08-12 MED ORDER — KETAMINE HCL 50 MG/5ML IJ SOSY
PREFILLED_SYRINGE | INTRAMUSCULAR | Status: AC
  Filled 2022-08-12: qty 5

## 2022-08-12 MED ORDER — ETOMIDATE 2 MG/ML IV SOLN
INTRAVENOUS | Status: DC | PRN
  Administered 2022-08-12: 14 mg via INTRAVENOUS

## 2022-08-12 MED ORDER — LEVOTHYROXINE SODIUM 88 MCG PO TABS
88.0000 ug | ORAL_TABLET | Freq: Every day | ORAL | Status: DC
  Administered 2022-08-13 – 2022-08-17 (×5): 88 ug
  Filled 2022-08-12 (×6): qty 1

## 2022-08-12 MED ORDER — ETOMIDATE 2 MG/ML IV SOLN
INTRAVENOUS | Status: AC
  Filled 2022-08-12: qty 20

## 2022-08-12 MED ORDER — SODIUM CHLORIDE 0.9 % IV BOLUS
500.0000 mL | Freq: Once | INTRAVENOUS | Status: AC
  Administered 2022-08-12: 500 mL via INTRAVENOUS

## 2022-08-12 MED ORDER — ATORVASTATIN CALCIUM 10 MG PO TABS
10.0000 mg | ORAL_TABLET | Freq: Every day | ORAL | Status: DC
  Administered 2022-08-12 – 2022-08-17 (×6): 10 mg
  Filled 2022-08-12 (×6): qty 1

## 2022-08-12 MED ORDER — HEPARIN (PORCINE) 25000 UT/250ML-% IV SOLN
1100.0000 [IU]/h | INTRAVENOUS | Status: DC
  Administered 2022-08-12: 1000 [IU]/h via INTRAVENOUS
  Administered 2022-08-13: 1100 [IU]/h via INTRAVENOUS
  Filled 2022-08-12 (×2): qty 250

## 2022-08-12 MED ORDER — PROPOFOL 1000 MG/100ML IV EMUL: 5.0000 ug/kg/min | INTRAVENOUS | Status: DC

## 2022-08-12 MED ORDER — NOREPINEPHRINE 4 MG/250ML-% IV SOLN
2.0000 ug/min | INTRAVENOUS | Status: DC
  Administered 2022-08-12: 8 ug/min via INTRAVENOUS
  Administered 2022-08-12: 11 ug/min via INTRAVENOUS
  Filled 2022-08-12 (×2): qty 250

## 2022-08-12 MED ORDER — FENTANYL CITRATE PF 50 MCG/ML IJ SOSY: 50.0000 ug | PREFILLED_SYRINGE | INTRAMUSCULAR | Status: DC | PRN

## 2022-08-12 MED ORDER — LORAZEPAM 2 MG/ML IJ SOLN
0.5000 mg | INTRAMUSCULAR | Status: DC | PRN
  Administered 2022-08-12: 0.5 mg via INTRAVENOUS
  Filled 2022-08-12: qty 1

## 2022-08-12 MED ORDER — MIDAZOLAM HCL 2 MG/2ML IJ SOLN
INTRAMUSCULAR | Status: AC
  Filled 2022-08-12: qty 2

## 2022-08-12 MED ORDER — MIDAZOLAM HCL 2 MG/2ML IJ SOLN: 1.0000 mg | INTRAMUSCULAR | Status: DC | PRN

## 2022-08-12 MED ORDER — FENTANYL BOLUS VIA INFUSION
25.0000 ug | INTRAVENOUS | Status: DC | PRN
  Administered 2022-08-12 – 2022-08-13 (×5): 25 ug via INTRAVENOUS
  Administered 2022-08-15 (×2): 50 ug via INTRAVENOUS
  Administered 2022-08-17: 100 ug via INTRAVENOUS
  Administered 2022-08-17 – 2022-08-18 (×6): 50 ug via INTRAVENOUS
  Administered 2022-08-18: 100 ug via INTRAVENOUS
  Administered 2022-08-18: 50 ug via INTRAVENOUS

## 2022-08-12 MED ORDER — ROCURONIUM BROMIDE 10 MG/ML (PF) SYRINGE
PREFILLED_SYRINGE | INTRAVENOUS | Status: AC
  Filled 2022-08-12: qty 10

## 2022-08-12 MED ORDER — ORAL CARE MOUTH RINSE
15.0000 mL | OROMUCOSAL | Status: DC
  Administered 2022-08-12 – 2022-08-17 (×51): 15 mL via OROMUCOSAL

## 2022-08-12 MED ORDER — MIDAZOLAM HCL 2 MG/2ML IJ SOLN: 2.0000 mg | INTRAMUSCULAR | Status: DC | PRN

## 2022-08-12 MED ORDER — SODIUM CHLORIDE 0.9 % IV SOLN: 250.0000 mL | INTRAVENOUS | Status: DC

## 2022-08-12 MED ORDER — SUCCINYLCHOLINE CHLORIDE 200 MG/10ML IV SOSY
PREFILLED_SYRINGE | INTRAVENOUS | Status: DC | PRN
  Administered 2022-08-12: 100 mg via INTRAVENOUS

## 2022-08-12 MED ORDER — FENTANYL CITRATE PF 50 MCG/ML IJ SOSY: 25.0000 ug | PREFILLED_SYRINGE | Freq: Once | INTRAMUSCULAR | Status: DC

## 2022-08-12 MED ORDER — SUCCINYLCHOLINE CHLORIDE 200 MG/10ML IV SOSY
PREFILLED_SYRINGE | INTRAVENOUS | Status: AC
  Filled 2022-08-12: qty 10

## 2022-08-12 MED ORDER — ALBUMIN HUMAN 25 % IV SOLN
12.5000 g | Freq: Once | INTRAVENOUS | Status: AC
  Administered 2022-08-12: 12.5 g via INTRAVENOUS
  Filled 2022-08-12: qty 50

## 2022-08-12 MED ORDER — FUROSEMIDE 10 MG/ML IJ SOLN
40.0000 mg | Freq: Once | INTRAMUSCULAR | Status: AC
  Administered 2022-08-12: 40 mg via INTRAVENOUS
  Filled 2022-08-12: qty 4

## 2022-08-12 MED ORDER — AZITHROMYCIN 250 MG PO TABS
500.0000 mg | ORAL_TABLET | Freq: Every day | ORAL | Status: AC
  Administered 2022-08-12 – 2022-08-13 (×2): 500 mg
  Filled 2022-08-12: qty 2

## 2022-08-12 MED ORDER — LACTATED RINGERS IV BOLUS: 500.0000 mL | Freq: Once | INTRAVENOUS | Status: DC

## 2022-08-12 MED ORDER — FENTANYL CITRATE (PF) 100 MCG/2ML IJ SOLN
INTRAMUSCULAR | Status: AC
  Filled 2022-08-12: qty 2

## 2022-08-12 MED ORDER — ORAL CARE MOUTH RINSE: 15.0000 mL | OROMUCOSAL | Status: DC | PRN

## 2022-08-12 MED ORDER — FENTANYL 2500MCG IN NS 250ML (10MCG/ML) PREMIX INFUSION
25.0000 ug/h | INTRAVENOUS | Status: DC
  Administered 2022-08-12: 25 ug/h via INTRAVENOUS
  Administered 2022-08-13 – 2022-08-15 (×2): 75 ug/h via INTRAVENOUS
  Administered 2022-08-17: 25 ug/h via INTRAVENOUS
  Filled 2022-08-12 (×4): qty 250

## 2022-08-12 MED ORDER — FAMOTIDINE 20 MG PO TABS
20.0000 mg | ORAL_TABLET | Freq: Two times a day (BID) | ORAL | Status: DC
  Administered 2022-08-12 – 2022-08-13 (×3): 20 mg
  Filled 2022-08-12 (×3): qty 1

## 2022-08-12 MED ORDER — NOREPINEPHRINE 4 MG/250ML-% IV SOLN
INTRAVENOUS | Status: AC
  Administered 2022-08-12: 2 ug/min via INTRAVENOUS
  Filled 2022-08-12: qty 250

## 2022-08-12 MED ORDER — FENTANYL CITRATE PF 50 MCG/ML IJ SOSY
PREFILLED_SYRINGE | INTRAMUSCULAR | Status: AC
  Filled 2022-08-12: qty 2

## 2022-08-12 NOTE — Telephone Encounter (Signed)
Per daughter pt is in hospital and wanted to make sure DCCV was cancelled and it appears this has been done Will let Dr Marlou Porch know pt is in hospital and  will reschedule procedure pending discharge and need

## 2022-08-12 NOTE — Progress Notes (Addendum)
ANTICOAGULATION CONSULT NOTE   Pharmacy Consult for heparin Indication:  atrial fibrillation (PTA Eliquis on hold)  Allergies  Allergen Reactions   Ciprofloxacin     Pt does not know reaction    Fosamax [Alendronate Sodium] Other (See Comments)    Upset stomach    Patient Measurements: Height: '5\' 7"'$  (170.2 cm) Weight: 71.6 kg (157 lb 13.6 oz) IBW/kg (Calculated) : 61.6 Heparin Dosing Weight: 71 kg  Vital Signs: Temp: 98.8 F (37.1 C) (11/06 0800) Temp Source: Axillary (11/06 0400) BP: 88/61 (11/06 0700) Pulse Rate: 37 (11/06 0800)  Labs: Recent Labs    08/10/22 0615 08/11/22 0251 08/12/22 0303  HGB 11.9* 11.9* 11.8*  HCT 39.1 38.2 38.0  PLT 175 192 189  CREATININE 1.08* 1.16* 1.21*    Estimated Creatinine Clearance: 30.1 mL/min (A) (by C-G formula based on SCr of 1.21 mg/dL (H)).   Medications:  - on Eliquis 5 mg bid PTA  Assessment: Patient's a 88 y.o F with hx afib on Eliquis PTA who presented to the ED on 09/01/2022 with c/o SOB and CP.  CXR on 08/10/2022 showed findings with concern for PNA. She was started on abx on admission for infection.  She became hypoxic on 08/12/22 and was intubated.  CXR on 08/12/22 showed progressive bilateral pleural effusions. Pharmacy has been consulted on 08/12/2022 to transition Eliquis to heparin drip in anticipation for thoracentesis.  - last dose of Eliquis given to pt on 08/11/22 at 2133  Today, 08/12/2022: - cbc stable - no bleeding documented - scr trending up with 1.21 (crcl ~30)  Goal of Therapy:  Heparin level 0.3-0.7 units/ml aPTT 66-102 seconds Monitor platelets by anticoagulation protocol: Yes   Plan:  - baseline heparin level and aPTT now - start heparin drip at 1000 units/hr at 10a - check 8 hr heparin level and aPTT - monitor for s/sx bleeding  Patricia [name] P 08/12/2022,8:30 AM  _________________________________ Adden: baseline aPTT 45 secs and HL >1.10 (elevated d/t effect of Eliquis) - will monitor with aPTT  at this time until aPTT and heparin levels correlate, then with just heparin level alone.  Dia Sitter, PharmD, BCPS 08/12/2022 11:21 AM

## 2022-08-12 NOTE — Telephone Encounter (Signed)
Noted - procedure has been cancelled.

## 2022-08-12 NOTE — Progress Notes (Signed)
RN removed 3 rings from patient's hand due to swelling. RN educated family that rings needed to be removed before swelling worsens. 3 rings were given to daughter Tomi Bamberger for safekeeping.

## 2022-08-12 NOTE — Progress Notes (Signed)
NAME:  Patricia [name], MRN:  [id number], DOB:  [birth year], LOS: 3 ADMISSION DATE:  09/05/2022, CONSULTATION DATE:  08/10/22 REFERRING MD:  Dr. Tawanna Solo, [facility], CHIEF COMPLAINT:  Respiratory distress   History of Present Illness:  88 yo female presented to [facility] ER with chest pain, dyspnea, back pain, cough with sputum, subjective fever (Tm 100.3 F).  SpO2 in 80's on room air in ER.  CXR showed Rt infrahilar ASD and she was started on ABx for community acquired pneumonia.  She developed increased respiratory distress, hypoxia with increased O2 needs, A fib with RVR and hypotension on 08/10/22.  She was transferred to ICU and PCCM consulted to assist with ICU management.  Pertinent  Medical History  CVA, Hypothyroidism, GERD, A fib on eliquis, Depression, Bladder incontinence, HLD  Significant Hospital Events: Including procedures, antibiotic start and stop dates in addition to other pertinent events   11/03 Admit 11/04 Transfer to ICU, cardiology consulted, started amiodarone gtt  Interim History / Subjective:  Required intubation overnight due to persistent hypoxia. CXR post intubation with bialteral effusions. +7L this AM. Currently on 9 Levo.  Objective   Blood pressure 102/64, pulse (!) 49, temperature 99.1 F (37.3 C), resp. rate 17, height '5\' 7"'$  (1.702 m), weight 71.6 kg, SpO2 97 %.    Vent Mode: PRVC FiO2 (%):  [60 %-100 %] 90 % Set Rate:  [16 bmp] 16 bmp Vt Set:  [410 mL] 410 mL PEEP:  [8 cmH20] 8 cmH20 Plateau Pressure:  [17 cmH20-26 cmH20] 17 cmH20   Intake/Output Summary (Last 24 hours) at 08/12/2022 0806 Last data filed at 08/12/2022 0654 Gross per 24 hour  Intake 2845.16 ml  Output --  Net 2845.16 ml    Filed Weights   08/29/2022 1359  Weight: 71.6 kg    Examination: General: Elderly female, resting in bed, in NAD. Neuro: Sedated, not responsive. HEENT: [place]/AT. Sclerae anicteric. ETT in place. Cardiovascular: IRIR, no M/R/G.  Lungs: Respirations even and  unlabored.  CTA bilaterally, No W/R/R. Abdomen: BS x 4, soft, NT/ND.  Musculoskeletal: No gross deformities, 1+ edema.  Skin: Intact, warm, no rashes.   Assessment & Plan:   Acute hypoxic respiratory failure from community acquired pneumonia - s/p intubation overnight 11/5. - full vent support. - wean as able. - bronchial hygiene. - follow CXR. - continue Azithromycin, Ceftraixone and follow cultures.  Bilateral pleural effusions and volume overload. - '20mg'$  Lasix x 1 now, caution given hypotension (on '20mg'$  Lasix daily as outpatient). - 12.5g Albumin x 1. - Follow CXR in AM 11/7, might need to consider thora if no improvement and O2 requirements on vent not improved (currently 90% and 8 PEEP).  A fib with RVR. Hx of HLD. - cardiology following, appreciate the assistance. - D/c Lopressor given hypotension. - continue amiodarone gtt for now. - Change Eliquis to Heparin gtt given that she is intubated and might require thoracentesis over next day or so. - continue lipitor.  Hypotension - presumed 2/2 above + vent + sedation. - Continue Levophed, hope to wean and avoid CVL. - Limit sedation as able. - d/c Propofol.  Hx of depression. - Hold home zoloft.  Hypothyroidism. - continue synthroid.  Deconditioning. - PT/OT when able.    Best Practice (right click and "Reselect all SmartList Selections" daily)   Diet/type: NPO DVT prophylaxis: systemic heparin (in place of home Apixaban) GI prophylaxis: H2B Lines: N/A Foley:  N/A Code Status:  full code Last date of multidisciplinary goals of care discussion [  updated pt's family at bedside]  CC time: 35 min.   Montey Hora, [facility] For pager details, please see AMION or use Epic chat  After 1900, please call [facility] for cross coverage needs 08/12/2022, 8:19 AM

## 2022-08-12 NOTE — Procedures (Signed)
Central Venous Catheter Insertion Procedure Note  Patricia [name]  [id number]  October 28, 1929  Date:08/12/22  Time:6:33 PM   Provider Performing:Pairlee Sawtell Shearon Stalls   Procedure: Insertion of Non-tunneled Central Venous [phone]) with US guidance ([id number])   Indication(s) Medication administration  Consent Risks of the procedure as well as the alternatives and risks of each were explained to the patient and/or caregiver.  Consent for the procedure was obtained and is signed in the bedside chart  Anesthesia Topical only with 1% lidocaine   Timeout Verified patient identification, verified procedure, site/side was marked, verified correct patient position, special equipment/implants available, medications/allergies/relevant history reviewed, required imaging and test results available.  Sterile Technique Maximal sterile technique including full sterile barrier drape, hand hygiene, sterile gown, sterile gloves, mask, hair covering, sterile ultrasound probe cover (if used).  Procedure Description Area of catheter insertion was cleaned with chlorhexidine and draped in sterile fashion.  With real-time ultrasound guidance a central venous catheter was placed into the left internal jugular vein. Nonpulsatile blood flow and easy flushing noted in all ports.  The catheter was sutured in place and sterile dressing applied.  Complications/Tolerance None; patient tolerated the procedure well. Chest X-ray is ordered to verify placement for internal jugular or subclavian cannulation.   Chest x-ray is not ordered for femoral cannulation.  EBL Minimal  Specimen(s) None   Note: single horizontal mattress suture was placed around catheter insertion site and tied down to skin due to mild bleeding plus being on heparin. No bleeding thereafter.    Patricia [name], [place] [facility] For pager details, please see AMION or use Epic chat  After 1900, please call [facility] for cross  coverage needs 08/12/2022, 6:34 PM

## 2022-08-12 NOTE — Progress Notes (Signed)
eLink Physician-Brief Progress Note Patient Name: Patricia [name] DOB: 09-Nov-1929 MRN: [id number]   Date of Service  08/12/2022  HPI/Events of Note  Pt SpO2 persistently in he 70s despite FiO2 of 100% on HFNC.  Placed back on NIPPV, with FiO2 100%, but SpO2 remains mid 70s at best.   eICU Interventions  Spoke with her daughter who was at bedside, explained situation, and she was agreeable with intubation. She had also spoken with the patient who confirms that the she was OK with intubation.  Decision made to proceed with intubation.  Will start with TV 37m/kg PBW, target plateau pressures <30 Titrate FIO2 and PEEP to target SPO2 >90% Serial ABG, will make further vent changes as appropriate.  Will plan to initiate sedation with fentanyl, propofol once intubated        Cashmere Dingley M DELA CRUZ 08/12/2022, 4:09 AM

## 2022-08-12 NOTE — Telephone Encounter (Signed)
Daughter called to report patient is currently in hospital on a ventilator.  Daughter would like a call back to reschedule surgery.

## 2022-08-12 NOTE — TOC Initial Note (Signed)
Transition of Care [facility]) - Initial/Assessment Note    Patient Details  Name: Patricia [name] MRN: [id number] Date of Birth: [birth year]  Transition of Care [facility]) CM/SW Contact:    Leeroy Cha, RN Phone Number: 08/12/2022, 9:04 AM  Clinical Narrative:                  Transition of Care [facility]) Screening Note   Patient Details  Name: Patricia [name] Date of Birth: [birth year]   Transition of Care [facility]) CM/SW Contact:    Leeroy Cha, RN Phone Number: 08/12/2022, 9:04 AM    Transition of Care Department (TOC) has reviewed patient and no TOC needs have been identified at this time. We will continue to monitor patient advancement through interdisciplinary progression rounds. If new patient transition needs arise, please place a TOC consult.    Expected Discharge Plan: Home/Self Care Barriers to Discharge: Continued Medical Work up   Patient Goals and CMS Choice Patient states their goals for this hospitalization and ongoing recovery are:: to go home CMS Medicare.gov Compare Post Acute Care list provided to:: Patient Choice offered to / list presented to : Patient  Expected Discharge Plan and Services Expected Discharge Plan: Home/Self Care   Discharge Planning Services: CM Consult   Living arrangements for the past 2 months: Single Family Home                                      Prior Living Arrangements/Services Living arrangements for the past 2 months: Single Family Home Lives with:: Self Patient language and need for interpreter reviewed:: Yes Do you feel safe going back to the place where you live?: Yes               Activities of Daily Living Home Assistive Devices/Equipment: Walker (specify type), Built-in shower seat, Bedside commode/3-in-1, Cane (specify quad or straight) ADL Screening (condition at time of admission) Patient's cognitive ability adequate to safely complete daily activities?: No Is the patient deaf or have difficulty  hearing?: No Does the patient have difficulty seeing, even when wearing glasses/contacts?: No Does the patient have difficulty concentrating, remembering, or making decisions?: Yes Patient able to express need for assistance with ADLs?: Yes Does the patient have difficulty dressing or bathing?: Yes Independently performs ADLs?: No Communication: Independent Dressing (OT): Needs assistance Is this a change from baseline?: Pre-admission baseline Grooming: Needs assistance Is this a change from baseline?: Pre-admission baseline Feeding: Needs assistance Is this a change from baseline?: Pre-admission baseline Bathing: Needs assistance Is this a change from baseline?: Pre-admission baseline Toileting: Needs assistance Is this a change from baseline?: Pre-admission baseline In/Out Bed: Needs assistance Is this a change from baseline?: Pre-admission baseline Walks in Home: Needs assistance Is this a change from baseline?: Pre-admission baseline Does the patient have difficulty walking or climbing stairs?: Yes Weakness of Legs: Both Weakness of Arms/Hands: Both  Permission Sought/Granted                  Emotional Assessment Appearance:: Appears stated age   Affect (typically observed): Calm Orientation: : Oriented to Self, Oriented to Place, Oriented to  Time, Oriented to Situation Alcohol / Substance Use: Alcohol Use (etoh use occasional) Psych Involvement: No (comment)  Admission diagnosis:  Hypokalemia [E87.6] CAP (community acquired pneumonia) [J18.9] Sepsis due to pneumonia ([place]) [J18.9, A41.9] Community acquired pneumonia of right lower lobe of lung [J18.9] Patient Active  Problem List   Diagnosis Date Noted   Hypotension 08/12/2022   Hypervolemia 08/12/2022   Sepsis due to pneumonia ([place]) 08/29/2022   Atrial fibrillation with RVR ([place]) 08/22/2022   Hypokalemia 08/30/2022   Acute respiratory failure with hypoxia ([place]) 08/21/2022   CAP (community acquired pneumonia)  09/03/2022   Intractable back pain 12/22/2018   Hypothyroid 12/22/2018   Hyperglycemia 12/22/2018   Recurrent falls 04/27/2018   Small fiber neuropathy 09/03/2016   Compression fracture of thoracic vertebra ([place]) 06/21/2016   MGUS (monoclonal gammopathy of unknown significance) 02/16/2016   Quality of life palliative care encounter 02/16/2016   Elevated blood pressure reading 02/16/2016   Polyneuropathy 01/15/2016   Neuropathy 01/10/2016   Occipital stroke ([place]) 02/21/2015   PCP:  Deland Pretty, MD Pharmacy:   [place], [place] [address] Phone: [phone] Fax: [phone]  [facility] [place], [place] [address] Phone: [phone] Fax: [phone]     Social Determinants of Health (SDOH) Interventions    Readmission Risk Interventions   No data to display

## 2022-08-12 NOTE — Progress Notes (Signed)
PT Cancellation Note  Patient Details Name: Patricia [name] MRN: [id number] DOB: 01-02-1930   Cancelled Treatment:    Reason Eval/Treat Not Completed: Medical issues which prohibited therapy, PT will sign off and await  extubation for  new order.  [place] Office [phone] Weekend [phone]    Claretha Cooper 08/12/2022, 7:48 AM

## 2022-08-12 NOTE — Progress Notes (Signed)
Initial Nutrition Assessment  DOCUMENTATION CODES:  Non-severe (moderate) malnutrition in context of acute illness/injury  INTERVENTION:  -Initiate nutrition when pt is hemodynamically stable. -If/when EN may begin, recommend running over 19hrs due to morning synthroid dose, 0630/0700-0200 -If EN needed, recommend initiating Osmolite 1.2 @ 89m/hr x 19hrs with 637mProsource TF20 BID. As tolerated, increase EN by 1029m 12hrs to goal rate of 100m42m x 19hrs (9100mL40mal volume) w/ 60mL 62mource TF 20 BID to provide 1300kcal, 93g protein and 779mL f69mwater.   NUTRITION DIAGNOSIS:  Moderate Malnutrition related to acute illness as evidenced by energy intake < 75% for > 7 days, mild fat depletion, moderate muscle depletion, edema.  GOAL:  Patient will meet greater than or equal to 90% of their needs  MONITOR:  Vent status  REASON FOR ASSESSMENT:  Ventilator   ASSESSMENT:  Pt is a 88yo F 88yo PMH of CVA, hypothyroidism, GERD, HLD and a-fib who presents with SOB, productive cough, and pain involving her chest and back. Requiring intubation on 11/6 for persistent hypoxia.  Visited pt at bedside this morning. Family present to provide history. Daughter reports pt does not follow a special therapeutic diet and was told by cardiology as outpt that she doesn't need one. No significant weight loss noted. Weight history in EMR shows stable weight x 1.5 years. Decreased po intake noted within the last couple of weeks. Daughter says when pt is eating poorly, she will give her a Premier protein shake. NFPE shows mild fat loss, moderate muscle wasting and mild edema. Pt meets ASPEN criteria for moderate protein calorie malnutrition r/t acute illness.  Family is concerned with pt's nutritional status. However, pt is on increasing doses of levophed to maintain MAP >65. Explained to family that nutrition support is not appropriate at this time. Note last known BM PTA, >3 days ago. Consider starting bowel  regimen as clinically appropriate. Propofol currently at 2.1mL/hr 27mprovide just 55kcal from fat/day.   Once MAP maintained on low, stable or decreasing dose of pressors, consider initiating Osmolite 1.2 @ 20mL/hr 86mhrs with 60mL Pros51me TF20 BID. As tolerated, increase EN by 10mL q 12h60mo goal rate of 100mL/hr x 148m (9100mL total v86me) w/ 60mL Prosourc46m 20 BID to provide 1300kcal, 93g protein and 779mL free wate53mN over 19 hours to allow EN to be off for 4 hours before synthroid and 0.5-1hour after. Recommended schedule: 06:30/07:00-02:00.  Medications reviewed and include: lipitor, azithromycin, pepcid, lasix, propofol, synthroid, KCl, albumin, amiodarone, rocephin, fentanyl, levophed, prn ativan, prn versed  Labs reviewed: Na:134, K:4.1, BG:137-162, BUN:28, Cr:1.21, GFR:43  NUTRITION - FOCUSED PHYSICAL EXAM:  Flowsheet Row Most Recent Value  Orbital Region Mild depletion  Upper Arm Region Moderate depletion  Thoracic and Lumbar Region Mild depletion  Buccal Region Unable to assess  Temple Region Moderate depletion  Clavicle Bone Region Moderate depletion  Clavicle and Acromion Bone Region Moderate depletion  Scapular Bone Region Unable to assess  Dorsal Hand Mild depletion  Patellar Region No depletion  Anterior Thigh Region No depletion  Posterior Calf Region No depletion  Edema (RD Assessment) Mild  Hair Reviewed  Eyes Reviewed  Mouth Reviewed  Skin Reviewed  Nails Reviewed       Diet Order:   Diet Order             Diet NPO time specified  Diet effective now  EDUCATION NEEDS:  Education needs have been addressed  Skin:  Skin Assessment: Reviewed RN Assessment  Last BM:  PTA, >3 days  Height:  Ht Readings from Last 1 Encounters:  09/03/2022 '5\' 7"'$  (1.702 m)   Weight:  Wt Readings from Last 1 Encounters:  08/30/2022 71.6 kg    BMI:  Body mass index is 24.72 kg/m.  Estimated Nutritional Needs:  Kcal:  1300-1575kcal ([employer]:1362kcal) Protein:  85-110g (1.2-1.5g/kg) Fluid:  1650-1832m (23-210mkg)  KaCandise BowensMS, RD, LDN, CNSC See AMiON for contact information

## 2022-08-12 NOTE — Progress Notes (Signed)
Rounding Note    Patient Name: Patricia [name] Date of Encounter: 08/12/2022  [place] Cardiologist: Dr. Marlou Porch   Subjective   Intubated overnight. Many family members at bedside--discussed atrial fibrillation, possible management, risk of stroke and role of anticoagulation. All questions answered.  Inpatient Medications    Scheduled Meds:  atorvastatin  10 mg Per Tube Daily   azithromycin  500 mg Per Tube Daily   Chlorhexidine Gluconate Cloth  6 each Topical Daily   famotidine  20 mg Per Tube BID   fentaNYL (SUBLIMAZE) injection  25 mcg Intravenous Once   [START ON 08/13/2022] levothyroxine  88 mcg Per Tube Q0600   mouth rinse  15 mL Mouth Rinse Q2H   potassium chloride  40 mEq Oral Once   sodium chloride flush  3 mL Intravenous Q12H   succinylcholine       Continuous Infusions:  sodium chloride     amiodarone 30 mg/hr (08/12/22 1055)   cefTRIAXone (ROCEPHIN)  IV Stopped (08/12/22 0219)   fentaNYL infusion INTRAVENOUS 50 mcg/hr (08/12/22 0654)   heparin 1,000 Units/hr (08/12/22 1040)   norepinephrine (LEVOPHED) Adult infusion 11 mcg/min (08/12/22 1200)   PRN Meds: acetaminophen **OR** acetaminophen, albuterol, fentaNYL, midazolam, ondansetron (ZOFRAN) IV, mouth rinse, polyethylene glycol, succinylcholine   Vital Signs    Vitals:   08/12/22 1030 08/12/22 1045 08/12/22 1100 08/12/22 1115  BP: 95/69 (!) 88/54 (!) 77/55 106/66  Pulse: 81 81 70 (!) 36  Resp: 18 (!) '21 19 18  '$ Temp: 98.4 F (36.9 C) 98.4 F (36.9 C) 98.4 F (36.9 C) 98.4 F (36.9 C)  TempSrc:      SpO2: 100% 100% 99% 100%  Weight:      Height:        Intake/Output Summary (Last 24 hours) at 08/12/2022 1205 Last data filed at 08/12/2022 0654 Gross per 24 hour  Intake 2427.33 ml  Output --  Net 2427.33 ml      08/20/2022    1:59 PM 07/31/2022    2:16 PM 07/12/2022    4:02 PM  Last 3 Weights  Weight (lbs) 157 lb 13.6 oz 156 lb 157 lb  Weight (kg) 71.6 kg 70.761 kg 71.215 kg       Telemetry    Atrial fibrillation, rate controlled - Personally Reviewed  ECG    No new - Personally Reviewed  Physical Exam   GEN: intubated and sedated NECK: No JVD appreciated CARDIAC: irregularly irregular rhythm, normal S1 and S2, no rubs or gallops. No murmur. VASCULAR: Radial pulses 2+ bilaterally.  RESPIRATORY:  ventilated breath sounds, diminished at bases ABDOMEN: Soft, non-tender, non-distended MUSCULOSKELETAL:  normal bulk SKIN: Warm and dry, no edema NEUROLOGIC/PSYCHIATRIC:  intubated and sedated   Labs    High Sensitivity Troponin:   Recent Labs  Lab 08/15/2022 0147 08/19/2022 0408  TROPONINIHS 14 15     Chemistry Recent Labs  Lab 08/08/2022 0147 08/31/2022 0226 08/10/22 0615 08/11/22 0251 08/12/22 0303  NA 137   < > 135 135 134*  K 3.0*   < > 4.2 3.9 4.1  CL 103   < > 102 103 103  CO2 22  --  23 21* 19*  GLUCOSE 165*   < > 147* 168* 162*  BUN 18   < > 18 24* 28*  CREATININE 0.75   < > 1.08* 1.16* 1.21*  CALCIUM 8.9  --  8.8* 8.5* 8.9  MG  --   --  2.2  --  1.9  PROT 7.9  --   --   --   --   ALBUMIN 3.7  --   --   --   --   AST 26  --   --   --   --   ALT 20  --   --   --   --   ALKPHOS 61  --   --   --   --   BILITOT 0.8  --   --   --   --   GFRNONAA >60  --  49* 45* 43*  ANIONGAP 12  --  '10 11 12   '$ < > = values in this interval not displayed.    Lipids No results for input(s): "CHOL", "TRIG", "HDL", "LABVLDL", "LDLCALC", "CHOLHDL" in the last 168 hours.  Hematology Recent Labs  Lab 08/10/22 0615 08/11/22 0251 08/12/22 0303  WBC 10.5 10.9* 11.7*  RBC 4.04 3.99 3.99  HGB 11.9* 11.9* 11.8*  HCT 39.1 38.2 38.0  MCV 96.8 95.7 95.2  MCH 29.5 29.8 29.6  MCHC 30.4 31.2 31.1  RDW 14.8 14.7 15.0  PLT 175 192 189   Thyroid No results for input(s): "TSH", "FREET4" in the last 168 hours.  BNP Recent Labs  Lab 08/30/2022 0147  BNP 418.6*    DDimer No results for input(s): "DDIMER" in the last 168 hours.   Radiology    Portable Chest  x-ray  Result Date: 08/12/2022 CLINICAL DATA:  Endotracheal tube EXAM: PORTABLE CHEST 1 VIEW COMPARISON:  Two days ago FINDINGS: Endotracheal tube with tip at the clavicular heads. The enteric tube reaches the stomach. Increased hazy opacity at the bases from pleural fluid. There is presumed superimposed atelectasis of the obscured lower lobes. Cardiomegaly. No pneumothorax or cephalization of blood flow. IMPRESSION: 1. Unremarkable hardware. 2. Cardiomegaly with progressive bilateral pleural effusions since 2 days ago Electronically Signed   By: Jorje Guild M.D.   On: 08/12/2022 06:06    Cardiac Studies   No new this admission  Patient Profile     88 y.o. female with a hx of prior CVA, hypothyroidism, GERD and known Afib on apixaban who presented with sepsis secondary to pneumonia with course complicated by Afib with RVR for which Cardiology has been consulted.   Assessment & Plan    #Afib with RVR: -suspect driven by respiratory infection -CHADs-vasc 6 -Continue amiodarone and heparin drips -blood pressure low, beta blocker stopped given need for pressors -restart apixaban '5mg'$  BID when more stable -Management of pneumonia per primary team   #Sepsis secondary to Pneumonia #Acute Hypoxic Respiratory Failure #Pleural effusion -intubated overnight due to persistent hypoxia -now on heparin drip, primary team considering thoracentesis -management per primary team   #History of CVA: -anticoagulation as above -continue statin  Per family, patient wished for full code/aggressive medical management.   Time Spent with Patient: I have spent a total of 55 minutes in patient care, including reviewing hospital notes, telemetry, EKGs, labs; discussing with the team; examining the patient; and establishing an assessment and plan that was discussed with the patient.  > 50% of time was spent in direct patient care.    For questions or updates, please contact [place] Please  consult [email or web address] for contact info under     Signed, Buford Dresser, MD  08/12/2022, 12:05 PM

## 2022-08-12 NOTE — Progress Notes (Signed)
eLink Physician-Brief Progress Note Patient Name: Patricia [name] DOB: Oct 03, 1930 MRN: [id number]   Date of Service  08/12/2022  HPI/Events of Note  BP again downtrending, MAP <65  eICU Interventions  Levophed started, being uptitrated.  Will give second bolus of normal saline.  Propofol down to 61mg/kg/min.  Plan to give ativan, fentanyl if with breakthrough agitation.         Donal Lynam M DELA CRUZ 08/12/2022, 6:25 AM

## 2022-08-12 NOTE — Progress Notes (Signed)
eLink Physician-Brief Progress Note Patient Name: Patricia [name] DOB: [birth year] MRN: [id number]   Date of Service  08/12/2022  HPI/Events of Note  Pt anxious, would remove HFNC with resulting desaturation to the 60s. She remains full code.   eICU Interventions  Placed order for low dose ativan. Will monitor response.         Dornell Grasmick M DELA CRUZ 08/12/2022, 3:30 AM

## 2022-08-12 NOTE — Progress Notes (Signed)
OT Cancellation Note  Patient Details Name: Royale Lennartz MRN: [id number] DOB: 14-Nov-1929   Cancelled Treatment:    Reason Eval/Treat Not Completed: Medical issues which prohibited therapy Patient was recently intubated this AM with low MAP per notes in chart.on day 3 of attempting evaluation and patient not being medically ready, OT to sign off at this time. Please re order skilled OT services when patient is medically ready Rennie Plowman, [place] [facility] Office# [phone]  08/12/2022, 7:55 AM

## 2022-08-12 NOTE — Progress Notes (Signed)
ANTICOAGULATION CONSULT NOTE   Pharmacy Consult for heparin Indication:  atrial fibrillation (PTA Eliquis on hold)  Allergies  Allergen Reactions   Ciprofloxacin     Pt does not know reaction    Fosamax [Alendronate Sodium] Other (See Comments)    Upset stomach    Patient Measurements: Height: '5\' 7"'$  (170.2 cm) Weight: 71.6 kg (157 lb 13.6 oz) IBW/kg (Calculated) : 61.6 Heparin Dosing Weight: 71 kg  Vital Signs: Temp: 98.8 F (37.1 C) (11/06 2000) Temp Source: Bladder (11/06 2000) BP: 92/59 (11/06 2000) Pulse Rate: 34 (11/06 2000)  Labs: Recent Labs    08/10/22 0615 08/11/22 0251 08/12/22 0303 08/12/22 0919 08/12/22 1914  HGB 11.9* 11.9* 11.8*  --   --   HCT 39.1 38.2 38.0  --   --   PLT 175 192 189  --   --   APTT  --   --   --  45* 47*  HEPARINUNFRC  --   --   --  >1.10*  --   CREATININE 1.08* 1.16* 1.21*  --  1.61*     Estimated Creatinine Clearance: 22.6 mL/min (A) (by C-G formula based on SCr of 1.61 mg/dL (H)).   Medications:  - on Eliquis 5 mg bid PTA  Assessment: Patient's a 88 y.o F with hx afib on Eliquis PTA who presented to the ED on 08/19/2022 with c/o SOB and CP.  CXR on 09/01/2022 showed findings with concern for PNA. She was started on abx on admission for infection.  She became hypoxic on 08/12/22 and was intubated.  CXR on 08/12/22 showed progressive bilateral pleural effusions. Pharmacy has been consulted on 08/12/2022 to transition Eliquis to heparin drip in anticipation for thoracentesis.  - last dose of Eliquis given to pt on 08/11/22 at 2133  Today, 08/12/2022: - cbc stable - no bleeding documented - scr trending up with 1.21 (crcl ~30)  - 19:14 aPTT 47, subtherapeutic with heparin infusing at 1000 units/hr however when I spoke with the nurse, the heparin had been stopped for about 1.5 hours and restarted at 20:17 this evening  Goal of Therapy:  Heparin level 0.3-0.7 units/ml aPTT 66-102 seconds Monitor platelets by anticoagulation protocol:  Yes   Plan:  - Continue heparin at 1000 units/hr - 8 hr aPTT and heparin level from restart of heparin infusion - Monitor daily aPTT & heparin level until correlating, CBC, signs/symptoms of bleeding  Thank you for allowing pharmacy to be a part of this patient's care.  Royetta Asal, PharmD, BCPS Clinical Pharmacist [place] Please utilize Amion for appropriate phone number to reach the unit pharmacist ([place]) 08/12/2022 8:30 PM

## 2022-08-12 NOTE — Procedures (Signed)
Central Venous Catheter Insertion Procedure Note  Patricia [name]  [id number]  1930/05/13  Date:08/12/22  Time:7:08 PM   Provider Performing:Marijke Guadiana Shearon Stalls   Procedure: Insertion of Non-tunneled Central Venous [phone]) with US guidance ([id number])   Indication(s) Medication administration  Consent Risks of the procedure as well as the alternatives and risks of each were explained to the patient and/or caregiver.  Consent for the procedure was obtained and is signed in the bedside chart  Anesthesia Topical only with 1% lidocaine   Timeout Verified patient identification, verified procedure, site/side was marked, verified correct patient position, special equipment/implants available, medications/allergies/relevant history reviewed, required imaging and test results available.  Sterile Technique Maximal sterile technique including full sterile barrier drape, hand hygiene, sterile gown, sterile gloves, mask, hair covering, sterile ultrasound probe cover (if used).  Procedure Description Area of catheter insertion was cleaned with chlorhexidine and draped in sterile fashion.  With real-time ultrasound guidance a central venous catheter was placed into the right femoral vein. Nonpulsatile blood flow and easy flushing noted in all ports.  The catheter was sutured in place and sterile dressing applied.  Complications/Tolerance None; patient tolerated the procedure well. Chest X-ray is ordered to verify placement for internal jugular or subclavian cannulation.   Chest x-ray is not ordered for femoral cannulation.  EBL Minimal  Specimen(s) None   L IJ CVL placed earlier. CXR showed it malpositioned.  R femoral therefore placed. L IJ to be pulled.   Montey Hora, [place] [facility] For pager details, please see AMION or use Epic chat  After 1900, please call [facility] for cross coverage needs 08/12/2022, 7:09 PM

## 2022-08-12 NOTE — Anesthesia Procedure Notes (Signed)
Procedure Name: Intubation Date/Time: 08/12/2022 4:53 AM  Performed by: Montel Clock, CRNAPre-anesthesia Checklist: Patient identified, Emergency Drugs available, Suction available, Patient being monitored and Timeout performed Patient Re-evaluated:Patient Re-evaluated prior to induction Oxygen Delivery Method: Circle system utilized Preoxygenation: Pre-oxygenation with 100% oxygen Induction Type: IV induction, Rapid sequence and Cricoid Pressure applied Laryngoscope Size: Mac and 3 Grade View: Grade I Tube type: Oral Tube size: 7.5 mm Number of attempts: 1 Airway Equipment and Method: Stylet Placement Confirmation: ETT inserted through vocal cords under direct vision, positive ETCO2 and breath sounds checked- equal and bilateral Secured at: 22 cm Tube secured with: Tape Dental Injury: Teeth and Oropharynx as per pre-operative assessment

## 2022-08-12 NOTE — Progress Notes (Signed)
eLink Physician-Brief Progress Note Patient Name: Patricia [name] DOB: [birth year] MRN: [id number]   Date of Service  08/12/2022  HPI/Events of Note  Pt hypotensive post intubation and sedation.   eICU Interventions  Will downtitrate propofol.  Continue fentanyl. Would add PRM midazolam for breakthrough agitation.  579m bolus ordered.  If pt persistly hypotensive, not responding to fluid boluses, will start levophed drip.         Even Budlong M DELA CRUZ 08/12/2022, 5:21 AM

## 2022-08-13 ENCOUNTER — Inpatient Hospital Stay (HOSPITAL_COMMUNITY): Payer: Medicare PPO

## 2022-08-13 ENCOUNTER — Ambulatory Visit (HOSPITAL_COMMUNITY): Admission: RE | Admit: 2022-08-13 | Payer: Medicare PPO | Source: Home / Self Care | Admitting: Cardiology

## 2022-08-13 ENCOUNTER — Encounter (HOSPITAL_COMMUNITY): Admission: RE | Payer: Self-pay | Source: Home / Self Care

## 2022-08-13 DIAGNOSIS — E8779 Other fluid overload: Secondary | ICD-10-CM | POA: Diagnosis not present

## 2022-08-13 DIAGNOSIS — R0602 Shortness of breath: Secondary | ICD-10-CM

## 2022-08-13 DIAGNOSIS — I4891 Unspecified atrial fibrillation: Secondary | ICD-10-CM | POA: Diagnosis not present

## 2022-08-13 DIAGNOSIS — I429 Cardiomyopathy, unspecified: Secondary | ICD-10-CM

## 2022-08-13 DIAGNOSIS — J189 Pneumonia, unspecified organism: Secondary | ICD-10-CM | POA: Diagnosis not present

## 2022-08-13 DIAGNOSIS — J9601 Acute respiratory failure with hypoxia: Secondary | ICD-10-CM | POA: Diagnosis not present

## 2022-08-13 DIAGNOSIS — A419 Sepsis, unspecified organism: Secondary | ICD-10-CM | POA: Diagnosis not present

## 2022-08-13 LAB — GLUCOSE, CAPILLARY
Glucose-Capillary: 133 mg/dL — ABNORMAL HIGH (ref 70–99)
Glucose-Capillary: 135 mg/dL — ABNORMAL HIGH (ref 70–99)
Glucose-Capillary: 136 mg/dL — ABNORMAL HIGH (ref 70–99)
Glucose-Capillary: 137 mg/dL — ABNORMAL HIGH (ref 70–99)
Glucose-Capillary: 138 mg/dL — ABNORMAL HIGH (ref 70–99)
Glucose-Capillary: 141 mg/dL — ABNORMAL HIGH (ref 70–99)
Glucose-Capillary: 147 mg/dL — ABNORMAL HIGH (ref 70–99)

## 2022-08-13 LAB — ECHOCARDIOGRAM COMPLETE
Area-P 1/2: 4.49 cm2
Calc EF: 27.5 %
Height: 67 in
MV M vel: 4.38 m/s
MV Peak grad: 76.7 mmHg
Radius: 0.4 cm
S' Lateral: 3.7 cm
Single Plane A2C EF: 20.2 %
Single Plane A4C EF: 37 %
Weight: 2525.59 oz

## 2022-08-13 LAB — COMPREHENSIVE METABOLIC PANEL
ALT: 437 U/L — ABNORMAL HIGH (ref 0–44)
AST: 243 U/L — ABNORMAL HIGH (ref 15–41)
Albumin: 2.8 g/dL — ABNORMAL LOW (ref 3.5–5.0)
Alkaline Phosphatase: 100 U/L (ref 38–126)
Anion gap: 17 — ABNORMAL HIGH (ref 5–15)
BUN: 36 mg/dL — ABNORMAL HIGH (ref 8–23)
CO2: 21 mmol/L — ABNORMAL LOW (ref 22–32)
Calcium: 8.8 mg/dL — ABNORMAL LOW (ref 8.9–10.3)
Chloride: 99 mmol/L (ref 98–111)
Creatinine, Ser: 1.38 mg/dL — ABNORMAL HIGH (ref 0.44–1.00)
GFR, Estimated: 36 mL/min — ABNORMAL LOW (ref >60)
Glucose, Bld: 139 mg/dL — ABNORMAL HIGH (ref 70–99)
Potassium: 3.9 mmol/L (ref 3.5–5.1)
Sodium: 137 mmol/L (ref 135–145)
Total Bilirubin: 0.6 mg/dL (ref 0.3–1.2)
Total Protein: 6.2 g/dL — ABNORMAL LOW (ref 6.5–8.1)

## 2022-08-13 LAB — BLOOD GAS, ARTERIAL
Acid-base deficit: 5.7 mmol/L — ABNORMAL HIGH (ref 0.0–2.0)
Bicarbonate: 20.7 mmol/L (ref 20.0–28.0)
Drawn by: 23281
FIO2: 50 %
MECHVT: 430 mL
O2 Saturation: 98 %
PEEP: 8 cmH2O
Patient temperature: 37.7
RATE: 16 resp/min
pCO2 arterial: 44 mmHg (ref 32–48)
pH, Arterial: 7.28 — ABNORMAL LOW (ref 7.35–7.45)
pO2, Arterial: 86 mmHg (ref 83–108)

## 2022-08-13 LAB — BASIC METABOLIC PANEL
Anion gap: 8 (ref 5–15)
BUN: 33 mg/dL — ABNORMAL HIGH (ref 8–23)
CO2: 22 mmol/L (ref 22–32)
Calcium: 8.2 mg/dL — ABNORMAL LOW (ref 8.9–10.3)
Chloride: 102 mmol/L (ref 98–111)
Creatinine, Ser: 1.6 mg/dL — ABNORMAL HIGH (ref 0.44–1.00)
GFR, Estimated: 30 mL/min — ABNORMAL LOW (ref >60)
Glucose, Bld: 150 mg/dL — ABNORMAL HIGH (ref 70–99)
Potassium: 3.9 mmol/L (ref 3.5–5.1)
Sodium: 132 mmol/L — ABNORMAL LOW (ref 135–145)

## 2022-08-13 LAB — CBC
HCT: 36.1 % (ref 36.0–46.0)
Hemoglobin: 11.3 g/dL — ABNORMAL LOW (ref 12.0–15.0)
MCH: 29.6 pg (ref 26.0–34.0)
MCHC: 31.3 g/dL (ref 30.0–36.0)
MCV: 94.5 fL (ref 80.0–100.0)
Platelets: 196 10*3/uL (ref 150–400)
RBC: 3.82 MIL/uL — ABNORMAL LOW (ref 3.87–5.11)
RDW: 15.2 % (ref 11.5–15.5)
WBC: 10 10*3/uL (ref 4.0–10.5)
nRBC: 0 % (ref 0.0–0.2)

## 2022-08-13 LAB — APTT
aPTT: 72 seconds — ABNORMAL HIGH (ref 24–36)
aPTT: 60 seconds — ABNORMAL HIGH (ref 24–36)
aPTT: 82 seconds — ABNORMAL HIGH (ref 24–36)

## 2022-08-13 LAB — HEPARIN LEVEL (UNFRACTIONATED): Heparin Unfractionated: >1.1 IU/mL — ABNORMAL HIGH (ref 0.30–0.70)

## 2022-08-13 LAB — TRIGLYCERIDES: Triglycerides: 63 mg/dL (ref <150)

## 2022-08-13 LAB — PHOSPHORUS: Phosphorus: 4.4 mg/dL (ref 2.5–4.6)

## 2022-08-13 LAB — MAGNESIUM: Magnesium: 1.7 mg/dL (ref 1.7–2.4)

## 2022-08-13 LAB — BRAIN NATRIURETIC PEPTIDE: B Natriuretic Peptide: 636.2 pg/mL — ABNORMAL HIGH (ref 0.0–100.0)

## 2022-08-13 SURGERY — CARDIOVERSION
Anesthesia: General

## 2022-08-13 MED ORDER — NOREPINEPHRINE 4 MG/250ML-% IV SOLN
0.0000 ug/min | INTRAVENOUS | Status: DC
  Administered 2022-08-13: 9 ug/min via INTRAVENOUS
  Administered 2022-08-13: 10 ug/min via INTRAVENOUS
  Administered 2022-08-13 – 2022-08-14 (×4): 9 ug/min via INTRAVENOUS
  Administered 2022-08-14: 8 ug/min via INTRAVENOUS
  Administered 2022-08-15: 6 ug/min via INTRAVENOUS
  Administered 2022-08-15: 5 ug/min via INTRAVENOUS
  Administered 2022-08-16: 8 ug/min via INTRAVENOUS
  Filled 2022-08-13 (×9): qty 250

## 2022-08-13 MED ORDER — AMIODARONE HCL IN DEXTROSE 360-4.14 MG/200ML-% IV SOLN
30.0000 mg/h | INTRAVENOUS | Status: DC
  Administered 2022-08-13 – 2022-08-18 (×12): 30 mg/h via INTRAVENOUS
  Filled 2022-08-13 (×11): qty 200

## 2022-08-13 MED ORDER — AMIODARONE LOAD VIA INFUSION
150.0000 mg | Freq: Once | INTRAVENOUS | Status: AC
  Administered 2022-08-13: 150 mg via INTRAVENOUS
  Filled 2022-08-13: qty 83.34

## 2022-08-13 MED ORDER — AMIODARONE HCL 200 MG PO TABS
200.0000 mg | ORAL_TABLET | Freq: Two times a day (BID) | ORAL | Status: DC
  Administered 2022-08-13: 200 mg via NASOGASTRIC
  Filled 2022-08-13: qty 1

## 2022-08-13 MED ORDER — FAMOTIDINE 20 MG PO TABS
20.0000 mg | ORAL_TABLET | Freq: Every day | ORAL | Status: DC
  Administered 2022-08-14 – 2022-08-17 (×4): 20 mg
  Filled 2022-08-13 (×4): qty 1

## 2022-08-13 MED ORDER — MAGNESIUM SULFATE 2 GM/50ML IV SOLN
2.0000 g | Freq: Once | INTRAVENOUS | Status: AC
  Administered 2022-08-13: 2 g via INTRAVENOUS
  Filled 2022-08-13: qty 50

## 2022-08-13 MED ORDER — FUROSEMIDE 10 MG/ML IJ SOLN
60.0000 mg | Freq: Once | INTRAMUSCULAR | Status: AC
  Administered 2022-08-13: 60 mg via INTRAVENOUS
  Filled 2022-08-13: qty 6

## 2022-08-13 MED ORDER — FUROSEMIDE 10 MG/ML IJ SOLN
60.0000 mg | Freq: Two times a day (BID) | INTRAMUSCULAR | Status: DC
  Administered 2022-08-13 – 2022-08-14 (×2): 60 mg via INTRAVENOUS
  Filled 2022-08-13 (×2): qty 6

## 2022-08-13 NOTE — Progress Notes (Signed)
ANTICOAGULATION CONSULT NOTE - Follow Up Consult  Pharmacy Consult for Heparin Indication: atrial fibrillation  Allergies  Allergen Reactions   Ciprofloxacin     Pt does not know reaction    Fosamax [Alendronate Sodium] Other (See Comments)    Upset stomach    Patient Measurements: Height: '5\' 7"'$  (170.2 cm) Weight: 71.6 kg (157 lb 13.6 oz) IBW/kg (Calculated) : 61.6 Heparin Dosing Weight:  71.6 kg  Vital Signs: Temp: 99.1 F (37.3 C) (11/07 2130) Temp Source: Bladder (11/07 2000) BP: 104/85 (11/07 2130) Pulse Rate: 91 (11/07 2130)  Labs: Recent Labs    08/11/22 0251 08/12/22 0303 08/12/22 0919 08/12/22 0919 08/12/22 1914 08/13/22 0419 08/13/22 1200 08/13/22 1452 08/13/22 2104  HGB 11.9* 11.8*  --   --   --  11.3*  --   --   --   HCT 38.2 38.0  --   --   --  36.1  --   --   --   PLT 192 189  --   --   --  196  --   --   --   APTT  --   --  45*   < > 47* 72* 60*  --  82*  HEPARINUNFRC  --   --  >1.10*  --   --  >1.10*  --   --   --   CREATININE 1.16* 1.21*  --   --  1.61* 1.60*  --  1.38*  --    < > = values in this interval not displayed.    Estimated Creatinine Clearance: 26.3 mL/min (A) (by C-G formula based on SCr of 1.38 mg/dL (H)).  Assessment: AC/Heme: hx afib (CHADSVASC 6)on Eliquis 5 mg bid PTA --> resumed--> changed to heparin drip on 11/6 with plan for thoracentesis - aPTT 82 in goal.  Goal of Therapy:  Heparin level 66-102 units/ml Monitor platelets by anticoagulation protocol: Yes   Plan:  Con't IV heparin at 1100 units/hr Daily HL and CBC   Brandom Kerwin S. Alford Highland, PharmD, BCPS Clinical Staff Pharmacist [email or web address] Alford Highland, Ismerai Bin Stillinger 08/13/2022,9:34 PM

## 2022-08-13 NOTE — Progress Notes (Signed)
NAME:  Patricia [name], MRN:  [id number], DOB:  [birth year], LOS: 4 ADMISSION DATE:  09/03/2022, CONSULTATION DATE:  08/10/22 REFERRING MD:  Dr. Tawanna Solo, [facility], CHIEF COMPLAINT:  Respiratory distress   History of Present Illness:  88 yo female presented to [facility] ER with chest pain, dyspnea, back pain, cough with sputum, subjective fever (Tm 100.3 F).  SpO2 in 80's on room air in ER.  CXR showed Rt infrahilar ASD and she was started on ABx for community acquired pneumonia.  She developed increased respiratory distress, hypoxia with increased O2 needs, A fib with RVR and hypotension on 08/10/22.  She was transferred to ICU and PCCM consulted to assist with ICU management.  Pertinent  Medical History  CVA, Hypothyroidism, GERD, A fib on eliquis, Depression, Bladder incontinence, HLD  Significant Hospital Events: Including procedures, antibiotic start and stop dates in addition to other pertinent events   11/03 Admit 11/04 Transfer to ICU, cardiology consulted, started amiodarone gtt 11/6 RVP positive for rhinovirus  Interim History / Subjective:  Remains on 9 Levo. +8L net since admit. Had only 330 UOP yesterday after '20mg'$  lasix followed by '40mg'$ . CXR slightly improved this AM. CVL placed yesterday evening, L IJ initially but was malpositioned so R femoral placed instead.   Objective   Blood pressure 99/60, pulse 79, temperature 99.3 F (37.4 C), resp. rate 16, height '5\' 7"'$  (1.702 m), weight 71.6 kg, SpO2 94 %.    Vent Mode: PRVC FiO2 (%):  [40 %-60 %] 40 % Set Rate:  [16 bmp] 16 bmp Vt Set:  [410 mL-430 mL] 430 mL PEEP:  [8 cmH20] 8 cmH20 Plateau Pressure:  [16 cmH20-25 cmH20] 22 cmH20   Intake/Output Summary (Last 24 hours) at 08/13/2022 0808 Last data filed at 08/13/2022 0804 Gross per 24 hour  Intake 1937.77 ml  Output 735 ml  Net 1202.77 ml    Filed Weights   09/02/2022 1359  Weight: 71.6 kg    Examination: General: Elderly female, resting in bed, in NAD. Neuro: Sedated,  opens eyes to voice but not yet following commands. HEENT: [place]/AT. Sclerae anicteric. ETT in place. Cardiovascular: IRIR, no M/R/G.  Lungs: Respirations even and unlabored.  CTA bilaterally, No W/R/R. Abdomen: BS x 4, soft, NT/ND.  Musculoskeletal: No gross deformities, 1+ edema.  Skin: Intact, warm, no rashes.   Assessment & Plan:   Acute hypoxic respiratory failure from community acquired pneumonia - s/p intubation overnight 11/5. - full vent support. - wean attempt this AM as sedation is weaned (fentanyl only, currently being weaned). - bronchial hygiene. - follow CXR. - continue Azithromycin, Ceftraixone and follow cultures. Stop date 11/8 for total 7 days.  Bilateral pleural effusions and volume overload - slightly improved on CXR 11/7. - Additional '60mg'$  Lasix this AM. - Hoping to see continued improvement in CXR and effusion to where can avoid thora (appears we might be able to avoid).  A fib with RVR. Hx of HLD. - cardiology following, appreciate the assistance. - Continue to hold Lopressor given hypotension. - continue amiodarone gtt for now, change to PO/per tube when OK with cards. - Continue Heparin for today and can review tomorrow 11/8 whether we can transition back to Eliquis (should know about thora then). - continue lipitor.  Hypotension - presumed 2/2 above + vent + sedation. - Continue Levophed. - Limit sedation as able, have asked RN to wean fentanyl as part of WUA.  Hx of depression. - Hold home zoloft.  Hypothyroidism. - continue synthroid.  Deconditioning. -  PT/OT when able.    Best Practice (right click and "Reselect all SmartList Selections" daily)   Diet/type: NPO, anticipate extubation in next 24 hours so will hold off on TF's DVT prophylaxis: systemic heparin (in place of home Apixaban) GI prophylaxis: H2B Lines: N/A Foley:  N/A Code Status:  full code Last date of multidisciplinary goals of care discussion [updated pt's family at  bedside]  CC time: 30 min.   Montey Hora, [facility] For pager details, please see AMION or use Epic chat  After 1900, please call [place] for cross coverage needs 08/13/2022, 8:08 AM

## 2022-08-13 NOTE — Progress Notes (Signed)
ANTICOAGULATION CONSULT NOTE   Pharmacy Consult for heparin Indication:  atrial fibrillation (PTA Eliquis on hold)  Allergies  Allergen Reactions   Ciprofloxacin     Pt does not know reaction    Fosamax [Alendronate Sodium] Other (See Comments)    Upset stomach    Patient Measurements: Height: '5\' 7"'$  (170.2 cm) Weight: 71.6 kg (157 lb 13.6 oz) IBW/kg (Calculated) : 61.6 Heparin Dosing Weight: 71 kg  Vital Signs: Temp: 99.9 F (37.7 C) (11/07 0400) Temp Source: Bladder (11/07 0400) BP: 102/60 (11/07 0400) Pulse Rate: 108 (11/07 0400)  Labs: Recent Labs    08/11/22 0251 08/12/22 0303 08/12/22 0919 08/12/22 1914 08/13/22 0419  HGB 11.9* 11.8*  --   --  11.3*  HCT 38.2 38.0  --   --  36.1  PLT 192 189  --   --  196  APTT  --   --  45* 47* 72*  HEPARINUNFRC  --   --  >1.10*  --  >1.10*  CREATININE 1.16* 1.21*  --  1.61* 1.60*     Estimated Creatinine Clearance: 22.7 mL/min (A) (by C-G formula based on SCr of 1.6 mg/dL (H)).   Medications:  - on Eliquis 5 mg bid PTA  Assessment: Patient's a 88 y.o F with hx afib on Eliquis PTA who presented to the ED on 09/01/2022 with c/o SOB and CP.  CXR on 08/08/2022 showed findings with concern for PNA. She was started on abx on admission for infection.  She became hypoxic on 08/12/22 and was intubated.  CXR on 08/12/22 showed progressive bilateral pleural effusions. Pharmacy has been consulted on 08/12/2022 to transition Eliquis to heparin drip in anticipation for thoracentesis.  - last dose of Eliquis given to pt on 08/11/22 at 2133  Today, 08/13/2022: - aPTT = 72 sec (therapeutic) with heparin gtt @ 1000 units/hr - heparin level > 1.1 : remains falsely elevated due to lingering effects of apixaban - Hgb 11.3, PLTC wnl - SCr  1.6 - no bleeding documented  Goal of Therapy:  Heparin level 0.3-0.7 units/ml aPTT 66-102 seconds Monitor platelets by anticoagulation protocol: Yes   Plan:  - Continue heparin at 1000 units/hr - Recheck  aPTT in 8 hr to confirm therapeutic dose - Monitor daily aPTT & heparin level until correlating, CBC, signs/symptoms of bleeding  Thank you for allowing pharmacy to be a part of this patient's care.  Leone Haven, PharmD 08/13/2022 6:09 AM

## 2022-08-13 NOTE — Progress Notes (Signed)
[facility] ADULT ICU REPLACEMENT PROTOCOL   The patient does apply for the [facility] Adult ICU Electrolyte Replacment Protocol based on the criteria listed below:   1.Exclusion criteria: TCTS, ECMO, Dialysis, and Myasthenia Gravis patients 2. Is GFR >/= 30 ml/min? Yes.    Patient's GFR today is 30 3. Is SCr </= 2? Yes.   Patient's SCr is 1.60 mg/dL 4. Did SCr increase >/= 0.5 in 24 hours? No. 5.Pt's weight >40kg  Yes.   6. Abnormal electrolyte(s): Mag 1.7  7. Electrolytes replaced per protocol 8.  Call MD STAT for K+ </= 2.5, Phos </= 1, or Mag </= 1 Physician:  Eula Fried [facility] 08/13/2022 6:03 AM

## 2022-08-13 NOTE — Progress Notes (Signed)
  Echocardiogram 2D Echocardiogram has been performed.  Patricia [name] 08/13/2022, 11:12 AM

## 2022-08-13 NOTE — Progress Notes (Signed)
Rounding Note    Patient Name: Patricia [name] Date of Encounter: 08/13/2022  [place] Cardiologist: Dr. Marlou Porch   Subjective   Remains intubated and sedated. Echo in progress during my exam, reviewed at bedside, see comments below.  Inpatient Medications    Scheduled Meds:  amiodarone  200 mg Per NG tube BID   atorvastatin  10 mg Per Tube Daily   Chlorhexidine Gluconate Cloth  6 each Topical Daily   famotidine  20 mg Per Tube BID   fentaNYL (SUBLIMAZE) injection  25 mcg Intravenous Once   levothyroxine  88 mcg Per Tube Q0600   mouth rinse  15 mL Mouth Rinse Q2H   potassium chloride  40 mEq Oral Once   sodium chloride flush  3 mL Intravenous Q12H   Continuous Infusions:  sodium chloride     fentaNYL infusion INTRAVENOUS 50 mcg/hr (08/13/22 1009)   heparin 1,000 Units/hr (08/13/22 1009)   norepinephrine (LEVOPHED) Adult infusion 9 mcg/min (08/13/22 1045)   PRN Meds: acetaminophen **OR** acetaminophen, albuterol, fentaNYL, midazolam, ondansetron (ZOFRAN) IV, mouth rinse, polyethylene glycol   Vital Signs    Vitals:   08/13/22 0900 08/13/22 0915 08/13/22 0930 08/13/22 0945  BP: (!) 87/55 (!) 95/58 91/64 119/75  Pulse: 89 95 67 (!) 42  Resp: '17 16 16 16  '$ Temp: 99.3 F (37.4 C) 99.3 F (37.4 C) 99.1 F (37.3 C) 99.1 F (37.3 C)  TempSrc:      SpO2: 95% 96% 96% 95%  Weight:      Height:        Intake/Output Summary (Last 24 hours) at 08/13/2022 1048 Last data filed at 08/13/2022 1009 Gross per 24 hour  Intake 2077.89 ml  Output 755 ml  Net 1322.89 ml      08/31/2022    1:59 PM 07/31/2022    2:16 PM 07/12/2022    4:02 PM  Last 3 Weights  Weight (lbs) 157 lb 13.6 oz 156 lb 157 lb  Weight (kg) 71.6 kg 70.761 kg 71.215 kg      Telemetry    Atrial fibrillation, rate controlled - Personally Reviewed  ECG    No new - Personally Reviewed  Physical Exam   GEN: intubated and sedated NECK: No JVD appreciated CARDIAC: irregularly irregular  rhythm, normal S1 and S2, no rubs or gallops. No murmur. VASCULAR: Radial pulses 2+ bilaterally.  RESPIRATORY:  ventilated breath sounds, diminished at bases ABDOMEN: Soft, non-tender, non-distended MUSCULOSKELETAL:  normal bulk SKIN: Warm and dry, no significant LE edema NEUROLOGIC/PSYCHIATRIC:  intubated and sedated   Labs    High Sensitivity Troponin:   Recent Labs  Lab 08/22/2022 0147 08/13/2022 0408  TROPONINIHS 14 15     Chemistry Recent Labs  Lab 09/03/2022 0147 08/16/2022 0226 08/10/22 0615 08/11/22 0251 08/12/22 0303 08/12/22 1914 08/13/22 0419  NA 137   < > 135   < > 134* 134* 132*  K 3.0*   < > 4.2   < > 4.1 3.9 3.9  CL 103   < > 102   < > 103 101 102  CO2 22  --  23   < > 19* 20* 22  GLUCOSE 165*   < > 147*   < > 162* 145* 150*  BUN 18   < > 18   < > 28* 33* 33*  CREATININE 0.75   < > 1.08*   < > 1.21* 1.61* 1.60*  CALCIUM 8.9  --  8.8*   < > 8.9  8.6* 8.2*  MG  --   --  2.2  --  1.9  --  1.7  PROT 7.9  --   --   --   --   --   --   ALBUMIN 3.7  --   --   --   --   --   --   AST 26  --   --   --   --   --   --   ALT 20  --   --   --   --   --   --   ALKPHOS 61  --   --   --   --   --   --   BILITOT 0.8  --   --   --   --   --   --   GFRNONAA >60  --  49*   < > 43* 30* 30*  ANIONGAP 12  --  10   < > '12 13 8   '$ < > = values in this interval not displayed.    Lipids  Recent Labs  Lab 08/13/22 0419  TRIG 63    Hematology Recent Labs  Lab 08/11/22 0251 08/12/22 0303 08/13/22 0419  WBC 10.9* 11.7* 10.0  RBC 3.99 3.99 3.82*  HGB 11.9* 11.8* 11.3*  HCT 38.2 38.0 36.1  MCV 95.7 95.2 94.5  MCH 29.8 29.6 29.6  MCHC 31.2 31.1 31.3  RDW 14.7 15.0 15.2  PLT 192 189 196   Thyroid No results for input(s): "TSH", "FREET4" in the last 168 hours.  BNP Recent Labs  Lab 08/08/2022 0147 08/13/22 0419  BNP 418.6* 636.2*    DDimer No results for input(s): "DDIMER" in the last 168 hours.   Radiology    DG Chest Port 1 View  Result Date: 08/13/2022 CLINICAL  DATA:  88 year old female with respiratory failure. Intubated. EXAM: PORTABLE CHEST 1 VIEW COMPARISON:  Portable chest 08/12/2022 and earlier. FINDINGS: Portable AP semi upright view at 0411 hours. Stable endotracheal tube tip just above the clavicles. Enteric tube courses to the abdomen, tip not included. More rotated to the left today. Ongoing bibasilar veiling opacity, dense retrocardiac opacity. No pneumothorax or air bronchograms. Pulmonary vascularity appears stable to decreased. No overt edema. Stable visualized osseous structures. IMPRESSION: 1.  Stable lines and tubes. 2. Stable ventilation with bilateral pleural effusions and lower lobe collapse or consolidation. Electronically Signed   By: Genevie Ann M.D.   On: 08/13/2022 08:32   DG Chest Port 1 View  Result Date: 08/12/2022 CLINICAL DATA:  Central line EXAM: PORTABLE CHEST 1 VIEW COMPARISON:  08/12/2022, 08/10/2022 FINDINGS: Endotracheal tube tip is about 4.7 cm superior to the carina. Esophageal tube tip is below the diaphragm but incompletely visualized. Cardiomegaly with vascular congestion and moderate pleural effusions. Bibasilar airspace disease. Aortic atherosclerosis. New left-sided central venous catheter, the tip is directed towards the patient's left in the axillary region. No visible left pneumothorax IMPRESSION: 1. New left-sided central venous catheter, the tip is directed towards the patient's left in the axillary region. No visible left pneumothorax. 2. Cardiomegaly with vascular congestion and moderate bilateral pleural effusions. Bibasilar airspace disease may be due to atelectasis or pneumonia. These findings do not appear significantly changed. These results will be called to the ordering clinician or representative by the Radiologist Assistant, and communication documented in the PACS or [employer]. Electronically Signed   By: Donavan Foil M.D.   On: 08/12/2022 19:27   Portable Chest  x-ray  Result Date:  08/12/2022 CLINICAL DATA:  Endotracheal tube EXAM: PORTABLE CHEST 1 VIEW COMPARISON:  Two days ago FINDINGS: Endotracheal tube with tip at the clavicular heads. The enteric tube reaches the stomach. Increased hazy opacity at the bases from pleural fluid. There is presumed superimposed atelectasis of the obscured lower lobes. Cardiomegaly. No pneumothorax or cephalization of blood flow. IMPRESSION: 1. Unremarkable hardware. 2. Cardiomegaly with progressive bilateral pleural effusions since 2 days ago Electronically Signed   By: Jorje Guild M.D.   On: 08/12/2022 06:06    Cardiac Studies   Echo in progress, see below  Patient Profile     88 y.o. female with a hx of prior CVA, hypothyroidism, GERD and known Afib on apixaban who presented with sepsis secondary to pneumonia with course complicated by Afib with RVR for which Cardiology has been consulted.   Assessment & Plan    #Cardiomyopathy, new: -I did partial review of bedside echo. EF appears reduced, ~35%. There are both global components and focal components, does not appear to follow a coronary distribution. Mild-moderate MR, likely severe TR. No signficant pericardial effusion -no blood pressure room for GDMT given pressors -agree with diuresis as below -may be stress induced, will need to re-evaluate as an outpatient. Given her current comorbidities, do not anticipate inpatient cath at this time.  #Afib with RVR: -suspect driven by respiratory infection -rates variable -CHADs-vasc 6 -Continue amiodarone, will transition to oral. Continue heparin drip for now, if no plans for thoracentesis then convert to oral DOAC (was on apixaban 5 mg bid) -blood pressure low, beta blocker stopped given need for pressors -Management of pneumonia per primary team   #Sepsis secondary to Pneumonia #Acute Hypoxic Respiratory Failure #Pleural effusion -intubated due to persistent hypoxia -now on heparin drip, primary team considering  thoracentesis -management per primary team -listed as net positive 8 L, minimal urine output charted. Agree with diuresis.   #History of CVA: -anticoagulation as above -continue statin  Per family, patient wished for full code/aggressive medical management.   For questions or updates, please contact [place] Please consult [email or web address] for contact info under     Signed, Buford Dresser, MD  08/13/2022, 10:48 AM

## 2022-08-13 NOTE — Progress Notes (Signed)
ANTICOAGULATION CONSULT NOTE   Pharmacy Consult for heparin Indication:  atrial fibrillation (PTA Eliquis on hold)  Allergies  Allergen Reactions   Ciprofloxacin     Pt does not know reaction    Fosamax [Alendronate Sodium] Other (See Comments)    Upset stomach    Patient Measurements: Height: '5\' 7"'$  (170.2 cm) Weight: 71.6 kg (157 lb 13.6 oz) IBW/kg (Calculated) : 61.6 Heparin Dosing Weight: 71 kg  Vital Signs: Temp: 99 F (37.2 C) (11/07 1200) Temp Source: Bladder (11/07 0400) BP: 94/63 (11/07 1200) Pulse Rate: 62 (11/07 1200)  Labs: Recent Labs    08/11/22 0251 08/12/22 0303 08/12/22 0919 08/12/22 0919 08/12/22 1914 08/13/22 0419 08/13/22 1200  HGB 11.9* 11.8*  --   --   --  11.3*  --   HCT 38.2 38.0  --   --   --  36.1  --   PLT 192 189  --   --   --  196  --   APTT  --   --  45*   < > 47* 72* 60*  HEPARINUNFRC  --   --  >1.10*  --   --  >1.10*  --   CREATININE 1.16* 1.21*  --   --  1.61* 1.60*  --    < > = values in this interval not displayed.     Estimated Creatinine Clearance: 22.7 mL/min (A) (by C-G formula based on SCr of 1.6 mg/dL (H)).   Medications:  - on Eliquis 5 mg bid PTA  Assessment: Patient's a 88 y.o F with hx afib on Eliquis PTA who presented to the ED on 08/17/2022 with c/o SOB and CP.  CXR on 08/25/2022 showed findings with concern for PNA. She was started on abx on admission for infection.  She became hypoxic on 08/12/22 and was intubated.  CXR on 08/12/22 showed progressive bilateral pleural effusions. Pharmacy has been consulted on 08/12/2022 to transition Eliquis to heparin drip in anticipation for possible thoracentesis.  - last dose of Eliquis given to pt on 08/11/22 at 2133  Today, 08/13/2022: - confirmatory aPTT collected at noon is sub-therapeutic at 60 secs - cbc stable - no bleeding documented - scr is elevated at 1.60 (crcl ~23)  Goal of Therapy:  Heparin level 0.3-0.7 units/ml aPTT 66-102 seconds Monitor platelets by  anticoagulation protocol: Yes   Plan:  - increase heparin drip to 1100 units/hr - check 8 hr aPTT - monitor for s/sx bleeding  Marketia Stallsmith P 08/13/2022,12:41 PM

## 2022-08-13 NOTE — Progress Notes (Signed)
eLink Physician-Brief Progress Note Patient Name: Patricia [name] DOB: [birth year] MRN: [id number]   Date of Service  08/13/2022  HPI/Events of Note  CXR reviewed. Cental line malpositioned and has been discontinued, no pneumothorax.  eICU Interventions  Femoral central line not imaged.        Kerry Kass Arnetha Silverthorne 08/13/2022, 12:14 AM

## 2022-08-14 ENCOUNTER — Inpatient Hospital Stay (HOSPITAL_COMMUNITY): Payer: Medicare PPO

## 2022-08-14 DIAGNOSIS — I429 Cardiomyopathy, unspecified: Secondary | ICD-10-CM | POA: Diagnosis not present

## 2022-08-14 DIAGNOSIS — I4811 Longstanding persistent atrial fibrillation: Secondary | ICD-10-CM | POA: Diagnosis not present

## 2022-08-14 DIAGNOSIS — I50811 Acute right heart failure: Secondary | ICD-10-CM

## 2022-08-14 DIAGNOSIS — I4891 Unspecified atrial fibrillation: Secondary | ICD-10-CM | POA: Diagnosis not present

## 2022-08-14 DIAGNOSIS — I5041 Acute combined systolic (congestive) and diastolic (congestive) heart failure: Secondary | ICD-10-CM

## 2022-08-14 DIAGNOSIS — J9601 Acute respiratory failure with hypoxia: Secondary | ICD-10-CM | POA: Diagnosis not present

## 2022-08-14 DIAGNOSIS — A419 Sepsis, unspecified organism: Secondary | ICD-10-CM | POA: Diagnosis not present

## 2022-08-14 DIAGNOSIS — J189 Pneumonia, unspecified organism: Secondary | ICD-10-CM | POA: Diagnosis not present

## 2022-08-14 LAB — CBC
HCT: 34.3 % — ABNORMAL LOW (ref 36.0–46.0)
Hemoglobin: 10.8 g/dL — ABNORMAL LOW (ref 12.0–15.0)
MCH: 29.1 pg (ref 26.0–34.0)
MCHC: 31.5 g/dL (ref 30.0–36.0)
MCV: 92.5 fL (ref 80.0–100.0)
Platelets: 201 10*3/uL (ref 150–400)
RBC: 3.71 MIL/uL — ABNORMAL LOW (ref 3.87–5.11)
RDW: 15.3 % (ref 11.5–15.5)
WBC: 8.9 10*3/uL (ref 4.0–10.5)
nRBC: 0 % (ref 0.0–0.2)

## 2022-08-14 LAB — BASIC METABOLIC PANEL
Anion gap: 12 (ref 5–15)
BUN: 37 mg/dL — ABNORMAL HIGH (ref 8–23)
CO2: 22 mmol/L (ref 22–32)
Calcium: 8.3 mg/dL — ABNORMAL LOW (ref 8.9–10.3)
Chloride: 98 mmol/L (ref 98–111)
Creatinine, Ser: 1.48 mg/dL — ABNORMAL HIGH (ref 0.44–1.00)
GFR, Estimated: 33 mL/min — ABNORMAL LOW (ref >60)
Glucose, Bld: 136 mg/dL — ABNORMAL HIGH (ref 70–99)
Potassium: 3.7 mmol/L (ref 3.5–5.1)
Sodium: 132 mmol/L — ABNORMAL LOW (ref 135–145)

## 2022-08-14 LAB — APTT: aPTT: 73 seconds — ABNORMAL HIGH (ref 24–36)

## 2022-08-14 LAB — LEGIONELLA PNEUMOPHILA SEROGP 1 UR AG

## 2022-08-14 LAB — GLUCOSE, CAPILLARY
Glucose-Capillary: 119 mg/dL — ABNORMAL HIGH (ref 70–99)
Glucose-Capillary: 123 mg/dL — ABNORMAL HIGH (ref 70–99)
Glucose-Capillary: 125 mg/dL — ABNORMAL HIGH (ref 70–99)
Glucose-Capillary: 128 mg/dL — ABNORMAL HIGH (ref 70–99)
Glucose-Capillary: 131 mg/dL — ABNORMAL HIGH (ref 70–99)
Glucose-Capillary: 154 mg/dL — ABNORMAL HIGH (ref 70–99)

## 2022-08-14 LAB — CULTURE, BLOOD (ROUTINE X 2)
Culture: NO GROWTH
Culture: NO GROWTH
Special Requests: ADEQUATE
Special Requests: ADEQUATE

## 2022-08-14 LAB — TROPONIN I (HIGH SENSITIVITY): Troponin I (High Sensitivity): 17 ng/L (ref <18)

## 2022-08-14 LAB — HEPARIN LEVEL (UNFRACTIONATED): Heparin Unfractionated: >1.1 IU/mL — ABNORMAL HIGH (ref 0.30–0.70)

## 2022-08-14 LAB — MAGNESIUM: Magnesium: 2.1 mg/dL (ref 1.7–2.4)

## 2022-08-14 LAB — BRAIN NATRIURETIC PEPTIDE: B Natriuretic Peptide: 512.1 pg/mL — ABNORMAL HIGH (ref 0.0–100.0)

## 2022-08-14 LAB — PHOSPHORUS: Phosphorus: 4.7 mg/dL — ABNORMAL HIGH (ref 2.5–4.6)

## 2022-08-14 MED ORDER — OSMOLITE 1.5 CAL PO LIQD
1000.0000 mL | Freq: Once | ORAL | Status: AC
  Administered 2022-08-14: 1000 mL
  Filled 2022-08-14: qty 1000

## 2022-08-14 MED ORDER — APIXABAN 5 MG PO TABS
5.0000 mg | ORAL_TABLET | Freq: Two times a day (BID) | ORAL | Status: DC
  Administered 2022-08-14 – 2022-08-17 (×7): 5 mg
  Filled 2022-08-14 (×8): qty 1

## 2022-08-14 MED ORDER — FUROSEMIDE 10 MG/ML IJ SOLN
10.0000 mg/h | INTRAVENOUS | Status: DC
  Administered 2022-08-14 (×2): 4 mg/h via INTRAVENOUS
  Administered 2022-08-15: 8 mg/h via INTRAVENOUS
  Administered 2022-08-16 – 2022-08-17 (×2): 10 mg/h via INTRAVENOUS
  Filled 2022-08-14 (×6): qty 20

## 2022-08-14 MED ORDER — OSMOLITE 1.5 CAL PO LIQD
1000.0000 mL | ORAL | Status: DC
  Administered 2022-08-15 – 2022-08-17 (×4): 1000 mL
  Filled 2022-08-14 (×4): qty 1000

## 2022-08-14 MED ORDER — FENTANYL CITRATE PF 50 MCG/ML IJ SOSY
50.0000 ug | PREFILLED_SYRINGE | INTRAMUSCULAR | Status: DC | PRN
  Administered 2022-08-14 (×3): 50 ug via INTRAVENOUS
  Filled 2022-08-14 (×4): qty 1

## 2022-08-14 MED ORDER — PROSOURCE TF20 ENFIT COMPATIBL EN LIQD
60.0000 mL | Freq: Every day | ENTERAL | Status: DC
  Administered 2022-08-14 – 2022-08-17 (×4): 60 mL
  Filled 2022-08-14 (×4): qty 60

## 2022-08-14 MED ORDER — MIDAZOLAM HCL 2 MG/2ML IJ SOLN
1.0000 mg | INTRAMUSCULAR | Status: DC | PRN
  Administered 2022-08-15: 1 mg via INTRAVENOUS
  Filled 2022-08-14: qty 2

## 2022-08-14 MED ORDER — FUROSEMIDE 10 MG/ML IJ SOLN
120.0000 mg | Freq: Once | INTRAVENOUS | Status: AC
  Administered 2022-08-14: 120 mg via INTRAVENOUS
  Filled 2022-08-14: qty 10

## 2022-08-14 NOTE — Progress Notes (Signed)
ANTICOAGULATION CONSULT NOTE   Pharmacy Consult for heparin Indication:  atrial fibrillation (PTA Eliquis on hold)  Allergies  Allergen Reactions   Ciprofloxacin     Pt does not know reaction    Fosamax [Alendronate Sodium] Other (See Comments)    Upset stomach    Patient Measurements: Height: '5\' 7"'$  (170.2 cm) Weight: 71.6 kg (157 lb 13.6 oz) IBW/kg (Calculated) : 61.6 Heparin Dosing Weight: 71 kg  Vital Signs: Temp: 99.5 F (37.5 C) (11/08 0615) Temp Source: Bladder (11/08 0400) BP: 116/75 (11/08 0615) Pulse Rate: 82 (11/08 0615)  Labs: Recent Labs    08/12/22 0303 08/12/22 0919 08/12/22 1914 08/13/22 0419 08/13/22 1200 08/13/22 1452 08/13/22 2104 08/14/22 0520  HGB 11.8*  --   --  11.3*  --   --   --  10.8*  HCT 38.0  --   --  36.1  --   --   --  34.3*  PLT 189  --   --  196  --   --   --  201  APTT  --  45*   < > 72* 60*  --  82* 73*  HEPARINUNFRC  --  >1.10*  --  >1.10*  --   --   --  >1.10*  CREATININE 1.21*  --    < > 1.60*  --  1.38*  --  1.48*  TROPONINIHS  --   --   --   --   --   --   --  17   < > = values in this interval not displayed.     Estimated Creatinine Clearance: 24.6 mL/min (A) (by C-G formula based on SCr of 1.48 mg/dL (H)).   Medications:  - on Eliquis 5 mg bid PTA  Assessment: Patient's a 88 y.o F with hx afib on Eliquis PTA who presented to the ED on 08/11/2022 with c/o SOB and CP.  CXR on 08/20/2022 showed findings with concern for PNA. She was started on abx on admission for infection.  She became hypoxic on 08/12/22 and was intubated.  CXR on 08/12/22 showed progressive bilateral pleural effusions. Pharmacy was consulted on 08/12/2022 to transition Eliquis to heparin drip in anticipation for possible thoracentesis.  - last dose of Eliquis given to pt on 08/11/22 at 2133  Today, 08/14/2022: Confirmatory aPTT = 73 seconds remains therapeutic on heparin infusion of 1100 units/hr. HL >1.1 remains falsely elevated due to recent DOAC CBC: Hgb  (10.8) low but stable; Plt WNL & stable Confirmed with RN - no line issues or bleeding SCr remains elevated (CrCl ~25 mL/min)  Goal of Therapy:  Heparin level 0.3-0.7 units/ml aPTT 66-102 seconds Monitor platelets by anticoagulation protocol: Yes   Plan:  Continue heparin infusion at 1100 units/hr Check HL/aPTT, CBC with AM labs tomorrow. Continue to monitor using aPTT at this time Monitor for signs of bleeding  If heparin drip will need to be held for thoracentesis, will need input on when to hold prior to procedure.   Lenis Noon, PharmD 08/14/2022,7:37 AM

## 2022-08-14 NOTE — Progress Notes (Addendum)
Nutrition Follow-up  DOCUMENTATION CODES:  Non-severe (moderate) malnutrition in context of acute illness/injury  INTERVENTION:  -Initiate Osmolite 1.5 @ 74m/hr w/ 67mProsource TF20 q day. As tolerated, increase EN by 1064m 24hrs to goal rate. -Goal rate: Osmolite 1.5 @ 25m46m x 19hrs (855mL48mal volume) with 60mL 23mource TF 20 q day to provide 1363kcal, 74g protein and 652mL f33mwater. -EN to run for 19 hours due to morning dose of synthroid. -Please check lytes q 12-24hrs and replete prn.  NUTRITION DIAGNOSIS:  Moderate Malnutrition related to acute illness as evidenced by energy intake < 75% for > 7 days, mild fat depletion, moderate muscle depletion, edema. ongoing  GOAL:  Patient will meet greater than or equal to 90% of their needs  MONITOR:  Vent status  REASON FOR ASSESSMENT:  Consult and f/u for vent Enteral/tube feeding initiation and management  ASSESSMENT:  Pt is a 88yo F 88yo PMH of CVA, hypothyroidism, GERD, HLD and a-fib who presents with SOB, productive cough, and pain involving her chest and back. Requiring intubation on 11/6 for persistent hypoxia.  Pt continues to have daily weaning trials but unable to extubate today. Pt is day 5 inadequate nutrition during admission. Spoke with MD about initiating EN, he is agreeable. Pt receives morning dose of synthroid, therefore EN can only run over 19hrs. EN should be held 4 hours before synthroid and 0.5-1hr after synthroid. Pt remains on stable dose of levophed with MAP maintaining >65. Pt is 9.3L positive during admission, started on lasix drip. Due to fluid retention will use higher calorie formula to reduce volume.  Recommend initiating Osmolite 1.5 @ 20mL/hr68mProsource TF20 q day. As tolerated, increase EN by 10mL q 247m to goal rate of 25mL/hr x54mrs (855mL total36mume) to provide 1363kcal, 74g protein and 652mL free w71m. Monitor lytes q 12-24hrs and replete prn.   Labs reviewed: BG:135-150, Na:132,  BUN:33, Cr:1.6, GFR:30, BNP:636.2, triglycerides:63  Diet Order:   Diet Order             Diet NPO time specified  Diet effective now                   EDUCATION NEEDS:  Education needs have been addressed  Skin:  Skin Assessment: Reviewed RN Assessment  Last BM:  PTA >5 days  Height:  Ht Readings from Last 1 Encounters:  08/12/22 '5\' 7"'$  (1.702 m)   Weight:  Wt Readings from Last 1 Encounters:  08/22/2022 71.6 kg    BMI:  Body mass index is 24.72 kg/m.  Estimated Nutritional Needs:  Kcal:  1300-1575kcal ([employer]: 1423kcal) Protein:  70-100g (1.0-1.4g/kg due to kidney function) Fluid:  1650mL (23mL/k75me t44muid retention)  Katie Pj Zehner,Candise Bowens CNSC See AMiON for contact information

## 2022-08-14 NOTE — Progress Notes (Signed)
NAME:  Patricia [name], MRN:  [id number], DOB:  January 31, 1930, LOS: 5 ADMISSION DATE:  09/03/2022, CONSULTATION DATE:  08/10/22 REFERRING MD:  Dr. Tawanna Solo, [facility], CHIEF COMPLAINT:  Respiratory distress   History of Present Illness:  88 yo female presented to [facility] ER with chest pain, dyspnea, back pain, cough with sputum, subjective fever (Tm 100.3 F).  SpO2 in 80's on room air in ER.  CXR showed Rt infrahilar ASD and she was started on ABx for community acquired pneumonia.  She developed increased respiratory distress, hypoxia with increased O2 needs, A fib with RVR and hypotension on 08/10/22.  She was transferred to ICU and PCCM consulted to assist with ICU management.  Pertinent  Medical History  CVA, Hypothyroidism, GERD, A fib on eliquis, Depression, Bladder incontinence, HLD  Significant Hospital Events: Including procedures, antibiotic start and stop dates in addition to other pertinent events   11/03 Admit 11/04 Transfer to ICU, cardiology consulted, started amiodarone gtt 11/6 RVP positive for rhinovirus  Interim History / Subjective:  Failed SBT yesterday due to low volumes. Attempted to try PO Amio yesterday but had some SVT/VT; therefore, changed back to IV after additional loading dose. This morning awakens to voice on 75 Fentanyl, slow to respond. [place].  Objective   Blood pressure 116/75, pulse 82, temperature 99.5 F (37.5 C), resp. rate 15, height '5\' 7"'$  (1.702 m), weight 71.6 kg, SpO2 96 %.    Vent Mode: PRVC FiO2 (%):  [40 %-50 %] 50 % Set Rate:  [16 bmp] 16 bmp Vt Set:  [430 mL] 430 mL PEEP:  [8 cmH20] 8 cmH20 Plateau Pressure:  [16 cmH20-25 cmH20] 19 cmH20   Intake/Output Summary (Last 24 hours) at 08/14/2022 0801 Last data filed at 08/14/2022 0600 Gross per 24 hour  Intake 1795.16 ml  Output 670 ml  Net 1125.16 ml    Filed Weights   08/08/2022 1359  Weight: 71.6 kg    Examination: General: Elderly female, resting in bed, in NAD. Neuro: Sedated,  opens eyes to voice but not following commands. HEENT: [place]/AT. Sclerae anicteric. ETT in place. Cardiovascular: IRIR, no M/R/G.  Lungs: Respirations even and unlabored.  CTA bilaterally, No W/R/R. Abdomen: BS x 4, soft, NT/ND.  Musculoskeletal: No gross deformities, edema.  Skin: Intact, warm, no rashes.   Assessment & Plan:   Acute hypoxic respiratory failure from community acquired pneumonia - s/p intubation overnight 11/5. - full vent support. - Re-attempt SBT this AM after sedation weaned further (had low volumes yesterday). - bronchial hygiene. - Stop Azithromycin, Ceftriaxone today. Cultures remain negative.  Bilateral pleural effusions and volume overload - slightly improved. - Hold off on thoracentesis, OK to resume DOAC.  A fib with RVR. Hx of HLD. - cardiology following, appreciate the assistance. - Continue to hold Lopressor given hypotension. - continue amiodarone gtt, attempted to switch to PO 11/7 but had SVT/VT. - OK to transition back to [place] as not planning for thoracentesis. - continue lipitor.  Hypotension - presumed 2/2 above + vent + sedation. - Continue Levophed. - Limit sedation as able, have asked RN to wean fentanyl as part of WUA.  AKI - slightly worse with diuresis. - Hold further diuresis today. - Follow BMP.  Hx of depression. - Hold home zoloft.  Hypothyroidism. - continue synthroid.  Deconditioning. - PT/OT when able.    Best Practice (right click and "Reselect all SmartList Selections" daily)   Diet/type: NPO, anticipate extubation today DVT prophylaxis: systemic heparin (in place of  home Apixaban) GI prophylaxis: H2B Lines: N/A Foley:  N/A Code Status:  full code Last date of multidisciplinary goals of care discussion [updated pt's family at bedside]  CC time: 30 min.   Montey Hora, [facility] For pager details, please see AMION or use Epic chat  After 1900, please call [facility] for  cross coverage needs 08/14/2022, 8:01 AM

## 2022-08-14 NOTE — Progress Notes (Signed)
Rounding Note    Patient Name: Patricia [name] Date of Encounter: 08/14/2022  [place] Cardiologist: Dr. Marlou Porch   Subjective   Remains intubated and sedated. Family at bedside. Discussed echo findings, recommendations for next steps, see below.  Inpatient Medications    Scheduled Meds:  apixaban  5 mg Per Tube BID   atorvastatin  10 mg Per Tube Daily   Chlorhexidine Gluconate Cloth  6 each Topical Daily   famotidine  20 mg Per Tube Daily   fentaNYL (SUBLIMAZE) injection  25 mcg Intravenous Once   levothyroxine  88 mcg Per Tube Q0600   mouth rinse  15 mL Mouth Rinse Q2H   sodium chloride flush  3 mL Intravenous Q12H   Continuous Infusions:  sodium chloride     amiodarone 30 mg/hr (08/14/22 1023)   fentaNYL infusion INTRAVENOUS 75 mcg/hr (08/14/22 0817)   furosemide     furosemide (LASIX) 200 mg in dextrose 5 % 100 mL (2 mg/mL) infusion     norepinephrine (LEVOPHED) Adult infusion 9 mcg/min (08/14/22 0944)   PRN Meds: acetaminophen **OR** acetaminophen, albuterol, fentaNYL, ondansetron (ZOFRAN) IV, mouth rinse, polyethylene glycol   Vital Signs    Vitals:   08/14/22 1030 08/14/22 1045 08/14/22 1100 08/14/22 1115  BP: 102/65 114/61 106/61 120/64  Pulse: 76 75 68 87  Resp: '16 17 17 16  '$ Temp: 99.3 F (37.4 C) 99.3 F (37.4 C) 99.3 F (37.4 C) 99.3 F (37.4 C)  TempSrc:      SpO2: 94% 95% 95% 95%  Weight:      Height:        Intake/Output Summary (Last 24 hours) at 08/14/2022 1125 Last data filed at 08/14/2022 1000 Gross per 24 hour  Intake 1633.86 ml  Output 650 ml  Net 983.86 ml      09/05/2022    1:59 PM 07/31/2022    2:16 PM 07/12/2022    4:02 PM  Last 3 Weights  Weight (lbs) 157 lb 13.6 oz 156 lb 157 lb  Weight (kg) 71.6 kg 70.761 kg 71.215 kg      Telemetry    Atrial fibrillation, rate controlled. Yesterday had several runs of NSVT when trialed off IV amiodarone - Personally Reviewed  ECG    No new - Personally  Reviewed  Physical Exam   GEN: intubated and sedated NECK: No JVD appreciated CARDIAC: irregularly irregular rhythm, normal S1 and S2, no rubs or gallops. No murmur. VASCULAR: Radial pulses 2+ bilaterally.  RESPIRATORY:  ventilated breath sounds, coarse throughout ABDOMEN: Soft, non-tender, non-distended MUSCULOSKELETAL:  normal bulk SKIN: Warm and dry, minimal LE edema NEUROLOGIC/PSYCHIATRIC:  intubated and sedated   Labs    High Sensitivity Troponin:   Recent Labs  Lab 08/21/2022 0147 08/11/2022 0408 08/14/22 0520  TROPONINIHS '14 15 17     '$ Chemistry Recent Labs  Lab 08/24/2022 0147 08/11/2022 0226 08/12/22 0303 08/12/22 1914 08/13/22 0419 08/13/22 1452 08/14/22 0520  NA 137   < > 134*   < > 132* 137 132*  K 3.0*   < > 4.1   < > 3.9 3.9 3.7  CL 103   < > 103   < > 102 99 98  CO2 22   < > 19*   < > 22 21* 22  GLUCOSE 165*   < > 162*   < > 150* 139* 136*  BUN 18   < > 28*   < > 33* 36* 37*  CREATININE 0.75   < >  1.21*   < > 1.60* 1.38* 1.48*  CALCIUM 8.9   < > 8.9   < > 8.2* 8.8* 8.3*  MG  --    < > 1.9  --  1.7  --  2.1  PROT 7.9  --   --   --   --  6.2*  --   ALBUMIN 3.7  --   --   --   --  2.8*  --   AST 26  --   --   --   --  243*  --   ALT 20  --   --   --   --  437*  --   ALKPHOS 61  --   --   --   --  100  --   BILITOT 0.8  --   --   --   --  0.6  --   GFRNONAA >60   < > 43*   < > 30* 36* 33*  ANIONGAP 12   < > 12   < > 8 17* 12   < > = values in this interval not displayed.    Lipids  Recent Labs  Lab 08/13/22 0419  TRIG 63    Hematology Recent Labs  Lab 08/12/22 0303 08/13/22 0419 08/14/22 0520  WBC 11.7* 10.0 8.9  RBC 3.99 3.82* 3.71*  HGB 11.8* 11.3* 10.8*  HCT 38.0 36.1 34.3*  MCV 95.2 94.5 92.5  MCH 29.6 29.6 29.1  MCHC 31.1 31.3 31.5  RDW 15.0 15.2 15.3  PLT 189 196 201   Thyroid No results for input(s): "TSH", "FREET4" in the last 168 hours.  BNP Recent Labs  Lab 09/05/2022 0147 08/13/22 0419 08/14/22 0520  BNP 418.6* 636.2* 512.1*     DDimer No results for input(s): "DDIMER" in the last 168 hours.   Radiology    DG Chest Port 1 View  Result Date: 08/14/2022 CLINICAL DATA:  Provided history: Respiratory failure. EXAM: PORTABLE CHEST 1 VIEW COMPARISON:  Prior chest radiographs 08/13/2022 and earlier. FINDINGS: ET tube unchanged in position, terminating just above the level of the clavicular heads. Partially imaged enteric tube, which passes below the level of the left hemidiaphragm and terminates outside of the field of view. The cardiomediastinal silhouette is unchanged. Aortic atherosclerosis. Similar appearance of a layering pleural effusion at the right lung base. Persistent small left pleural effusion. Persistent underlying bibasilar atelectasis and/or consolidation, although aeration at the left lung base has improved. New ill-defined opacity within the right upper lobe, which may reflect atelectasis or pneumonia. No acute bony abnormality identified. IMPRESSION: Support apparatus, as described. New ill-defined opacity within the right upper lobe, which may reflect atelectasis or pneumonia. Persistent bilateral pleural effusions with underlying bibasilar atelectasis and/or consolidation. Aeration of the left lung base has improved from the prior chest radiograph of 08/13/2022. Aortic Atherosclerosis (ICD10-I70.0). Electronically Signed   By: Patricia [name] D.O.   On: 08/14/2022 08:32   ECHOCARDIOGRAM COMPLETE  Result Date: 08/13/2022    ECHOCARDIOGRAM REPORT   Patient Name:   Patricia [name] Date of Exam: 08/13/2022 Medical Rec #:  [id number]     Height:       67.0 in Accession #:    [id number]    Weight:       157.8 lb Date of Birth:  03-30-1930     BSA:          1.829 m Patient Age:    88 years  BP:           119/75 mmHg Patient Gender: F             HR:           91 bpm. Exam Location:  Inpatient Procedure: 2D Echo, 3D Echo, Cardiac Doppler and Color Doppler Indications:    R06.02 SOB  History:        Patient has prior history  of Echocardiogram examinations, most                 recent 01/16/2021. Abnormal ECG, Stroke, Arrythmias:Atrial                 Fibrillation; Signs/Symptoms:Hypotension and Syncope.  Sonographer:    Patricia [name] RDCS Referring Phys: [id number] [facility]  Sonographer Comments: Technically difficult study due to poor echo windows and echo performed with patient supine and on artificial respirator. IMPRESSIONS  1. Left ventricular ejection fraction, by estimation, is 30 to 35%. The left ventricle has moderately decreased function. The left ventricle demonstrates global hypokinesis. There is mild left ventricular hypertrophy of the basal-septal segment. Left ventricular diastolic function could not be evaluated. There is the interventricular septum is flattened in diastole ('D' shaped left ventricle), consistent with right ventricular volume overload.  2. Right ventricular systolic function is moderately reduced. The right ventricular size is moderately enlarged. There is moderately elevated pulmonary artery systolic pressure. The estimated right ventricular systolic pressure is 35.4 mmHg.  3. Left atrial size was moderately dilated.  4. Right atrial size was moderately dilated.  5. The mitral valve is normal in structure. Mild to moderate mitral valve regurgitation.  6. The tricuspid valve is abnormal. Tricuspid valve regurgitation is severe.  7. The aortic valve is tricuspid. There is mild thickening of the aortic valve. Aortic valve regurgitation is not visualized. Aortic valve sclerosis is present, with no evidence of aortic valve stenosis.  8. The inferior vena cava is dilated in size with <50% respiratory variability, suggesting right atrial pressure of 15 mmHg. Comparison(s): A prior study was performed on 01/16/2021. Prior images reviewed side by side. The left ventricular function is worsened. The right ventricular systolic function is worse. Tricuspid insufficiency is now severe. FINDINGS  Left Ventricle: Left  ventricular ejection fraction, by estimation, is 30 to 35%. The left ventricle has moderately decreased function. The left ventricle demonstrates global hypokinesis. 3D left ventricular ejection fraction analysis performed but not reported based on interpreter judgement due to suboptimal tracking. The left ventricular internal cavity size was normal in size. There is mild left ventricular hypertrophy of the basal-septal segment. The interventricular septum is flattened in diastole  ('D' shaped left ventricle), consistent with right ventricular volume overload. Left ventricular diastolic function could not be evaluated due to atrial fibrillation. Left ventricular diastolic function could not be evaluated. Right Ventricle: The right ventricular size is moderately enlarged. No increase in right ventricular wall thickness. Right ventricular systolic function is moderately reduced. There is moderately elevated pulmonary artery systolic pressure. The tricuspid  regurgitant velocity is 2.99 m/s, and with an assumed right atrial pressure of 15 mmHg, the estimated right ventricular systolic pressure is 56.2 mmHg. Left Atrium: Left atrial size was moderately dilated. Right Atrium: Right atrial size was moderately dilated. Pericardium: There is no evidence of pericardial effusion. Mitral Valve: The mitral valve is normal in structure. Mild to moderate mitral valve regurgitation, with centrally-directed jet. Tricuspid Valve: The tricuspid valve is abnormal. Tricuspid valve regurgitation is severe. Aortic Valve: The aortic  valve is tricuspid. There is mild thickening of the aortic valve. Aortic valve regurgitation is not visualized. Aortic valve sclerosis is present, with no evidence of aortic valve stenosis. Pulmonic Valve: The pulmonic valve was grossly normal. Pulmonic valve regurgitation is not visualized. Aorta: The aortic root and ascending aorta are structurally normal, with no evidence of dilitation. Venous: The inferior  vena cava is dilated in size with less than 50% respiratory variability, suggesting right atrial pressure of 15 mmHg. The inferior vena cava and the hepatic vein show a pattern of systolic flow reversal, suggestive of tricuspid regurgitation. IAS/Shunts: No atrial level shunt detected by color flow Doppler.  LEFT VENTRICLE PLAX 2D LVIDd:         4.60 cm LVIDs:         3.70 cm LV PW:         0.90 cm LV IVS:        1.40 cm LVOT diam:     2.30 cm      3D Volume EF: LV SV:         45           3D EF:        43 % LV SV Index:   25           LV EDV:       115 ml LVOT Area:     4.15 cm     LV ESV:       66 ml                             LV SV:        49 ml  LV Volumes (MOD) LV vol d, MOD A2C: 117.0 ml LV vol d, MOD A4C: 93.8 ml LV vol s, MOD A2C: 93.4 ml LV vol s, MOD A4C: 59.1 ml LV SV MOD A2C:     23.6 ml LV SV MOD A4C:     93.8 ml LV SV MOD BP:      29.1 ml RIGHT VENTRICLE            IVC RV S prime:     7.18 cm/s  IVC diam: 3.10 cm TAPSE (M-mode): 1.3 cm LEFT ATRIUM             Index        RIGHT ATRIUM           Index LA diam:        4.60 cm 2.52 cm/m   RA Area:     22.90 cm LA Vol (A2C):   63.9 ml 34.94 ml/m  RA Volume:   65.60 ml  35.87 ml/m LA Vol (A4C):   67.0 ml 36.64 ml/m LA Biplane Vol: 68.9 ml 37.68 ml/m  AORTIC VALVE LVOT Vmax:   77.52 cm/s LVOT Vmean:  48.700 cm/s LVOT VTI:    0.108 m  AORTA Ao Root diam: 3.60 cm Ao Asc diam:  3.60 cm MITRAL VALVE                  TRICUSPID VALVE MV Area (PHT): 4.49 cm       TR Peak grad:   35.8 mmHg MV Decel Time: 169 msec       TR Vmax:        299.00 cm/s MR Peak grad:    76.7 mmHg MR Mean grad:    45.0 mmHg    SHUNTS MR Vmax:  438.00 cm/s  Systemic VTI:  0.11 m MR Vmean:        305.0 cm/s   Systemic Diam: 2.30 cm MR PISA:         1.01 cm MR PISA Eff ROA: 9 mm MR PISA Radius:  0.40 cm MV E velocity: 94.87 cm/s Dani Gobble Croitoru MD Electronically signed by Sanda Klein MD Signature Date/Time: 08/13/2022/12:51:08 PM    Final    DG Chest Port 1 View  Result  Date: 08/13/2022 CLINICAL DATA:  88 year old female with respiratory failure. Intubated. EXAM: PORTABLE CHEST 1 VIEW COMPARISON:  Portable chest 08/12/2022 and earlier. FINDINGS: Portable AP semi upright view at 0411 hours. Stable endotracheal tube tip just above the clavicles. Enteric tube courses to the abdomen, tip not included. More rotated to the left today. Ongoing bibasilar veiling opacity, dense retrocardiac opacity. No pneumothorax or air bronchograms. Pulmonary vascularity appears stable to decreased. No overt edema. Stable visualized osseous structures. IMPRESSION: 1.  Stable lines and tubes. 2. Stable ventilation with bilateral pleural effusions and lower lobe collapse or consolidation. Electronically Signed   By: Genevie Ann M.D.   On: 08/13/2022 08:32   DG Chest Port 1 View  Result Date: 08/12/2022 CLINICAL DATA:  Central line EXAM: PORTABLE CHEST 1 VIEW COMPARISON:  08/12/2022, 09/02/2022 FINDINGS: Endotracheal tube tip is about 4.7 cm superior to the carina. Esophageal tube tip is below the diaphragm but incompletely visualized. Cardiomegaly with vascular congestion and moderate pleural effusions. Bibasilar airspace disease. Aortic atherosclerosis. New left-sided central venous catheter, the tip is directed towards the patient's left in the axillary region. No visible left pneumothorax IMPRESSION: 1. New left-sided central venous catheter, the tip is directed towards the patient's left in the axillary region. No visible left pneumothorax. 2. Cardiomegaly with vascular congestion and moderate bilateral pleural effusions. Bibasilar airspace disease may be due to atelectasis or pneumonia. These findings do not appear significantly changed. These results will be called to the ordering clinician or representative by the Radiologist Assistant, and communication documented in the PACS or [employer]. Electronically Signed   By: Donavan Foil M.D.   On: 08/12/2022 19:27    Cardiac Studies   Echo  08/13/22  1. Left ventricular ejection fraction, by estimation, is 30 to 35%. The  left ventricle has moderately decreased function. The left ventricle  demonstrates global hypokinesis. There is mild left ventricular  hypertrophy of the basal-septal segment. Left  ventricular diastolic function could not be evaluated. There is the  interventricular septum is flattened in diastole ('D' shaped left  ventricle), consistent with right ventricular volume overload.   2. Right ventricular systolic function is moderately reduced. The right  ventricular size is moderately enlarged. There is moderately elevated  pulmonary artery systolic pressure. The estimated right ventricular  systolic pressure is 27.7 mmHg.   3. Left atrial size was moderately dilated.   4. Right atrial size was moderately dilated.   5. The mitral valve is normal in structure. Mild to moderate mitral valve  regurgitation.   6. The tricuspid valve is abnormal. Tricuspid valve regurgitation is  severe.   7. The aortic valve is tricuspid. There is mild thickening of the aortic  valve. Aortic valve regurgitation is not visualized. Aortic valve  sclerosis is present, with no evidence of aortic valve stenosis.   8. The inferior vena cava is dilated in size with <50% respiratory  variability, suggesting right atrial pressure of 15 mmHg.   Comparison(s): A prior study was performed on 01/16/2021. Prior images  reviewed side by side. The left ventricular function is worsened. The  right ventricular systolic function is worse. Tricuspid insufficiency is  now severe.   Patient Profile     88 y.o. female with a hx of prior CVA, hypothyroidism, GERD and known Afib on apixaban who presented with sepsis secondary to pneumonia with course complicated by Afib with RVR for which Cardiology has been consulted.   Assessment & Plan    #Cardiomyopathy, new Acute systolic and diastolic heart failure Right sided heart failure -EF 30-35%,  severe TR, elevated right sided pressures -no blood pressure room for GDMT given pressors -recommend diuresis. Did not have much output to 60 mg IV times several doses. Will start lasix drip to minimize blood pressure fluctuations and aim for continuous diuresis -cardiomyopathy may be stress induced, will need to re-evaluate as an outpatient. Given her current comorbidities, do not anticipate inpatient cath at this time. -monitor renal function, electrolytes -restart beta blockers when blood pressure allows -warm on exam, do not suspect cardiogenic shock. With right sided heart failure, milrinone may be needed if lasix alone doesn't result in good urine output  #Afib with RVR: unknown duration, suspect paroxysmal -suspect driven by respiratory infection -rates variable -CHADs-vasc 6 -Continue amiodarone, attempted transition to oral 11/7 but had significant ectopy. Returned to IV amiodarone with improvement -convert to oral DOAC (was on apixaban 5 mg bid) given no plans for thoracentesis -blood pressure low, beta blocker stopped given need for pressors -Management of pneumonia per primary team   #Sepsis secondary to Pneumonia #Acute Hypoxic Respiratory Failure #Pleural effusion -intubated due to persistent hypoxia -no plan for thoracentesis at this time -management per primary team -listed as net positive 9 L, minimal urine output charted. Diuresis as above.   #History of CVA: -anticoagulation as above -continue statin  Per family, patient wished for full code/aggressive medical management. Asking about prognosis. From a cardiac standpoint, I suspect that the cardiomyopathy may be due to the lung infection, in which case it may improve once the lungs improve. However, given her age and comorbidities, she is at increased risk of poor outcomes compared to the general population.   High risk medical condition with high complexity medical decision making.  For questions or updates,  please contact [facility] Please consult [email or web address] for contact info under     Signed, Buford Dresser, MD  08/14/2022, 11:25 AM

## 2022-08-15 ENCOUNTER — Ambulatory Visit: Payer: Medicare PPO | Admitting: Cardiology

## 2022-08-15 ENCOUNTER — Inpatient Hospital Stay: Payer: Self-pay

## 2022-08-15 ENCOUNTER — Inpatient Hospital Stay (HOSPITAL_COMMUNITY): Payer: Medicare PPO

## 2022-08-15 DIAGNOSIS — I480 Paroxysmal atrial fibrillation: Secondary | ICD-10-CM

## 2022-08-15 DIAGNOSIS — I5041 Acute combined systolic (congestive) and diastolic (congestive) heart failure: Secondary | ICD-10-CM | POA: Diagnosis not present

## 2022-08-15 DIAGNOSIS — A419 Sepsis, unspecified organism: Secondary | ICD-10-CM | POA: Diagnosis not present

## 2022-08-15 DIAGNOSIS — I9589 Other hypotension: Secondary | ICD-10-CM

## 2022-08-15 DIAGNOSIS — J189 Pneumonia, unspecified organism: Secondary | ICD-10-CM | POA: Diagnosis not present

## 2022-08-15 DIAGNOSIS — R570 Cardiogenic shock: Secondary | ICD-10-CM

## 2022-08-15 DIAGNOSIS — J9601 Acute respiratory failure with hypoxia: Secondary | ICD-10-CM | POA: Diagnosis not present

## 2022-08-15 LAB — COOXEMETRY PANEL
Carboxyhemoglobin: 1.5 % (ref 0.5–1.5)
Methemoglobin: <0.7 % (ref 0.0–1.5)
O2 Saturation: 60.7 %
Total hemoglobin: 11 g/dL — ABNORMAL LOW (ref 12.0–16.0)

## 2022-08-15 LAB — MAGNESIUM: Magnesium: 1.9 mg/dL (ref 1.7–2.4)

## 2022-08-15 LAB — CBC
HCT: 32.4 % — ABNORMAL LOW (ref 36.0–46.0)
Hemoglobin: 10.6 g/dL — ABNORMAL LOW (ref 12.0–15.0)
MCH: 29.4 pg (ref 26.0–34.0)
MCHC: 32.7 g/dL (ref 30.0–36.0)
MCV: 89.8 fL (ref 80.0–100.0)
Platelets: 192 10*3/uL (ref 150–400)
RBC: 3.61 MIL/uL — ABNORMAL LOW (ref 3.87–5.11)
RDW: 15.2 % (ref 11.5–15.5)
WBC: 7.9 10*3/uL (ref 4.0–10.5)
nRBC: 0 % (ref 0.0–0.2)

## 2022-08-15 LAB — HEPATIC FUNCTION PANEL
ALT: 243 U/L — ABNORMAL HIGH (ref 0–44)
AST: 73 U/L — ABNORMAL HIGH (ref 15–41)
Albumin: 2.4 g/dL — ABNORMAL LOW (ref 3.5–5.0)
Alkaline Phosphatase: 100 U/L (ref 38–126)
Bilirubin, Direct: 0.2 mg/dL (ref 0.0–0.2)
Indirect Bilirubin: 0.3 mg/dL (ref 0.3–0.9)
Total Bilirubin: 0.5 mg/dL (ref 0.3–1.2)
Total Protein: 6.4 g/dL — ABNORMAL LOW (ref 6.5–8.1)

## 2022-08-15 LAB — BASIC METABOLIC PANEL
Anion gap: 12 (ref 5–15)
BUN: 46 mg/dL — ABNORMAL HIGH (ref 8–23)
CO2: 22 mmol/L (ref 22–32)
Calcium: 7.9 mg/dL — ABNORMAL LOW (ref 8.9–10.3)
Chloride: 97 mmol/L — ABNORMAL LOW (ref 98–111)
Creatinine, Ser: 1.86 mg/dL — ABNORMAL HIGH (ref 0.44–1.00)
GFR, Estimated: 25 mL/min — ABNORMAL LOW (ref >60)
Glucose, Bld: 124 mg/dL — ABNORMAL HIGH (ref 70–99)
Potassium: 3.1 mmol/L — ABNORMAL LOW (ref 3.5–5.1)
Sodium: 131 mmol/L — ABNORMAL LOW (ref 135–145)

## 2022-08-15 LAB — GLUCOSE, CAPILLARY
Glucose-Capillary: 104 mg/dL — ABNORMAL HIGH (ref 70–99)
Glucose-Capillary: 124 mg/dL — ABNORMAL HIGH (ref 70–99)
Glucose-Capillary: 130 mg/dL — ABNORMAL HIGH (ref 70–99)
Glucose-Capillary: 139 mg/dL — ABNORMAL HIGH (ref 70–99)

## 2022-08-15 LAB — TSH: TSH: 3.321 u[IU]/mL (ref 0.350–4.500)

## 2022-08-15 LAB — BRAIN NATRIURETIC PEPTIDE: B Natriuretic Peptide: 722.4 pg/mL — ABNORMAL HIGH (ref 0.0–100.0)

## 2022-08-15 LAB — PHOSPHORUS: Phosphorus: 4.2 mg/dL (ref 2.5–4.6)

## 2022-08-15 MED ORDER — POTASSIUM CHLORIDE 10 MEQ/50ML IV SOLN
10.0000 meq | INTRAVENOUS | Status: AC
  Administered 2022-08-15 (×5): 10 meq via INTRAVENOUS
  Filled 2022-08-15 (×4): qty 50

## 2022-08-15 MED ORDER — MIDAZOLAM HCL 2 MG/2ML IJ SOLN
1.0000 mg | INTRAMUSCULAR | Status: DC | PRN
  Administered 2022-08-18: 1 mg via INTRAVENOUS
  Filled 2022-08-15: qty 2

## 2022-08-15 MED ORDER — ALBUMIN HUMAN 25 % IV SOLN
12.5000 g | Freq: Once | INTRAVENOUS | Status: DC
  Filled 2022-08-15: qty 50

## 2022-08-15 MED ORDER — OXYCODONE HCL 5 MG PO TABS
5.0000 mg | ORAL_TABLET | Freq: Four times a day (QID) | ORAL | Status: DC
  Administered 2022-08-15 – 2022-08-17 (×10): 5 mg
  Filled 2022-08-15 (×10): qty 1

## 2022-08-15 MED ORDER — MAGNESIUM SULFATE 2 GM/50ML IV SOLN
2.0000 g | Freq: Once | INTRAVENOUS | Status: AC
  Administered 2022-08-15: 2 g via INTRAVENOUS
  Filled 2022-08-15: qty 50

## 2022-08-15 MED ORDER — POTASSIUM CHLORIDE 20 MEQ PO PACK
20.0000 meq | PACK | ORAL | Status: AC
  Administered 2022-08-15 (×2): 20 meq
  Filled 2022-08-15 (×2): qty 1

## 2022-08-15 MED ORDER — DEXMEDETOMIDINE HCL IN NACL 200 MCG/50ML IV SOLN
0.4000 ug/kg/h | INTRAVENOUS | Status: DC
  Administered 2022-08-15: 0.4 ug/kg/h via INTRAVENOUS
  Filled 2022-08-15 (×2): qty 50

## 2022-08-15 MED ORDER — ALBUMIN HUMAN 25 % IV SOLN
12.5000 g | Freq: Four times a day (QID) | INTRAVENOUS | Status: AC
  Administered 2022-08-15 (×2): 12.5 g via INTRAVENOUS
  Filled 2022-08-15: qty 50

## 2022-08-15 MED ORDER — GUAIFENESIN 100 MG/5ML PO LIQD
15.0000 mL | Freq: Four times a day (QID) | ORAL | Status: DC
  Administered 2022-08-15 – 2022-08-17 (×10): 15 mL
  Filled 2022-08-15 (×11): qty 20

## 2022-08-15 MED ORDER — FUROSEMIDE 10 MG/ML IJ SOLN
120.0000 mg | Freq: Once | INTRAVENOUS | Status: AC
  Administered 2022-08-15: 120 mg via INTRAVENOUS
  Filled 2022-08-15: qty 10

## 2022-08-15 NOTE — Progress Notes (Signed)
[facility] ADULT ICU REPLACEMENT PROTOCOL   The patient does apply for the [facility] - Inpatient Adult ICU Electrolyte Replacment Protocol based on the criteria listed below:   1.Exclusion criteria: TCTS, ECMO, Dialysis, and Myasthenia Gravis patients 2. Is GFR >/= 30 ml/min? No  Patient's GFR today is 25 3. Is SCr </= 2? Yes.   Patient's SCr is 1.86 mg/dL 4. Did SCr increase >/= 0.5 in 24 hours? Yes.   5.Pt's weight >40kg  Yes.   6. Abnormal electrolyte(s): K+ 3.1  7. Electrolytes replaced per protocol 8.  Call MD STAT for K+ </= 2.5, Phos </= 1, or Mag </= 1 Physician:  Dr. Patrcia Dolly 08/15/2022 5:51 AM

## 2022-08-15 NOTE — Progress Notes (Addendum)
NAME:  Patricia [name], MRN:  [id number], DOB:  12/13/1929, LOS: 6 ADMISSION DATE:  08/17/2022, CONSULTATION DATE:  08/10/22 REFERRING MD:  Dr. Tawanna Solo, [facility], CHIEF COMPLAINT:  Respiratory distress   History of Present Illness:  88 yo female presented to [facility] ER with chest pain, dyspnea, back pain, cough with sputum, subjective fever (Tm 100.3 F).  SpO2 in 80's on room air in ER.  CXR showed Rt infrahilar ASD and she was started on ABx for community acquired pneumonia.  She developed increased respiratory distress, hypoxia with increased O2 needs, A fib with RVR and hypotension on 08/10/22.  She was transferred to ICU and PCCM consulted to assist with ICU management.  Pertinent  Medical History  CVA, Hypothyroidism, GERD, A fib on eliquis, Depression, Bladder incontinence, HLD  Significant Hospital Events: Including procedures, antibiotic start and stop dates in addition to other pertinent events   11/03 Admit 11/04 Transfer to ICU, cardiology consulted, started amiodarone gtt 11/6 RVP positive for rhinovirus 11/8 failed SBT low TV, failed transition to PO amio> SVT/ VT back on IV, 675 UOP with diureses> lasix increased, gtt added   Interim History / Subjective:  NE down from 9> 3 Diuresed 1.8L, remains net +8.9L Weaning PSV10/5 with good volumes, became anxious> had to be placed back on fentanyl gtt> now sedated  Tmax 100.3  Objective   Blood pressure 107/62, pulse 83, temperature 99.1 F (37.3 C), resp. rate 15, height '5\' 7"'$  (1.702 m), weight 71.6 kg, SpO2 97 %.    Vent Mode: CPAP;PSV FiO2 (%):  [35 %-50 %] 35 % Set Rate:  [16 bmp] 16 bmp Vt Set:  [430 mL] 430 mL PEEP:  [5 cmH20-8 cmH20] 5 cmH20 Pressure Support:  [10 cmH20] 10 cmH20 Plateau Pressure:  [11 cmH20-21 cmH20] 20 cmH20   Intake/Output Summary (Last 24 hours) at 08/15/2022 2440 Last data filed at 08/15/2022 0700 Gross per 24 hour  Intake 1513.22 ml  Output 1855 ml  Net -341.78 ml   Filed Weights   08/11/2022 1359   Weight: 71.6 kg    Examination: General:  elderly female in NAD lying in bed HEENT: MM pink/moist, ETT/ OGT, pupils 3/reactive, +JVP Neuro: sedated, does not f/c, grimaces CV: irir, +murmur LSB PULM:  non labored on MV, clear, no wheeze, small amount of thick yellowish secretions GI: soft, bs+, ND, foley  Extremities: warm/dry, LE 2+ edema   Tmax 100 CBG range 154> 104 Na 131, K 3.1, CL 97, BUN 46, sCr 1.48> 1.86, Mag 1.9, Phos 4.2, AST and ALT improving, BNP 512> 722, Hgb 10.6, WBC 7.9  UOP 1.8L/24hr Net +8.9L   Assessment & Plan:   Acute hypoxic respiratory failure from community acquired pneumonia, volume overload, acute HFpEF, HFrEF, and cor pulmonale - s/p intubation 11/5. Pleural effusions  Plan:  - cont full MV support and daily SBT> weaned for 45 mins yesterday, today doing better on PSV 10/5 but need to wean sedation, anxiety overnight a barrier and placed back on fentanyl - no issues with oxygenation  - cont daily PSV> moving towards extubation - minimize sedation> attempted to switch to precedex but did not tolerate> dropped BP.  Continue to minimize Fentanyl > ideally pushes, versed stopped> trying to avoid benzo's given age - cont aggressive diureses - sent trach asp> monitor thick secretions, at risk for plugging given aggressive diureses.  Cont nebs, guaifenesin, prn BD  - bronchial hygiene. - PAD protocol  - VAP/ PPI  - Azithromycin, Ceftriaxone stopped 11/7.  -  intermittent CXR  Shock> initially felt to be sepsis, with contributing new cardiomyopathy, severe TR, PH, and decompensated RV failure - new onset biventricular failure compared to previous echo 4/22> now EF 30-35%, new severe TR, global HK, mod reduced RVSF, PASP 50.8 (previously normal) Cardiomyopathy (new) RV failure, cor pulmonale, PH, severe TR - daughter reports issues with rate control with Afib have been an issues and recent LE swelling over several weeks, questioning if new cardiomyopathy is  rate related vs septic vs unknown Plan:  - greatly appreciate cardiology input - started on lasix gtt yest with improvement in UOP but remains markedly volume up, 8.9L positive.  Slight worsening of sCr but with diuresis have been able to wean NE, LFTs improving, and weaning better on vent today - depending on coox, cards may add milrinone, will need to monitor for ectopy and worsening hypotension closely  - adding albumin and increasing lasix gtt - PICC ordered.  Check coox (initial coox sent of femoral CVL/ inaccurate) - strict I/Os - LFTs improving   A fib with RVR.  CHADs-vasc 6 Hx of HLD. - cardiology following, appreciate the assistance. - rate remains controlled - Continue to hold Lopressor given ongoing pressor needs - continue amiodarone gtt, attempted to switch to PO 11/7 but had SVT/VT. - goal K> 4, Mag> 2 - changed back to [facility] 11/8 as plan for pleural effusion with diureses.  If renal function continues to worsen, will likely need to change back to heparin/ adjust dose - continue lipitor.  AKI  Hyponatremia, hypervolemic  Hypokalemia - monitor closely with increasing lasix and albumin today - ? If worsening sCr delayed response from prior hypotension/ developing ATN - trend renal indices  - replete electrolytes prn  - strict I/Os - consider renal US if continued worsening.    Hx of depression. - Hold home zoloft.  Hypothyroidism. - continue synthroid. - recheck TSH given afib.  No recent   Deconditioning. - PT/OT when able.  Overall> updated her daughter, [name] at the bedside, in whom she lives with.  Prognosis remains guarded given her age, co morbidities, and current conditions as above.  She wants her mother to have good quality of life.  GOC approached, and daughter would not want CPR and I agree that she would likely not recover from CPR at her age and underlying condition.  Will continued otherwise with aggressive care for now, with the exception of no  CPR.    Best Practice (right click and "Reselect all SmartList Selections" daily)   Diet/type: NPO, restart TF DVT prophylaxis: DOAC  GI prophylaxis: H2B Lines: Central line Foley:  Yes, and it is still needed Code Status:  full code> limited, no CPR 11/9 Last date of multidisciplinary goals of care discussion [updated pt's family at bedside]  CC time: 45 min.   Kennieth Rad, MSN, AG-ACNP-BC [facility] 08/15/2022, 9:22 AM  See Amion for pager If no response to pager, please call PCCM consult pager After 7:00 pm call Elink

## 2022-08-15 NOTE — Progress Notes (Signed)
Peripherally Inserted Central Catheter Placement  The IV Nurse has discussed with the patient and/or persons authorized to consent for the patient, the purpose of this procedure and the potential benefits and risks involved with this procedure.  The benefits include less needle sticks, lab draws from the catheter, and the patient may be discharged home with the catheter. Risks include, but not limited to, infection, bleeding, blood clot (thrombus formation), and puncture of an artery; nerve damage and irregular heartbeat and possibility to perform a PICC exchange if needed/ordered by physician.  Alternatives to this procedure were also discussed.  Bard Power PICC patient education guide, fact sheet on infection prevention and patient information card has been provided to patient /or left at bedside.    PICC Placement Documentation  PICC Triple Lumen 44/97/53 Right Basilic 37 cm 0 cm (Active)  Indication for Insertion or Continuance of Line Vasoactive infusions 08/15/22 1857  Exposed Catheter (cm) 0 cm 08/15/22 1857  Site Assessment Clean, Dry, Intact 08/15/22 1857  Lumen #1 Status Flushed;Saline locked;Blood return noted 08/15/22 1857  Lumen #2 Status Flushed;Saline locked;Blood return noted 08/15/22 1857  Lumen #3 Status Flushed;Saline locked;Blood return noted 08/15/22 1857  Dressing Type Transparent;Securing device 08/15/22 1857  Dressing Status Antimicrobial disc in place;Clean, Dry, Intact 08/15/22 Raoul Not Applicable 00/51/10 2111  Line Care Connections checked and tightened 08/15/22 1857  Line Adjustment (NICU/IV Team Only) No 08/15/22 1857  Dressing Intervention New dressing 08/15/22 1857  Dressing Change Due 08/22/22 08/15/22 1857       Mickel Baas  Melvin Marmo 08/15/2022, 6:58 PM

## 2022-08-15 NOTE — Progress Notes (Signed)
Collected and sent to Lab (sputum sample).

## 2022-08-15 NOTE — Progress Notes (Addendum)
Rounding Note    Patient Name: Patricia [name] Date of Encounter: 08/15/2022  [place] Cardiologist: Dr. Marlou Porch   Subjective   Remains intubated and sedated. Family at bedside. Reviewed results of IV lasix drip, with better urine output but only slightly negative given her IV fluid intake. Discussed options for next steps, see below.  Inpatient Medications    Scheduled Meds:  apixaban  5 mg Per Tube BID   atorvastatin  10 mg Per Tube Daily   Chlorhexidine Gluconate Cloth  6 each Topical Daily   famotidine  20 mg Per Tube Daily   feeding supplement (OSMOLITE 1.5 CAL)  1,000 mL Per Tube Q24H   feeding supplement (PROSource TF20)  60 mL Per Tube Daily   fentaNYL (SUBLIMAZE) injection  25 mcg Intravenous Once   levothyroxine  88 mcg Per Tube Q0600   mouth rinse  15 mL Mouth Rinse Q2H   sodium chloride flush  3 mL Intravenous Q12H   Continuous Infusions:  sodium chloride     amiodarone 30 mg/hr (08/15/22 0700)   dexmedetomidine (PRECEDEX) IV infusion     fentaNYL infusion INTRAVENOUS 25 mcg/hr (08/15/22 0700)   furosemide     furosemide (LASIX) 200 mg in dextrose 5 % 100 mL (2 mg/mL) infusion 4 mg/hr (08/15/22 0700)   norepinephrine (LEVOPHED) Adult infusion 4 mcg/min (08/15/22 0700)   PRN Meds: acetaminophen **OR** acetaminophen, albuterol, fentaNYL, fentaNYL (SUBLIMAZE) injection, ondansetron (ZOFRAN) IV, mouth rinse, polyethylene glycol   Vital Signs    Vitals:   08/15/22 0630 08/15/22 0645 08/15/22 0700 08/15/22 0800  BP: 118/75 119/64 107/62   Pulse: (!) 103 74 83   Resp: '20 15 15   '$ Temp: 98.8 F (37.1 C) 99.1 F (37.3 C) 99.1 F (37.3 C)   TempSrc:      SpO2: 100% 97% 96% 97%  Weight:      Height:        Intake/Output Summary (Last 24 hours) at 08/15/2022 1028 Last data filed at 08/15/2022 0700 Gross per 24 hour  Intake 1453.22 ml  Output 1855 ml  Net -401.78 ml      09/05/2022    1:59 PM 07/31/2022    2:16 PM 07/12/2022    4:02 PM  Last  3 Weights  Weight (lbs) 157 lb 13.6 oz 156 lb 157 lb  Weight (kg) 71.6 kg 70.761 kg 71.215 kg      Telemetry    Atrial fibrillation, rate controlled - Personally Reviewed  ECG    No new - Personally Reviewed  Physical Exam   GEN: intubated and sedated, on ventilator NECK: JVD to ear at 30 degrees CARDIAC: irregularly irregular rhythm, normal S1 and S2, no rubs or gallops. No murmur. VASCULAR: Radial pulses 2+ bilaterally.  RESPIRATORY:  Coarse R>L, ventilated breath sounds ABDOMEN: Soft, non-tender, non-distended MUSCULOSKELETAL:  normal bulk SKIN: Warm and dry, trivial bilateral LE edema NEUROLOGIC/PSYCHIATRIC: intubated and sedated   Labs    High Sensitivity Troponin:   Recent Labs  Lab 08/10/2022 0147 08/29/2022 0408 08/14/22 0520  TROPONINIHS '14 15 17     '$ Chemistry Recent Labs  Lab 08/24/2022 0147 08/20/2022 0226 08/13/22 0419 08/13/22 1452 08/14/22 0520 08/15/22 0406  NA 137   < > 132* 137 132* 131*  K 3.0*   < > 3.9 3.9 3.7 3.1*  CL 103   < > 102 99 98 97*  CO2 22   < > 22 21* 22 22  GLUCOSE 165*   < >  150* 139* 136* 124*  BUN 18   < > 33* 36* 37* 46*  CREATININE 0.75   < > 1.60* 1.38* 1.48* 1.86*  CALCIUM 8.9   < > 8.2* 8.8* 8.3* 7.9*  MG  --    < > 1.7  --  2.1 1.9  PROT 7.9  --   --  6.2*  --  6.4*  ALBUMIN 3.7  --   --  2.8*  --  2.4*  AST 26  --   --  243*  --  73*  ALT 20  --   --  437*  --  243*  ALKPHOS 61  --   --  100  --  100  BILITOT 0.8  --   --  0.6  --  0.5  GFRNONAA >60   < > 30* 36* 33* 25*  ANIONGAP 12   < > 8 17* 12 12   < > = values in this interval not displayed.    Lipids  Recent Labs  Lab 08/13/22 0419  TRIG 63    Hematology Recent Labs  Lab 08/13/22 0419 08/14/22 0520 08/15/22 0406  WBC 10.0 8.9 7.9  RBC 3.82* 3.71* 3.61*  HGB 11.3* 10.8* 10.6*  HCT 36.1 34.3* 32.4*  MCV 94.5 92.5 89.8  MCH 29.6 29.1 29.4  MCHC 31.3 31.5 32.7  RDW 15.2 15.3 15.2  PLT 196 201 192   Thyroid No results for input(s): "TSH",  "FREET4" in the last 168 hours.  BNP Recent Labs  Lab 08/13/22 0419 08/14/22 0520 08/15/22 0406  BNP 636.2* 512.1* 722.4*    DDimer No results for input(s): "DDIMER" in the last 168 hours.   Radiology    [place] EKG SITE RITE  Result Date: 08/15/2022 If Site Rite image not attached, placement could not be confirmed due to current cardiac rhythm.  DG Chest Port 1 View  Result Date: 08/14/2022 CLINICAL DATA:  Provided history: Respiratory failure. EXAM: PORTABLE CHEST 1 VIEW COMPARISON:  Prior chest radiographs 08/13/2022 and earlier. FINDINGS: ET tube unchanged in position, terminating just above the level of the clavicular heads. Partially imaged enteric tube, which passes below the level of the left hemidiaphragm and terminates outside of the field of view. The cardiomediastinal silhouette is unchanged. Aortic atherosclerosis. Similar appearance of a layering pleural effusion at the right lung base. Persistent small left pleural effusion. Persistent underlying bibasilar atelectasis and/or consolidation, although aeration at the left lung base has improved. New ill-defined opacity within the right upper lobe, which may reflect atelectasis or pneumonia. No acute bony abnormality identified. IMPRESSION: Support apparatus, as described. New ill-defined opacity within the right upper lobe, which may reflect atelectasis or pneumonia. Persistent bilateral pleural effusions with underlying bibasilar atelectasis and/or consolidation. Aeration of the left lung base has improved from the prior chest radiograph of 08/13/2022. Aortic Atherosclerosis (ICD10-I70.0). Electronically Signed   By: Kellie Simmering D.O.   On: 08/14/2022 08:32   ECHOCARDIOGRAM COMPLETE  Result Date: 08/13/2022    ECHOCARDIOGRAM REPORT   Patient Name:   [name] Date of Exam: 08/13/2022 Medical Rec #:  [id number]     Height:       67.0 in Accession #:    [id number]    Weight:       157.8 lb Date of Birth:  September 22, 1930     BSA:           1.829 m Patient Age:    88 years      BP:  119/75 mmHg Patient Gender: F             HR:           91 bpm. Exam Location:  Inpatient Procedure: 2D Echo, 3D Echo, Cardiac Doppler and Color Doppler Indications:    R06.02 SOB  History:        Patient has prior history of Echocardiogram examinations, most                 recent 01/16/2021. Abnormal ECG, Stroke, Arrythmias:Atrial                 Fibrillation; Signs/Symptoms:Hypotension and Syncope.  Sonographer:    Roseanna Rainbow RDCS Referring Phys: [id number]  Sonographer Comments: Technically difficult study due to poor echo windows and echo performed with patient supine and on artificial respirator. IMPRESSIONS  1. Left ventricular ejection fraction, by estimation, is 30 to 35%. The left ventricle has moderately decreased function. The left ventricle demonstrates global hypokinesis. There is mild left ventricular hypertrophy of the basal-septal segment. Left ventricular diastolic function could not be evaluated. There is the interventricular septum is flattened in diastole ('D' shaped left ventricle), consistent with right ventricular volume overload.  2. Right ventricular systolic function is moderately reduced. The right ventricular size is moderately enlarged. There is moderately elevated pulmonary artery systolic pressure. The estimated right ventricular systolic pressure is 01.0 mmHg.  3. Left atrial size was moderately dilated.  4. Right atrial size was moderately dilated.  5. The mitral valve is normal in structure. Mild to moderate mitral valve regurgitation.  6. The tricuspid valve is abnormal. Tricuspid valve regurgitation is severe.  7. The aortic valve is tricuspid. There is mild thickening of the aortic valve. Aortic valve regurgitation is not visualized. Aortic valve sclerosis is present, with no evidence of aortic valve stenosis.  8. The inferior vena cava is dilated in size with <50% respiratory variability, suggesting right atrial  pressure of 15 mmHg. Comparison(s): A prior study was performed on 01/16/2021. Prior images reviewed side by side. The left ventricular function is worsened. The right ventricular systolic function is worse. Tricuspid insufficiency is now severe. FINDINGS  Left Ventricle: Left ventricular ejection fraction, by estimation, is 30 to 35%. The left ventricle has moderately decreased function. The left ventricle demonstrates global hypokinesis. 3D left ventricular ejection fraction analysis performed but not reported based on interpreter judgement due to suboptimal tracking. The left ventricular internal cavity size was normal in size. There is mild left ventricular hypertrophy of the basal-septal segment. The interventricular septum is flattened in diastole  ('D' shaped left ventricle), consistent with right ventricular volume overload. Left ventricular diastolic function could not be evaluated due to atrial fibrillation. Left ventricular diastolic function could not be evaluated. Right Ventricle: The right ventricular size is moderately enlarged. No increase in right ventricular wall thickness. Right ventricular systolic function is moderately reduced. There is moderately elevated pulmonary artery systolic pressure. The tricuspid  regurgitant velocity is 2.99 m/s, and with an assumed right atrial pressure of 15 mmHg, the estimated right ventricular systolic pressure is 27.2 mmHg. Left Atrium: Left atrial size was moderately dilated. Right Atrium: Right atrial size was moderately dilated. Pericardium: There is no evidence of pericardial effusion. Mitral Valve: The mitral valve is normal in structure. Mild to moderate mitral valve regurgitation, with centrally-directed jet. Tricuspid Valve: The tricuspid valve is abnormal. Tricuspid valve regurgitation is severe. Aortic Valve: The aortic valve is tricuspid. There is mild thickening of the aortic valve. Aortic  valve regurgitation is not visualized. Aortic valve sclerosis  is present, with no evidence of aortic valve stenosis. Pulmonic Valve: The pulmonic valve was grossly normal. Pulmonic valve regurgitation is not visualized. Aorta: The aortic root and ascending aorta are structurally normal, with no evidence of dilitation. Venous: The inferior vena cava is dilated in size with less than 50% respiratory variability, suggesting right atrial pressure of 15 mmHg. The inferior vena cava and the hepatic vein show a pattern of systolic flow reversal, suggestive of tricuspid regurgitation. IAS/Shunts: No atrial level shunt detected by color flow Doppler.  LEFT VENTRICLE PLAX 2D LVIDd:         4.60 cm LVIDs:         3.70 cm LV PW:         0.90 cm LV IVS:        1.40 cm LVOT diam:     2.30 cm      3D Volume EF: LV SV:         45           3D EF:        43 % LV SV Index:   25           LV EDV:       115 ml LVOT Area:     4.15 cm     LV ESV:       66 ml                             LV SV:        49 ml  LV Volumes (MOD) LV vol d, MOD A2C: 117.0 ml LV vol d, MOD A4C: 93.8 ml LV vol s, MOD A2C: 93.4 ml LV vol s, MOD A4C: 59.1 ml LV SV MOD A2C:     23.6 ml LV SV MOD A4C:     93.8 ml LV SV MOD BP:      29.1 ml RIGHT VENTRICLE            IVC RV S prime:     7.18 cm/s  IVC diam: 3.10 cm TAPSE (M-mode): 1.3 cm LEFT ATRIUM             Index        RIGHT ATRIUM           Index LA diam:        4.60 cm 2.52 cm/m   RA Area:     22.90 cm LA Vol (A2C):   63.9 ml 34.94 ml/m  RA Volume:   65.60 ml  35.87 ml/m LA Vol (A4C):   67.0 ml 36.64 ml/m LA Biplane Vol: 68.9 ml 37.68 ml/m  AORTIC VALVE LVOT Vmax:   77.52 cm/s LVOT Vmean:  48.700 cm/s LVOT VTI:    0.108 m  AORTA Ao Root diam: 3.60 cm Ao Asc diam:  3.60 cm MITRAL VALVE                  TRICUSPID VALVE MV Area (PHT): 4.49 cm       TR Peak grad:   35.8 mmHg MV Decel Time: 169 msec       TR Vmax:        299.00 cm/s MR Peak grad:    76.7 mmHg MR Mean grad:    45.0 mmHg    SHUNTS MR Vmax:         438.00 cm/s  Systemic  VTI:  0.11 m MR Vmean:        305.0  cm/s   Systemic Diam: 2.30 cm MR PISA:         1.01 cm MR PISA Eff ROA: 9 mm MR PISA Radius:  0.40 cm MV E velocity: 94.87 cm/s Dani Gobble Croitoru MD Electronically signed by Sanda Klein MD Signature Date/Time: 08/13/2022/12:51:08 PM    Final     Cardiac Studies   Echo 08/13/22  1. Left ventricular ejection fraction, by estimation, is 30 to 35%. The  left ventricle has moderately decreased function. The left ventricle  demonstrates global hypokinesis. There is mild left ventricular  hypertrophy of the basal-septal segment. Left  ventricular diastolic function could not be evaluated. There is the  interventricular septum is flattened in diastole ('D' shaped left  ventricle), consistent with right ventricular volume overload.   2. Right ventricular systolic function is moderately reduced. The right  ventricular size is moderately enlarged. There is moderately elevated  pulmonary artery systolic pressure. The estimated right ventricular  systolic pressure is 65.5 mmHg.   3. Left atrial size was moderately dilated.   4. Right atrial size was moderately dilated.   5. The mitral valve is normal in structure. Mild to moderate mitral valve  regurgitation.   6. The tricuspid valve is abnormal. Tricuspid valve regurgitation is  severe.   7. The aortic valve is tricuspid. There is mild thickening of the aortic  valve. Aortic valve regurgitation is not visualized. Aortic valve  sclerosis is present, with no evidence of aortic valve stenosis.   8. The inferior vena cava is dilated in size with <50% respiratory  variability, suggesting right atrial pressure of 15 mmHg.   Comparison(s): A prior study was performed on 01/16/2021. Prior images  reviewed side by side. The left ventricular function is worsened. The  right ventricular systolic function is worse. Tricuspid insufficiency is  now severe.   Patient Profile     88 y.o. female with a hx of prior CVA, hypothyroidism, GERD and known Afib on  apixaban who presented with sepsis secondary to pneumonia with course complicated by Afib with RVR for which Cardiology has been consulted.   Assessment & Plan    #Cardiomyopathy, new Acute systolic and diastolic heart failure Right sided heart failure -EF 30-35%, severe TR, elevated right sided pressures -no blood pressure room for GDMT given pressors -trialed lasix drip yesterday with improved urine output but barely maintaining net negative given IV fluid intake. Will increase drip today -Cr worse today, 1.86. BNP also increased -will discuss with team utility of albumin given her hypoalbuminemia. Will also check a coox--while she is warm on exam, if her coox is low would consider milrinone infusion given her right sided pressures -cardiomyopathy may be stress induced, will need to re-evaluate as an outpatient. Given her current comorbidities, do not anticipate inpatient cath at this time. -monitor renal function, electrolytes. Receiving K/Mg repletion today.  #Afib with RVR: unknown duration, suspect paroxysmal -suspect driven by respiratory infection -rates variable -CHADs-vasc 6 -Continue amiodarone, attempted transition to oral 11/7 but had significant ectopy. Returned to IV amiodarone with improvement -converted to oral DOAC (was on apixaban 5 mg bid) given no plans for thoracentesis -blood pressure low, beta blocker stopped given need for pressors -Management of pneumonia per primary team   #Sepsis secondary to Pneumonia #Acute Hypoxic Respiratory Failure #Pleural effusion -intubated due to persistent hypoxia -no plan for thoracentesis at this time -management per primary team -Diuresis as above.   #  History of CVA: -anticoagulation as above -continue statin  Addended to add: coox returned at 35, lower than I'd like and concerning for a component of cardiogenic shock. Discussed with PCCM, will start milrinone and monitor for hypotension and arrhythmia.  I spoke with  daughter at bedside. We discussed that her heart, lungs, and kidneys at this point are all affected by her critical illness. Daughter expresses that her mother said she wasn't ready to go during another recent illness. We discussed that we still have options, but daughter volunteered that if her heart were to stop, she would not want CPR. This has already been documented in the chart. Family would like to pursue aggressive medical measures up to that point.  CRITICAL CARE Patient is critically ill with multiple organ systems affected and requires high complexity decision making. Total critical care time: 50 minutes. This time includes gathering of history, evaluation of patient's response to treatment, examination of patient, review of laboratory and imaging studies, and coordination with consultants. Greater than 50% of time spent in direct patient care.   For questions or updates, please contact [place] Please consult [email or web address] for contact info under     Signed, Buford Dresser, MD  08/15/2022, 10:28 AM

## 2022-08-16 ENCOUNTER — Inpatient Hospital Stay (HOSPITAL_COMMUNITY): Payer: Medicare PPO

## 2022-08-16 DIAGNOSIS — I9589 Other hypotension: Secondary | ICD-10-CM | POA: Diagnosis not present

## 2022-08-16 DIAGNOSIS — A419 Sepsis, unspecified organism: Secondary | ICD-10-CM | POA: Diagnosis not present

## 2022-08-16 DIAGNOSIS — J189 Pneumonia, unspecified organism: Secondary | ICD-10-CM | POA: Diagnosis not present

## 2022-08-16 DIAGNOSIS — E876 Hypokalemia: Secondary | ICD-10-CM | POA: Diagnosis not present

## 2022-08-16 DIAGNOSIS — J9601 Acute respiratory failure with hypoxia: Secondary | ICD-10-CM | POA: Diagnosis not present

## 2022-08-16 LAB — COOXEMETRY PANEL
Carboxyhemoglobin: 1.7 % — ABNORMAL HIGH (ref 0.5–1.5)
Methemoglobin: 1 % (ref 0.0–1.5)
O2 Saturation: 77.5 %
Total hemoglobin: 10.8 g/dL — ABNORMAL LOW (ref 12.0–16.0)

## 2022-08-16 LAB — BASIC METABOLIC PANEL
Anion gap: 9 (ref 5–15)
BUN: 52 mg/dL — ABNORMAL HIGH (ref 8–23)
CO2: 26 mmol/L (ref 22–32)
Calcium: 8 mg/dL — ABNORMAL LOW (ref 8.9–10.3)
Chloride: 96 mmol/L — ABNORMAL LOW (ref 98–111)
Creatinine, Ser: 1.68 mg/dL — ABNORMAL HIGH (ref 0.44–1.00)
GFR, Estimated: 29 mL/min — ABNORMAL LOW (ref >60)
Glucose, Bld: 142 mg/dL — ABNORMAL HIGH (ref 70–99)
Potassium: 3.4 mmol/L — ABNORMAL LOW (ref 3.5–5.1)
Sodium: 131 mmol/L — ABNORMAL LOW (ref 135–145)

## 2022-08-16 LAB — CBC
HCT: 31.5 % — ABNORMAL LOW (ref 36.0–46.0)
Hemoglobin: 10.3 g/dL — ABNORMAL LOW (ref 12.0–15.0)
MCH: 29.1 pg (ref 26.0–34.0)
MCHC: 32.7 g/dL (ref 30.0–36.0)
MCV: 89 fL (ref 80.0–100.0)
Platelets: 190 10*3/uL (ref 150–400)
RBC: 3.54 MIL/uL — ABNORMAL LOW (ref 3.87–5.11)
RDW: 15.3 % (ref 11.5–15.5)
WBC: 8.1 10*3/uL (ref 4.0–10.5)
nRBC: 0.2 % (ref 0.0–0.2)

## 2022-08-16 LAB — COMPREHENSIVE METABOLIC PANEL
ALT: 154 U/L — ABNORMAL HIGH (ref 0–44)
AST: 42 U/L — ABNORMAL HIGH (ref 15–41)
Albumin: 2.6 g/dL — ABNORMAL LOW (ref 3.5–5.0)
Alkaline Phosphatase: 124 U/L (ref 38–126)
Anion gap: 10 (ref 5–15)
BUN: 47 mg/dL — ABNORMAL HIGH (ref 8–23)
CO2: 24 mmol/L (ref 22–32)
Calcium: 7.6 mg/dL — ABNORMAL LOW (ref 8.9–10.3)
Chloride: 93 mmol/L — ABNORMAL LOW (ref 98–111)
Creatinine, Ser: 1.61 mg/dL — ABNORMAL HIGH (ref 0.44–1.00)
GFR, Estimated: 30 mL/min — ABNORMAL LOW (ref >60)
Glucose, Bld: 267 mg/dL — ABNORMAL HIGH (ref 70–99)
Potassium: 2.9 mmol/L — ABNORMAL LOW (ref 3.5–5.1)
Sodium: 127 mmol/L — ABNORMAL LOW (ref 135–145)
Total Bilirubin: 0.5 mg/dL (ref 0.3–1.2)
Total Protein: 6.2 g/dL — ABNORMAL LOW (ref 6.5–8.1)

## 2022-08-16 LAB — GLUCOSE, CAPILLARY
Glucose-Capillary: 119 mg/dL — ABNORMAL HIGH (ref 70–99)
Glucose-Capillary: 132 mg/dL — ABNORMAL HIGH (ref 70–99)
Glucose-Capillary: 126 mg/dL — ABNORMAL HIGH (ref 70–99)
Glucose-Capillary: 116 mg/dL — ABNORMAL HIGH (ref 70–99)
Glucose-Capillary: 125 mg/dL — ABNORMAL HIGH (ref 70–99)

## 2022-08-16 LAB — MAGNESIUM: Magnesium: 2.4 mg/dL (ref 1.7–2.4)

## 2022-08-16 MED ORDER — METOLAZONE 5 MG PO TABS
10.0000 mg | ORAL_TABLET | Freq: Once | ORAL | Status: AC
  Administered 2022-08-16: 10 mg
  Filled 2022-08-16: qty 2

## 2022-08-16 MED ORDER — FLEET ENEMA 7-19 GM/118ML RE ENEM: 1.0000 | ENEMA | Freq: Every day | RECTAL | Status: DC | PRN

## 2022-08-16 MED ORDER — POTASSIUM CHLORIDE 20 MEQ PO PACK
40.0000 meq | PACK | ORAL | Status: AC
  Administered 2022-08-16 – 2022-08-17 (×2): 40 meq
  Filled 2022-08-16 (×2): qty 2

## 2022-08-16 MED ORDER — NOREPINEPHRINE 16 MG/250ML-% IV SOLN
0.0000 ug/min | INTRAVENOUS | Status: DC
  Administered 2022-08-16: 3 ug/min via INTRAVENOUS
  Administered 2022-08-18: 2 ug/min via INTRAVENOUS
  Filled 2022-08-16 (×3): qty 250

## 2022-08-16 MED ORDER — DEXTROSE 5 % IV SOLN: 500.0000 mg | Freq: Once | INTRAVENOUS | Status: DC

## 2022-08-16 MED ORDER — DOCUSATE SODIUM 50 MG/5ML PO LIQD
100.0000 mg | Freq: Two times a day (BID) | ORAL | Status: DC
  Administered 2022-08-16 – 2022-08-17 (×4): 100 mg
  Filled 2022-08-16 (×4): qty 10

## 2022-08-16 MED ORDER — POTASSIUM CHLORIDE 20 MEQ PO PACK
40.0000 meq | PACK | ORAL | Status: AC
  Administered 2022-08-16 (×2): 40 meq
  Filled 2022-08-16 (×2): qty 2

## 2022-08-16 MED ORDER — BISACODYL 10 MG RE SUPP: 10.0000 mg | Freq: Every day | RECTAL | Status: DC | PRN

## 2022-08-16 MED ORDER — POLYETHYLENE GLYCOL 3350 17 G PO PACK
17.0000 g | PACK | Freq: Every day | ORAL | Status: DC
  Administered 2022-08-16 – 2022-08-17 (×2): 17 g
  Filled 2022-08-16 (×2): qty 1

## 2022-08-16 MED ORDER — FUROSEMIDE 10 MG/ML IJ SOLN
80.0000 mg | Freq: Once | INTRAMUSCULAR | Status: AC
  Administered 2022-08-16: 80 mg via INTRAVENOUS
  Filled 2022-08-16: qty 8

## 2022-08-16 NOTE — TOC Progression Note (Signed)
Transition of Care [facility]) - Progression Note    Patient Details  Name: Fynley Chrystal MRN: [id number] Date of Birth: 08/04/1930  [facility] of [facility] CM/SW Contact  Leeroy Cha, RN Phone Number: 08/16/2022, 7:26 AM  Clinical Narrative:    Remains on vent at 30%, iv pressors, following for toc needs and out come.   Expected Discharge Plan: Home/Self Care Barriers to Discharge: Continued Medical Work up  Expected Discharge Plan and Services Expected Discharge Plan: Home/Self Care   Discharge Planning Services: CM Consult   Living arrangements for the past 2 months: Single Family Home                                       Social Determinants of Health (SDOH) Interventions    Readmission Risk Interventions   No data to display

## 2022-08-16 NOTE — IPAL (Signed)
  Interdisciplinary Goals of Care Family Meeting   Date carried out: 08/16/2022  Location of the meeting: Conference room   Member's involved: Nurse Practitioner, Bedside Registered Nurse, and Family Member or next of kin  Durable Power of Attorney or acting medical decision maker: Adult Children. Patricia [name] (daughter), Patricia [name] (Daughter), Patricia [name] (Son)   Discussion: We discussed goals of care for Patricia [name] .  Children discussed that [name] has discussed previously with them that she would not want to live in a nursing home long term. Discussed current condition and difficult weaning vent based on poor ventilation, volume overload. Discussed possible options if unable to wean from vent. Family relays that [name] would not want a tracheostomy. We discussed different pathways if unable to wean from vent such as one-way extubation with medical therapies vs comfort care. Family understands these options. Questions answered. Plan to meet again Monday and re-evaluate [name] status if unable to be extubated and will decided comfort care vs one-way extubation.   Code status: Limited Code. No CPR. Okay with intubation   Disposition: Continue current acute care  Time spent for the meeting: 22 minutes    Hayden Pedro, AGACNP-BC [place] Pgr: [phone]

## 2022-08-16 NOTE — Progress Notes (Signed)
Rounding Note    Patient Name: Patricia [name] Date of Encounter: 08/16/2022  [facility] Cardiologist: Dr. Marlou Porch   Subjective   Remains intubated and sedated. Family at bedside. Coox initially returned low at 76 yesterday, but this was not a true mixed venous. Once PICC placed, coox returned at 58, so milrinone not started.  Having improved urine output on lasix drip, though still only slightly negative given the volume she is receiving IV. Renal function slightly better today. K low, being repleted.  Inpatient Medications    Scheduled Meds:  apixaban  5 mg Per Tube BID   atorvastatin  10 mg Per Tube Daily   Chlorhexidine Gluconate Cloth  6 each Topical Daily   famotidine  20 mg Per Tube Daily   feeding supplement (OSMOLITE 1.5 CAL)  1,000 mL Per Tube Q24H   feeding supplement (PROSource TF20)  60 mL Per Tube Daily   guaiFENesin  15 mL Per Tube Q6H   levothyroxine  88 mcg Per Tube Q0600   mouth rinse  15 mL Mouth Rinse Q2H   oxyCODONE  5 mg Per Tube Q6H   potassium chloride  40 mEq Per Tube Q4H   sodium chloride flush  3 mL Intravenous Q12H   Continuous Infusions:  sodium chloride     amiodarone 30 mg/hr (08/16/22 0641)   fentaNYL infusion INTRAVENOUS 75 mcg/hr (08/16/22 0641)   furosemide (LASIX) 200 mg in dextrose 5 % 100 mL (2 mg/mL) infusion 8 mg/hr (08/16/22 0641)   norepinephrine (LEVOPHED) Adult infusion 7 mcg/min (08/16/22 0641)   PRN Meds: acetaminophen **OR** acetaminophen, albuterol, fentaNYL, fentaNYL (SUBLIMAZE) injection, midazolam, ondansetron (ZOFRAN) IV, mouth rinse, polyethylene glycol   Vital Signs    Vitals:   08/16/22 0615 08/16/22 0630 08/16/22 0645 08/16/22 0900  BP: 122/67 108/64 111/71   Pulse: 82 90 91   Resp: '17 16 17   '$ Temp: 98.4 F (36.9 C) 98.4 F (36.9 C) 98.4 F (36.9 C)   TempSrc:      SpO2: 96% 92% 92% 97%  Weight:      Height:        Intake/Output Summary (Last 24 hours) at 08/16/2022 0909 Last data filed at  08/16/2022 0650 Gross per 24 hour  Intake 1857.56 ml  Output 2275 ml  Net -417.44 ml      08/30/2022    1:59 PM 07/31/2022    2:16 PM 07/12/2022    4:02 PM  Last 3 Weights  Weight (lbs) 157 lb 13.6 oz 156 lb 157 lb  Weight (kg) 71.6 kg 70.761 kg 71.215 kg      Telemetry    Atrial fibrillation, rate controlled, with intermittent PVCs - Personally Reviewed  ECG    No new - Personally Reviewed  Physical Exam   GEN: intubated and sedated, on ventilator. Awakens eyes to voice NECK: JVD to jaw CARDIAC: irregularly regular rhythm, normal S1 and S2, no rubs or gallops. 2/6 systolic murmur VASCULAR: Radial pulses 2+ bilaterally.  RESPIRATORY:  Diffusely coarse and diminished at bases, ventilated breath sounds ABDOMEN: Soft, non-tender, non-distended MUSCULOSKELETAL:  normal bulk SKIN: Warm and dry, trivial bilateral LE edema NEUROLOGIC/PSYCHIATRIC: intubated and sedated   Labs    High Sensitivity Troponin:   Recent Labs  Lab 08/27/2022 0147 09/03/2022 0408 08/14/22 0520  TROPONINIHS '14 15 17     '$ Chemistry Recent Labs  Lab 08/13/22 1452 08/14/22 0520 08/15/22 0406 08/16/22 0525  NA 137 132* 131* 127*  K 3.9 3.7 3.1* 2.9*  CL 99 98 97* 93*  CO2 21* '22 22 24  '$ GLUCOSE 139* 136* 124* 267*  BUN 36* 37* 46* 47*  CREATININE 1.38* 1.48* 1.86* 1.61*  CALCIUM 8.8* 8.3* 7.9* 7.6*  MG  --  2.1 1.9 2.4  PROT 6.2*  --  6.4* 6.2*  ALBUMIN 2.8*  --  2.4* 2.6*  AST 243*  --  73* 42*  ALT 437*  --  243* 154*  ALKPHOS 100  --  100 124  BILITOT 0.6  --  0.5 0.5  GFRNONAA 36* 33* 25* 30*  ANIONGAP 17* '12 12 10    '$ Lipids  Recent Labs  Lab 08/13/22 0419  TRIG 63    Hematology Recent Labs  Lab 08/14/22 0520 08/15/22 0406 08/16/22 0525  WBC 8.9 7.9 8.1  RBC 3.71* 3.61* 3.54*  HGB 10.8* 10.6* 10.3*  HCT 34.3* 32.4* 31.5*  MCV 92.5 89.8 89.0  MCH 29.1 29.4 29.1  MCHC 31.5 32.7 32.7  RDW 15.3 15.2 15.3  PLT 201 192 190   Thyroid  Recent Labs  Lab 08/15/22 1544   TSH 3.321    BNP Recent Labs  Lab 08/13/22 0419 08/14/22 0520 08/15/22 0406  BNP 636.2* 512.1* 722.4*    DDimer No results for input(s): "DDIMER" in the last 168 hours.   Radiology    DG Chest Port 1 View  Result Date: 08/16/2022 CLINICAL DATA:  Respiratory failure. EXAM: PORTABLE CHEST 1 VIEW COMPARISON:  One-view abdomen 08/15/2022 FINDINGS: Endotracheal tube is stable. Gastric tube courses off the inferior border the film. Heart is enlarged. Atherosclerotic changes are present at the aortic arch. Diffuse interstitial and airspace disease is similar the prior exam. Left greater than right pleural effusions are stable as well. IMPRESSION: 1. Stable support apparatus. 2. Stable diffuse interstitial and airspace disease and left greater than right pleural effusions. Electronically Signed   By: San Morelle M.D.   On: 08/16/2022 07:13   DG CHEST PORT 1 VIEW  Result Date: 08/15/2022 CLINICAL DATA:  PICC line placement EXAM: PORTABLE CHEST 1 VIEW COMPARISON:  08/14/2022, CT 07/31/2022, chest x-ray 08/13/2022, 08/12/2022 FINDINGS: Endotracheal tube tip about 6.4 cm superior to the carina. Esophageal tube tip below the diaphragm but incompletely visualized. New right upper extremity central venous catheter with tip at the distal SVC. Cardiomegaly with pleural effusions and left greater than right basilar airspace disease. Heterogeneous ground-glass disease in the right upper lobe. Aortic atherosclerosis. IMPRESSION: 1. New right upper extremity central venous catheter with tip at the distal SVC. 2. Cardiomegaly with bilateral pleural effusions and similar appearance of bilateral interstitial and ground-glass opacities with left greater than right basilar consolidations. Electronically Signed   By: Donavan Foil M.D.   On: 08/15/2022 20:16   [place] EKG SITE RITE  Result Date: 08/15/2022 If Site Rite image not attached, placement could not be confirmed due to current cardiac rhythm.    Cardiac Studies   Echo 08/13/22  1. Left ventricular ejection fraction, by estimation, is 30 to 35%. The  left ventricle has moderately decreased function. The left ventricle  demonstrates global hypokinesis. There is mild left ventricular  hypertrophy of the basal-septal segment. Left  ventricular diastolic function could not be evaluated. There is the  interventricular septum is flattened in diastole ('D' shaped left  ventricle), consistent with right ventricular volume overload.   2. Right ventricular systolic function is moderately reduced. The right  ventricular size is moderately enlarged. There is moderately elevated  pulmonary artery systolic pressure. The estimated  right ventricular  systolic pressure is 35.7 mmHg.   3. Left atrial size was moderately dilated.   4. Right atrial size was moderately dilated.   5. The mitral valve is normal in structure. Mild to moderate mitral valve  regurgitation.   6. The tricuspid valve is abnormal. Tricuspid valve regurgitation is  severe.   7. The aortic valve is tricuspid. There is mild thickening of the aortic  valve. Aortic valve regurgitation is not visualized. Aortic valve  sclerosis is present, with no evidence of aortic valve stenosis.   8. The inferior vena cava is dilated in size with <50% respiratory  variability, suggesting right atrial pressure of 15 mmHg.   Comparison(s): A prior study was performed on 01/16/2021. Prior images  reviewed side by side. The left ventricular function is worsened. The  right ventricular systolic function is worse. Tricuspid insufficiency is  now severe.   Patient Profile     88 y.o. female with a hx of prior CVA, hypothyroidism, GERD and known Afib on apixaban who presented with sepsis secondary to pneumonia with course complicated by Afib with RVR for which Cardiology has been consulted.   Assessment & Plan    #Cardiomyopathy, new Acute systolic and diastolic heart failure Right sided  heart failure -EF 30-35%, severe TR, elevated right sided pressures -no blood pressure room for GDMT given pressors -continue lasix drip. Still 8L positive but down from peak (9.1L). No recent weights. She is making good urine but has significant input from multiple IV sources. -Cr improved slightly compared to yesterday, still over baseline. -see above re: coox. Initial was low, but recheck on PICC was 77. Warm on exam. Will hold on milrinone at this time. -cardiomyopathy may be stress induced, will need to re-evaluate as an outpatient. Given her current comorbidities, do not anticipate inpatient cath at this time. -monitor renal function, electrolytes. Receiving K/Mg repletion today. Once K normalizes, could consider adding a dose of metolazone, but would not do this while K is low as this will only drop it further.  #Afib with RVR: unknown duration, suspect paroxysmal -suspect driven by respiratory infection -rates variable -CHADs-vasc 6 -Continue amiodarone, attempted transition to oral 11/7 but had significant ectopy. Returned to IV amiodarone with improvement -converted to oral DOAC (was on apixaban 5 mg bid) given no plans for thoracentesis -blood pressure low, beta blocker stopped given need for pressors -Management of pneumonia per primary team   #Sepsis secondary to Pneumonia #Acute Hypoxic Respiratory Failure #Pleural effusion -intubated due to persistent hypoxia -no plan for thoracentesis at this time -management per primary team -Diuresis as above.   #History of CVA: -anticoagulation as above -continue statin  I spoke with daughter at bedside. They are planning a family meeting this afternoon with the PCCM team to discuss overall clinical picture. Plan at this time is to continue aggressive medical care but they would like a better understanding of the patient's overall prognosis. From a cardiac perspective, she is warm and making good urine. Her lung issues are primary at  this point.  CRITICAL CARE Patient is critically ill with multiple organ systems affected and requires high complexity decision making. Total critical care time:45  minutes. This time includes gathering of history, evaluation of patient's response to treatment, examination of patient, review of laboratory and imaging studies, and coordination with consultants. Greater than 50% of time spent in direct patient care.   For questions or updates, please contact [place] [place] Please consult [email or web address] for contact info under  Signed, Buford Dresser, MD  08/16/2022, 9:09 AM

## 2022-08-16 NOTE — Progress Notes (Signed)
NAME:  Patricia [name], MRN:  [id number], DOB:  03-19-1930, LOS: 7 ADMISSION DATE:  08/27/2022, CONSULTATION DATE:  08/10/22 REFERRING MD:  Dr. Tawanna Solo, [facility], CHIEF COMPLAINT:  Respiratory distress   History of Present Illness:  88 yo female presented to [facility] ER with chest pain, dyspnea, back pain, cough with sputum, subjective fever (Tm 100.3 F).  SpO2 in 80's on room air in ER.  CXR showed Rt infrahilar ASD and she was started on ABx for community acquired pneumonia.  She developed increased respiratory distress, hypoxia with increased O2 needs, A fib with RVR and hypotension on 08/10/22.  She was transferred to ICU and PCCM consulted to assist with ICU management.  Pertinent  Medical History  CVA, Hypothyroidism, GERD, A fib on eliquis, Depression, Bladder incontinence, HLD  Significant Hospital Events: Including procedures, antibiotic start and stop dates in addition to other pertinent events   11/03 Admit 11/04 Transfer to ICU, cardiology consulted, started amiodarone gtt 11/6 RVP positive for rhinovirus 11/8 failed SBT low TV, failed transition to PO amio> SVT/ VT back on IV, 675 UOP with diureses> lasix increased, gtt added   Interim History / Subjective:  This AM on PS 12/5. Low RR, TV 600s.   Objective   Blood pressure 111/71, pulse 91, temperature 98.4 F (36.9 C), resp. rate 17, height '5\' 7"'$  (1.702 m), weight 71.6 kg, SpO2 92 %.    Vent Mode: PRVC FiO2 (%):  [30 %] 30 % Set Rate:  [16 bmp] 16 bmp Vt Set:  [430 mL] 430 mL PEEP:  [5 cmH20] 5 cmH20 Plateau Pressure:  [16 cmH20-19 cmH20] 17 cmH20   Intake/Output Summary (Last 24 hours) at 08/16/2022 0854 Last data filed at 08/16/2022 6073 Gross per 24 hour  Intake 1857.56 ml  Output 2625 ml  Net -767.44 ml   Filed Weights   09/04/2022 1359  Weight: 71.6 kg    Examination: General: Critically ill elderly female, lying in bed, on vent  HEENT: ETT/OG in place  Neuro: Opens eyes with verbal/physical stimulation, follows  simple commands, pupils pin-point  CV: Irregular, HR 99  PULM: clear breath sounds, vent assisted breaths  GI: soft, active bowel sounds  Extremities: warm/dry, LE 2+ edema   Assessment & Plan:   Acute hypoxic respiratory failure from community acquired pneumonia, volume overload, acute HFpEF, HFrEF, and cor pulmonale - s/p intubation 11/5. +Rhino/Entero Virus  Pleural effusions  -S/P Azithromycin/Rocephin, Stopped 11/7 Plan:  - cont full MV support and daily SBT> currently 12/5.  - minimize sedation> attempted to switch to precedex but did not tolerate due to hypotension.  Continue to minimize Fentanyl.  - cont aggressive diureses - Follow Culture Data. Trach culture pending 11/9  - Cont nebs, guaifenesin, prn BD  - bronchial hygiene. - PAD protocol  - VAP/ PPI  - intermittent CXR  Mixed Cardiogenic, Septic Shock> initially felt to be sepsis, with contributing new cardiomyopathy, severe TR, PH, and decompensated RV failure - new onset biventricular failure compared to previous echo 4/22> now EF 30-35%, new severe TR, global HK, mod reduced RVSF, PASP 50.8 (previously normal) - SVO2 77.5 on COOX 11/10  Cardiomyopathy (new) RV failure, cor pulmonale, PH, severe TR - daughter reports issues with rate control with Afib have been an issues and recent LE swelling over several weeks, questioning if new cardiomyopathy is rate related vs septic vs unknown Plan:  - Cardiology following  - Continue Lasix gtt. Give 10 mg metolazone x 1  - strict I/Os -  LFTs improving   A fib with RVR.  CHADs-vasc 6 Hx of HLD. Plan - cardiology following, appreciate the assistance. - Continue to hold Lopressor given ongoing pressor needs - continue amiodarone gtt, attempted to switch to PO 11/7 but had SVT/VT. - goal K> 4, Mag> 2 - changed back to [facility] 11/8 as plan for pleural effusion with diureses.  If renal function continues to worsen, will likely need to change back to heparin/ adjust dose -  continue lipitor.  AKI  Hyponatremia, hypervolemic  Hypokalemia Plan - monitor closely - replacing k this AM >Repeat BMP 1600  - trend renal indices  - replete electrolytes prn  - strict I/Os - consider renal US if continued worsening.    Constipation  Last BM 11/3  Plan - Add BM regimen  - Add suppository   Hx of depression. - Hold home zoloft.  Hypothyroidism. TSH 3.321 11/9  - continue synthroid.  Deconditioning. - PT/OT when able.  Overall> updated her daughter, [name] at the bedside, in whom she lives with.  Prognosis remains guarded given her age, co morbidities, and current conditions as above.  She wants her mother to have good quality of life.  GOC approached, and daughter would not want CPR and I agree that she would likely not recover from CPR at her age and underlying condition.  Will continued otherwise with aggressive care for now, with the exception of no CPR.    Best Practice (right click and "Reselect all SmartList Selections" daily)   Diet/type: Continue TF DVT prophylaxis: DOAC  GI prophylaxis: H2B Lines: Central line., D/C Femoral Line. PICC in place  Foley:  Yes, and it is still needed Code Status:  full code> limited, no CPR 11/9 Last date of multidisciplinary goals of care discussion [updated pt's family at bedside]  CC time: 42 min.   Hayden Pedro, AGACNP-BC [facility]  PCCM Pgr: [phone]

## 2022-08-17 ENCOUNTER — Inpatient Hospital Stay (HOSPITAL_COMMUNITY): Payer: Medicare PPO

## 2022-08-17 DIAGNOSIS — I5082 Biventricular heart failure: Secondary | ICD-10-CM | POA: Diagnosis not present

## 2022-08-17 DIAGNOSIS — R570 Cardiogenic shock: Secondary | ICD-10-CM | POA: Diagnosis not present

## 2022-08-17 DIAGNOSIS — A419 Sepsis, unspecified organism: Secondary | ICD-10-CM | POA: Diagnosis not present

## 2022-08-17 DIAGNOSIS — I4819 Other persistent atrial fibrillation: Secondary | ICD-10-CM

## 2022-08-17 DIAGNOSIS — J189 Pneumonia, unspecified organism: Secondary | ICD-10-CM | POA: Diagnosis not present

## 2022-08-17 LAB — COMPREHENSIVE METABOLIC PANEL
ALT: 113 U/L — ABNORMAL HIGH (ref 0–44)
AST: 47 U/L — ABNORMAL HIGH (ref 15–41)
Albumin: 2.4 g/dL — ABNORMAL LOW (ref 3.5–5.0)
Alkaline Phosphatase: 163 U/L — ABNORMAL HIGH (ref 38–126)
Anion gap: 11 (ref 5–15)
BUN: 52 mg/dL — ABNORMAL HIGH (ref 8–23)
CO2: 27 mmol/L (ref 22–32)
Calcium: 8.1 mg/dL — ABNORMAL LOW (ref 8.9–10.3)
Chloride: 94 mmol/L — ABNORMAL LOW (ref 98–111)
Creatinine, Ser: 1.63 mg/dL — ABNORMAL HIGH (ref 0.44–1.00)
GFR, Estimated: 30 mL/min — ABNORMAL LOW (ref >60)
Glucose, Bld: 155 mg/dL — ABNORMAL HIGH (ref 70–99)
Potassium: 3.6 mmol/L (ref 3.5–5.1)
Sodium: 132 mmol/L — ABNORMAL LOW (ref 135–145)
Total Bilirubin: 0.7 mg/dL (ref 0.3–1.2)
Total Protein: 6.3 g/dL — ABNORMAL LOW (ref 6.5–8.1)

## 2022-08-17 LAB — GLUCOSE, CAPILLARY
Glucose-Capillary: 135 mg/dL — ABNORMAL HIGH (ref 70–99)
Glucose-Capillary: 144 mg/dL — ABNORMAL HIGH (ref 70–99)
Glucose-Capillary: 145 mg/dL — ABNORMAL HIGH (ref 70–99)
Glucose-Capillary: 148 mg/dL — ABNORMAL HIGH (ref 70–99)
Glucose-Capillary: 149 mg/dL — ABNORMAL HIGH (ref 70–99)
Glucose-Capillary: 149 mg/dL — ABNORMAL HIGH (ref 70–99)
Glucose-Capillary: 158 mg/dL — ABNORMAL HIGH (ref 70–99)

## 2022-08-17 LAB — CBC
HCT: 31.5 % — ABNORMAL LOW (ref 36.0–46.0)
Hemoglobin: 10.5 g/dL — ABNORMAL LOW (ref 12.0–15.0)
MCH: 28.9 pg (ref 26.0–34.0)
MCHC: 33.3 g/dL (ref 30.0–36.0)
MCV: 86.8 fL (ref 80.0–100.0)
Platelets: 201 10*3/uL (ref 150–400)
RBC: 3.63 MIL/uL — ABNORMAL LOW (ref 3.87–5.11)
RDW: 15.5 % (ref 11.5–15.5)
WBC: 8.3 10*3/uL (ref 4.0–10.5)
nRBC: 0.2 % (ref 0.0–0.2)

## 2022-08-17 LAB — MAGNESIUM: Magnesium: 2.3 mg/dL (ref 1.7–2.4)

## 2022-08-17 LAB — PHOSPHORUS: Phosphorus: 2.4 mg/dL — ABNORMAL LOW (ref 2.5–4.6)

## 2022-08-17 MED ORDER — ORAL CARE MOUTH RINSE
15.0000 mL | OROMUCOSAL | Status: DC
  Administered 2022-08-17 – 2022-08-18 (×4): 15 mL via OROMUCOSAL

## 2022-08-17 MED ORDER — ORAL CARE MOUTH RINSE: 15.0000 mL | OROMUCOSAL | Status: DC | PRN

## 2022-08-17 NOTE — Progress Notes (Signed)
PCCM note  Pt is unresponsive off sedation. CT head ordered.  [name] MD 08/17/2022, 4:57 PM

## 2022-08-17 NOTE — Progress Notes (Signed)
Rounding Note    Patient Name: Patricia [name] Date of Encounter: 08/17/2022  [place] Cardiologist: Dr. Marlou Porch   Subjective   Excellent diuresis overnight- net negative almost 2L. Sodium is improving with diuresis, now up to 132. Creatinine is stable. Liver enzymes are improving.  Lightly sedated on fentanyl - MAP in the 70's on levophed.  Inpatient Medications    Scheduled Meds:  apixaban  5 mg Per Tube BID   atorvastatin  10 mg Per Tube Daily   Chlorhexidine Gluconate Cloth  6 each Topical Daily   docusate  100 mg Per Tube BID   famotidine  20 mg Per Tube Daily   feeding supplement (OSMOLITE 1.5 CAL)  1,000 mL Per Tube Q24H   feeding supplement (PROSource TF20)  60 mL Per Tube Daily   guaiFENesin  15 mL Per Tube Q6H   levothyroxine  88 mcg Per Tube Q0600   mouth rinse  15 mL Mouth Rinse Q2H   oxyCODONE  5 mg Per Tube Q6H   polyethylene glycol  17 g Per Tube Daily   sodium chloride flush  3 mL Intravenous Q12H   Continuous Infusions:  amiodarone 30 mg/hr (08/17/22 0654)   fentaNYL infusion INTRAVENOUS 50 mcg/hr (08/17/22 0654)   furosemide (LASIX) 200 mg in dextrose 5 % 100 mL (2 mg/mL) infusion 10 mg/hr (08/17/22 0654)   norepinephrine (LEVOPHED) Adult infusion 4 mcg/min (08/17/22 0654)   PRN Meds: acetaminophen **OR** acetaminophen, albuterol, bisacodyl, fentaNYL, fentaNYL (SUBLIMAZE) injection, midazolam, ondansetron (ZOFRAN) IV, mouth rinse, sodium phosphate   Vital Signs    Vitals:   08/17/22 0615 08/17/22 0630 08/17/22 0645 08/17/22 0700  BP: 139/87 121/63 (!) 112/57 95/62  Pulse:  79 91 71  Resp: '15 16 17 16  '$ Temp: 98.8 F (37.1 C) 99 F (37.2 C) 99 F (37.2 C) 98.8 F (37.1 C)  TempSrc:      SpO2:  97% 96% 97%  Weight:      Height:        Intake/Output Summary (Last 24 hours) at 08/17/2022 0812 Last data filed at 08/17/2022 0654 Gross per 24 hour  Intake 2151.67 ml  Output 4100 ml  Net -1948.33 ml      08/14/2022    1:59 PM  07/31/2022    2:16 PM 07/12/2022    4:02 PM  Last 3 Weights  Weight (lbs) 157 lb 13.6 oz 156 lb 157 lb  Weight (kg) 71.6 kg 70.761 kg 71.215 kg      Telemetry    Atrial fibrillation, rate controlled, with intermittent PVCs - Personally Reviewed  ECG    No new - Personally Reviewed  Physical Exam   GEN: intubated and sedated, on ventilator. Not responding to stimulation. NECK: JVD 6 cm CARDIAC: irregularly regular rhythm, normal S1 and S2, no rubs or gallops. 2/6 systolic murmur VASCULAR: Radial pulses 2+ bilaterally.  RESPIRATORY:  Diffusely coarse and diminished at bases, ventilated breath sounds ABDOMEN: Soft, non-tender, non-distended MUSCULOSKELETAL:  normal bulk SKIN: Warm and dry, trivial bilateral LE edema NEUROLOGIC/PSYCHIATRIC: intubated and sedated   Labs    High Sensitivity Troponin:   Recent Labs  Lab 08/27/2022 0147 08/26/2022 0408 08/14/22 0520  TROPONINIHS '14 15 17     '$ Chemistry Recent Labs  Lab 08/15/22 0406 08/16/22 0525 08/16/22 1633 08/17/22 0620  NA 131* 127* 131* 132*  K 3.1* 2.9* 3.4* 3.6  CL 97* 93* 96* 94*  CO2 '22 24 26 27  '$ GLUCOSE 124* 267* 142* 155*  BUN 46* 47* 52* 52*  CREATININE 1.86* 1.61* 1.68* 1.63*  CALCIUM 7.9* 7.6* 8.0* 8.1*  MG 1.9 2.4  --  2.3  PROT 6.4* 6.2*  --  6.3*  ALBUMIN 2.4* 2.6*  --  2.4*  AST 73* 42*  --  47*  ALT 243* 154*  --  113*  ALKPHOS 100 124  --  163*  BILITOT 0.5 0.5  --  0.7  GFRNONAA 25* 30* 29* 30*  ANIONGAP '12 10 9 11    '$ Lipids  Recent Labs  Lab 08/13/22 0419  TRIG 63    Hematology Recent Labs  Lab 08/15/22 0406 08/16/22 0525 08/17/22 0620  WBC 7.9 8.1 8.3  RBC 3.61* 3.54* 3.63*  HGB 10.6* 10.3* 10.5*  HCT 32.4* 31.5* 31.5*  MCV 89.8 89.0 86.8  MCH 29.4 29.1 28.9  MCHC 32.7 32.7 33.3  RDW 15.2 15.3 15.5  PLT 192 190 201   Thyroid  Recent Labs  Lab 08/15/22 1544  TSH 3.321    BNP Recent Labs  Lab 08/13/22 0419 08/14/22 0520 08/15/22 0406  BNP 636.2* 512.1* 722.4*     DDimer No results for input(s): "DDIMER" in the last 168 hours.   Radiology    DG Chest Port 1 View  Result Date: 08/16/2022 CLINICAL DATA:  Respiratory failure. EXAM: PORTABLE CHEST 1 VIEW COMPARISON:  One-view abdomen 08/15/2022 FINDINGS: Endotracheal tube is stable. Gastric tube courses off the inferior border the film. Heart is enlarged. Atherosclerotic changes are present at the aortic arch. Diffuse interstitial and airspace disease is similar the prior exam. Left greater than right pleural effusions are stable as well. IMPRESSION: 1. Stable support apparatus. 2. Stable diffuse interstitial and airspace disease and left greater than right pleural effusions. Electronically Signed   By: San Morelle M.D.   On: 08/16/2022 07:13   DG CHEST PORT 1 VIEW  Result Date: 08/15/2022 CLINICAL DATA:  PICC line placement EXAM: PORTABLE CHEST 1 VIEW COMPARISON:  08/14/2022, CT 07/31/2022, chest x-ray 08/13/2022, 08/12/2022 FINDINGS: Endotracheal tube tip about 6.4 cm superior to the carina. Esophageal tube tip below the diaphragm but incompletely visualized. New right upper extremity central venous catheter with tip at the distal SVC. Cardiomegaly with pleural effusions and left greater than right basilar airspace disease. Heterogeneous ground-glass disease in the right upper lobe. Aortic atherosclerosis. IMPRESSION: 1. New right upper extremity central venous catheter with tip at the distal SVC. 2. Cardiomegaly with bilateral pleural effusions and similar appearance of bilateral interstitial and ground-glass opacities with left greater than right basilar consolidations. Electronically Signed   By: Donavan Foil M.D.   On: 08/15/2022 20:16   [place] EKG SITE RITE  Result Date: 08/15/2022 If Site Rite image not attached, placement could not be confirmed due to current cardiac rhythm.   Cardiac Studies   Echo 08/13/22  1. Left ventricular ejection fraction, by estimation, is 30 to 35%. The  left  ventricle has moderately decreased function. The left ventricle  demonstrates global hypokinesis. There is mild left ventricular  hypertrophy of the basal-septal segment. Left  ventricular diastolic function could not be evaluated. There is the  interventricular septum is flattened in diastole ('D' shaped left  ventricle), consistent with right ventricular volume overload.   2. Right ventricular systolic function is moderately reduced. The right  ventricular size is moderately enlarged. There is moderately elevated  pulmonary artery systolic pressure. The estimated right ventricular  systolic pressure is 96.2 mmHg.   3. Left atrial size was moderately dilated.  4. Right atrial size was moderately dilated.   5. The mitral valve is normal in structure. Mild to moderate mitral valve  regurgitation.   6. The tricuspid valve is abnormal. Tricuspid valve regurgitation is  severe.   7. The aortic valve is tricuspid. There is mild thickening of the aortic  valve. Aortic valve regurgitation is not visualized. Aortic valve  sclerosis is present, with no evidence of aortic valve stenosis.   8. The inferior vena cava is dilated in size with <50% respiratory  variability, suggesting right atrial pressure of 15 mmHg.   Comparison(s): A prior study was performed on 01/16/2021. Prior images  reviewed side by side. The left ventricular function is worsened. The  right ventricular systolic function is worse. Tricuspid insufficiency is  now severe.   Patient Profile     88 y.o. female with a hx of prior CVA, hypothyroidism, GERD and known Afib on apixaban who presented with sepsis secondary to pneumonia with course complicated by Afib with RVR for which Cardiology has been consulted.   Assessment & Plan    #Cardiomyopathy, new Acute systolic and diastolic heart failure Right sided heart failure -EF 30-35%, severe TR, elevated right sided pressures -no blood pressure room for GDMT given  pressors -continue lasix drip. Still 8L positive but down from peak (9.1L). No recent weights. She is making good urine but has significant input from multiple IV sources. -Cr stable with diuresis, remains net negative. Would continue current therapies. Her sodium continues to improve. - MAP in 70;s - probably could wean down levophed today to 2.5 - working toward extubation  #Afib with RVR: unknown duration, suspect paroxysmal -suspect driven by respiratory infection -rates variable, but controlled on amiodarone -CHADs-vasc 6 -Continue amiodarone, attempted transition to oral 11/7 but had significant ectopy. Returned to IV amiodarone with improvement -converted to oral DOAC (was on apixaban 5 mg bid) given no plans for thoracentesis -Management of pneumonia per primary team   #Sepsis secondary to Pneumonia #Acute Hypoxic Respiratory Failure #Pleural effusion -intubated due to persistent hypoxia -no plan for thoracentesis at this time -management per primary team -Diuresis as above.   #History of CVA: -anticoagulation as above -continue statin  CRITICAL CARE Patient is critically ill with multiple organ systems affected and requires high complexity decision making. Total critical care time:45  minutes. This time includes gathering of history, evaluation of patient's response to treatment, examination of patient, review of laboratory and imaging studies, and coordination with consultants. Greater than 50% of time spent in direct patient care.   For questions or updates, please contact [place] Please consult [email or web address] for contact info under     Pixie Casino, MD, FACC, [place] Director of the [facility] Diplomate of the American Board of Clinical Lipidology Attending Cardiologist  Direct Dial: [phone]  Fax: [phone]  Website:  [email or web address]  Pixie Casino, MD  08/17/2022, 8:12 AM

## 2022-08-17 NOTE — Progress Notes (Signed)
NAME:  Patricia [name], MRN:  [id number], DOB:  27-Jun-1930, LOS: 8 ADMISSION DATE:  08/13/2022, CONSULTATION DATE:  08/10/22 REFERRING MD:  Dr. Tawanna Solo, [place], CHIEF COMPLAINT:  Respiratory distress   History of Present Illness:  88 yo female presented to [facility] ER with chest pain, dyspnea, back pain, cough with sputum, subjective fever (Tm 100.3 F).  SpO2 in 80's on room air in ER.  CXR showed Rt infrahilar ASD and she was started on ABx for community acquired pneumonia.  She developed increased respiratory distress, hypoxia with increased O2 needs, A fib with RVR and hypotension on 08/10/22.  She was transferred to ICU and PCCM consulted to assist with ICU management.  Pertinent  Medical History  CVA, Hypothyroidism, GERD, A fib on eliquis, Depression, Bladder incontinence, HLD  Significant Hospital Events: Including procedures, antibiotic start and stop dates in addition to other pertinent events   11/03 Admit 11/04 Transfer to ICU, cardiology consulted, started amiodarone gtt 11/6 RVP positive for rhinovirus 11/8 failed SBT low TV, failed transition to PO amio> SVT/ VT back on IV, 675 UOP with diureses> lasix increased, gtt added  11/10 family meeting.  Continue current level of care with plan for one-way extubation after she is optimized.  She is limited code with no CPR.  Interim History / Subjective:   Poor effort on SBT.  Continues to diurese well  Objective   Blood pressure (!) 108/51, pulse (!) 57, temperature 98.8 F (37.1 C), resp. rate 16, height '5\' 7"'$  (1.702 m), weight 71.6 kg, SpO2 94 %.    Vent Mode: PRVC FiO2 (%):  [30 %] 30 % Set Rate:  [16 bmp] 16 bmp Vt Set:  [430 mL] 430 mL PEEP:  [5 cmH20] 5 cmH20 Plateau Pressure:  [17 cmH20-20 cmH20] 17 cmH20   Intake/Output Summary (Last 24 hours) at 08/17/2022 0910 Last data filed at 08/17/2022 0900 Gross per 24 hour  Intake 2165.69 ml  Output 4725 ml  Net -2559.31 ml   Filed Weights   08/27/2022 1359  Weight: 71.6 kg     Examination: Gen:   Elderly, critically ill-appearing HEENT:  EOMI, sclera anicteric Neck:     No masses; no thyromegaly ET tube Lungs:    Clear to auscultation bilaterally; normal respiratory effort CV:         Regular rate and rhythm; no murmurs Abd:      + bowel sounds; soft, non-tender; no palpable masses, no distension Ext:    1+ edema; adequate peripheral perfusion Skin:      Warm and dry; no rash Neuro: Sedated  Labs/imaging reviewed Significant for sodium 132, creatinine 1.63, phosphorus 2.4 Hemoglobin 10.5, platelets 201 No new imaging  Assessment & Plan:   Acute hypoxic respiratory failure from community acquired pneumonia, volume overload, acute HFpEF, HFrEF, and cor pulmonale - s/p intubation 11/5. +Rhino/Entero Virus  Pleural effusions  -S/P Azithromycin/Rocephin, Stopped 11/7 Plan:  Continue mechanical support, pressure support weans as tolerated Continue diuresis Follow cultures Nebs, bronchodilators, bronchial hygiene Intermittent chest x-ray  Mixed Cardiogenic, Septic Shock> initially felt to be sepsis, with contributing new cardiomyopathy, severe TR, PH, and decompensated RV failure - new onset biventricular failure compared to previous echo 4/22> now EF 30-35%, new severe TR, global HK, mod reduced RVSF, PASP 50.8 (previously normal) - SVO2 77.5 on COOX 11/10  Cardiomyopathy (new) RV failure, cor pulmonale, PH, severe TR - daughter reports issues with rate control with Afib have been an issues and recent LE swelling over several weeks,  questioning if new cardiomyopathy is rate related vs septic vs unknown Plan:  Continue Lasix drip with good diuresis Monitor I's/O On amiodarone and Levophed.  Wean as tolerated.  A fib with RVR.  CHADs-vasc 6 Hx of HLD. Plan Cardiology following, appreciate the assistance. Holding Lopressor, continue amiodarone drip changed back to [facility] 11/8 as plan for pleural effusion with diureses.  If renal function continues  to worsen, will likely need to change back to heparin/ adjust dose Continue lipitor.  AKI  Hyponatremia, hypervolemic  Plan Monitor creatinine and electrolytes.  Replete as needed  Constipation  Last BM 11/3  Plan Bowel regimen  Hx of depression. Holding Home Zoloft  Hypothyroidism. TSH 3.321 11/9  Continue Synthroid  Deconditioning. PT/OT when able  Overall> updated her daughter, [name] at the bedside, in whom she lives with.  Prognosis remains guarded given her age, co morbidities, and current conditions as above.  She wants her mother to have good quality of life.  GOC approached, and daughter would not want CPR and I agree that she would likely not recover from CPR at her age and underlying condition.  Will continued otherwise with aggressive care for now, with the exception of no CPR.    Best Practice (right click and "Reselect all SmartList Selections" daily)   Diet/type: Continue TF DVT prophylaxis: DOAC  GI prophylaxis: H2B Lines: Central line.,  PICC in place  Foley:  Yes, and it is still needed Code Status:  full code> limited, no CPR 11/9 Last date of multidisciplinary goals of care discussion [updated pt's family at bedside]   Signature:   The patient is critically ill with multiple organ system failure and requires high complexity decision making for assessment and support, frequent evaluation and titration of therapies, advanced monitoring, review of radiographic studies and interpretation of complex data.   Critical Care Time devoted to patient care services, exclusive of separately billable procedures, described in this note is 45 minutes.   Marshell Garfinkel MD [place] [facility] See Amion for pager  If no response to pager , please call [phone] until 7pm After 7:00 pm call Elink  [phone] 08/17/2022, 9:10 AM

## 2022-08-18 DIAGNOSIS — I5023 Acute on chronic systolic (congestive) heart failure: Secondary | ICD-10-CM | POA: Diagnosis not present

## 2022-08-18 DIAGNOSIS — L8990 Pressure ulcer of unspecified site, unspecified stage: Secondary | ICD-10-CM | POA: Insufficient documentation

## 2022-08-18 DIAGNOSIS — I472 Ventricular tachycardia, unspecified: Secondary | ICD-10-CM

## 2022-08-18 DIAGNOSIS — I4819 Other persistent atrial fibrillation: Secondary | ICD-10-CM | POA: Diagnosis not present

## 2022-08-18 DIAGNOSIS — I469 Cardiac arrest, cause unspecified: Secondary | ICD-10-CM

## 2022-08-18 DIAGNOSIS — J189 Pneumonia, unspecified organism: Secondary | ICD-10-CM | POA: Diagnosis not present

## 2022-08-18 DIAGNOSIS — A419 Sepsis, unspecified organism: Secondary | ICD-10-CM | POA: Diagnosis not present

## 2022-08-18 DIAGNOSIS — I4901 Ventricular fibrillation: Secondary | ICD-10-CM

## 2022-08-18 LAB — CBC
HCT: 35.9 % — ABNORMAL LOW (ref 36.0–46.0)
Hemoglobin: 11.9 g/dL — ABNORMAL LOW (ref 12.0–15.0)
MCH: 28.9 pg (ref 26.0–34.0)
MCHC: 33.1 g/dL (ref 30.0–36.0)
MCV: 87.1 fL (ref 80.0–100.0)
Platelets: 247 10*3/uL (ref 150–400)
RBC: 4.12 MIL/uL (ref 3.87–5.11)
RDW: 15.2 % (ref 11.5–15.5)
WBC: 11.4 10*3/uL — ABNORMAL HIGH (ref 4.0–10.5)
nRBC: 0.2 % (ref 0.0–0.2)

## 2022-08-18 LAB — PHOSPHORUS: Phosphorus: 3.7 mg/dL (ref 2.5–4.6)

## 2022-08-18 LAB — COMPREHENSIVE METABOLIC PANEL
ALT: 96 U/L — ABNORMAL HIGH (ref 0–44)
AST: 58 U/L — ABNORMAL HIGH (ref 15–41)
Albumin: 2.8 g/dL — ABNORMAL LOW (ref 3.5–5.0)
Alkaline Phosphatase: 187 U/L — ABNORMAL HIGH (ref 38–126)
Anion gap: 14 (ref 5–15)
BUN: 63 mg/dL — ABNORMAL HIGH (ref 8–23)
CO2: 30 mmol/L (ref 22–32)
Calcium: 8.7 mg/dL — ABNORMAL LOW (ref 8.9–10.3)
Chloride: 89 mmol/L — ABNORMAL LOW (ref 98–111)
Creatinine, Ser: 1.69 mg/dL — ABNORMAL HIGH (ref 0.44–1.00)
GFR, Estimated: 29 mL/min — ABNORMAL LOW (ref >60)
Glucose, Bld: 169 mg/dL — ABNORMAL HIGH (ref 70–99)
Potassium: 3.5 mmol/L (ref 3.5–5.1)
Sodium: 133 mmol/L — ABNORMAL LOW (ref 135–145)
Total Bilirubin: 0.6 mg/dL (ref 0.3–1.2)
Total Protein: 7.1 g/dL (ref 6.5–8.1)

## 2022-08-18 LAB — TROPONIN I (HIGH SENSITIVITY)
Troponin I (High Sensitivity): 28 ng/L — ABNORMAL HIGH (ref <18)
Troponin I (High Sensitivity): 30 ng/L — ABNORMAL HIGH (ref <18)

## 2022-08-18 LAB — GLUCOSE, CAPILLARY
Glucose-Capillary: 126 mg/dL — ABNORMAL HIGH (ref 70–99)
Glucose-Capillary: 130 mg/dL — ABNORMAL HIGH (ref 70–99)

## 2022-08-18 LAB — MAGNESIUM: Magnesium: 2.2 mg/dL (ref 1.7–2.4)

## 2022-08-18 MED ORDER — ACETAMINOPHEN 325 MG PO TABS: 650.0000 mg | ORAL_TABLET | Freq: Four times a day (QID) | ORAL | Status: DC | PRN

## 2022-08-18 MED ORDER — GLYCOPYRROLATE 1 MG PO TABS: 1.0000 mg | ORAL_TABLET | ORAL | Status: DC | PRN

## 2022-08-18 MED ORDER — SODIUM CHLORIDE 0.9 % IV SOLN: INTRAVENOUS | Status: DC

## 2022-08-18 MED ORDER — POLYVINYL ALCOHOL 1.4 % OP SOLN: 1.0000 [drp] | Freq: Four times a day (QID) | OPHTHALMIC | Status: DC | PRN

## 2022-08-18 MED ORDER — GLYCOPYRROLATE 0.2 MG/ML IJ SOLN: 0.2000 mg | INTRAMUSCULAR | Status: DC | PRN

## 2022-08-18 MED ORDER — ACETAMINOPHEN 650 MG RE SUPP: 650.0000 mg | Freq: Four times a day (QID) | RECTAL | Status: DC | PRN

## 2022-08-18 MED ORDER — MAGNESIUM SULFATE IN D5W 1-5 GM/100ML-% IV SOLN
1.0000 g | Freq: Once | INTRAVENOUS | Status: AC
  Administered 2022-08-18: 1 g via INTRAVENOUS
  Filled 2022-08-18: qty 100

## 2022-08-18 MED ORDER — MORPHINE 100MG IN NS 100ML (1MG/ML) PREMIX INFUSION
0.0000 mg/h | INTRAVENOUS | Status: DC
  Administered 2022-08-18: 5 mg/h via INTRAVENOUS

## 2022-08-18 MED ORDER — MORPHINE BOLUS VIA INFUSION
5.0000 mg | INTRAVENOUS | Status: DC | PRN
  Administered 2022-08-18 (×2): 5 mg via INTRAVENOUS

## 2022-08-18 MED ORDER — GLYCOPYRROLATE 0.2 MG/ML IJ SOLN
0.2000 mg | INTRAMUSCULAR | Status: DC | PRN
  Administered 2022-08-18: 0.2 mg via INTRAVENOUS
  Filled 2022-08-18: qty 1

## 2022-08-19 LAB — CULTURE, RESPIRATORY W GRAM STAIN

## 2022-08-27 DIAGNOSIS — M8088XD Other osteoporosis with current pathological fracture, vertebra(e), subsequent encounter for fracture with routine healing: Secondary | ICD-10-CM | POA: Diagnosis not present

## 2022-08-27 DIAGNOSIS — E785 Hyperlipidemia, unspecified: Secondary | ICD-10-CM | POA: Diagnosis not present

## 2022-08-27 DIAGNOSIS — I2690 Septic pulmonary embolism without acute cor pulmonale: Secondary | ICD-10-CM | POA: Diagnosis not present

## 2022-08-27 DIAGNOSIS — J9601 Acute respiratory failure with hypoxia: Secondary | ICD-10-CM | POA: Diagnosis not present

## 2022-09-05 IMAGING — CR DG CHEST 2V
2 series · 2 of 2 positions shown · non-contrast
Comparison: Chest x-ray dated October 11, 2020.

CLINICAL DATA: Near-syncope.

EXAM:
CHEST - 2 VIEW

[w chest lat]
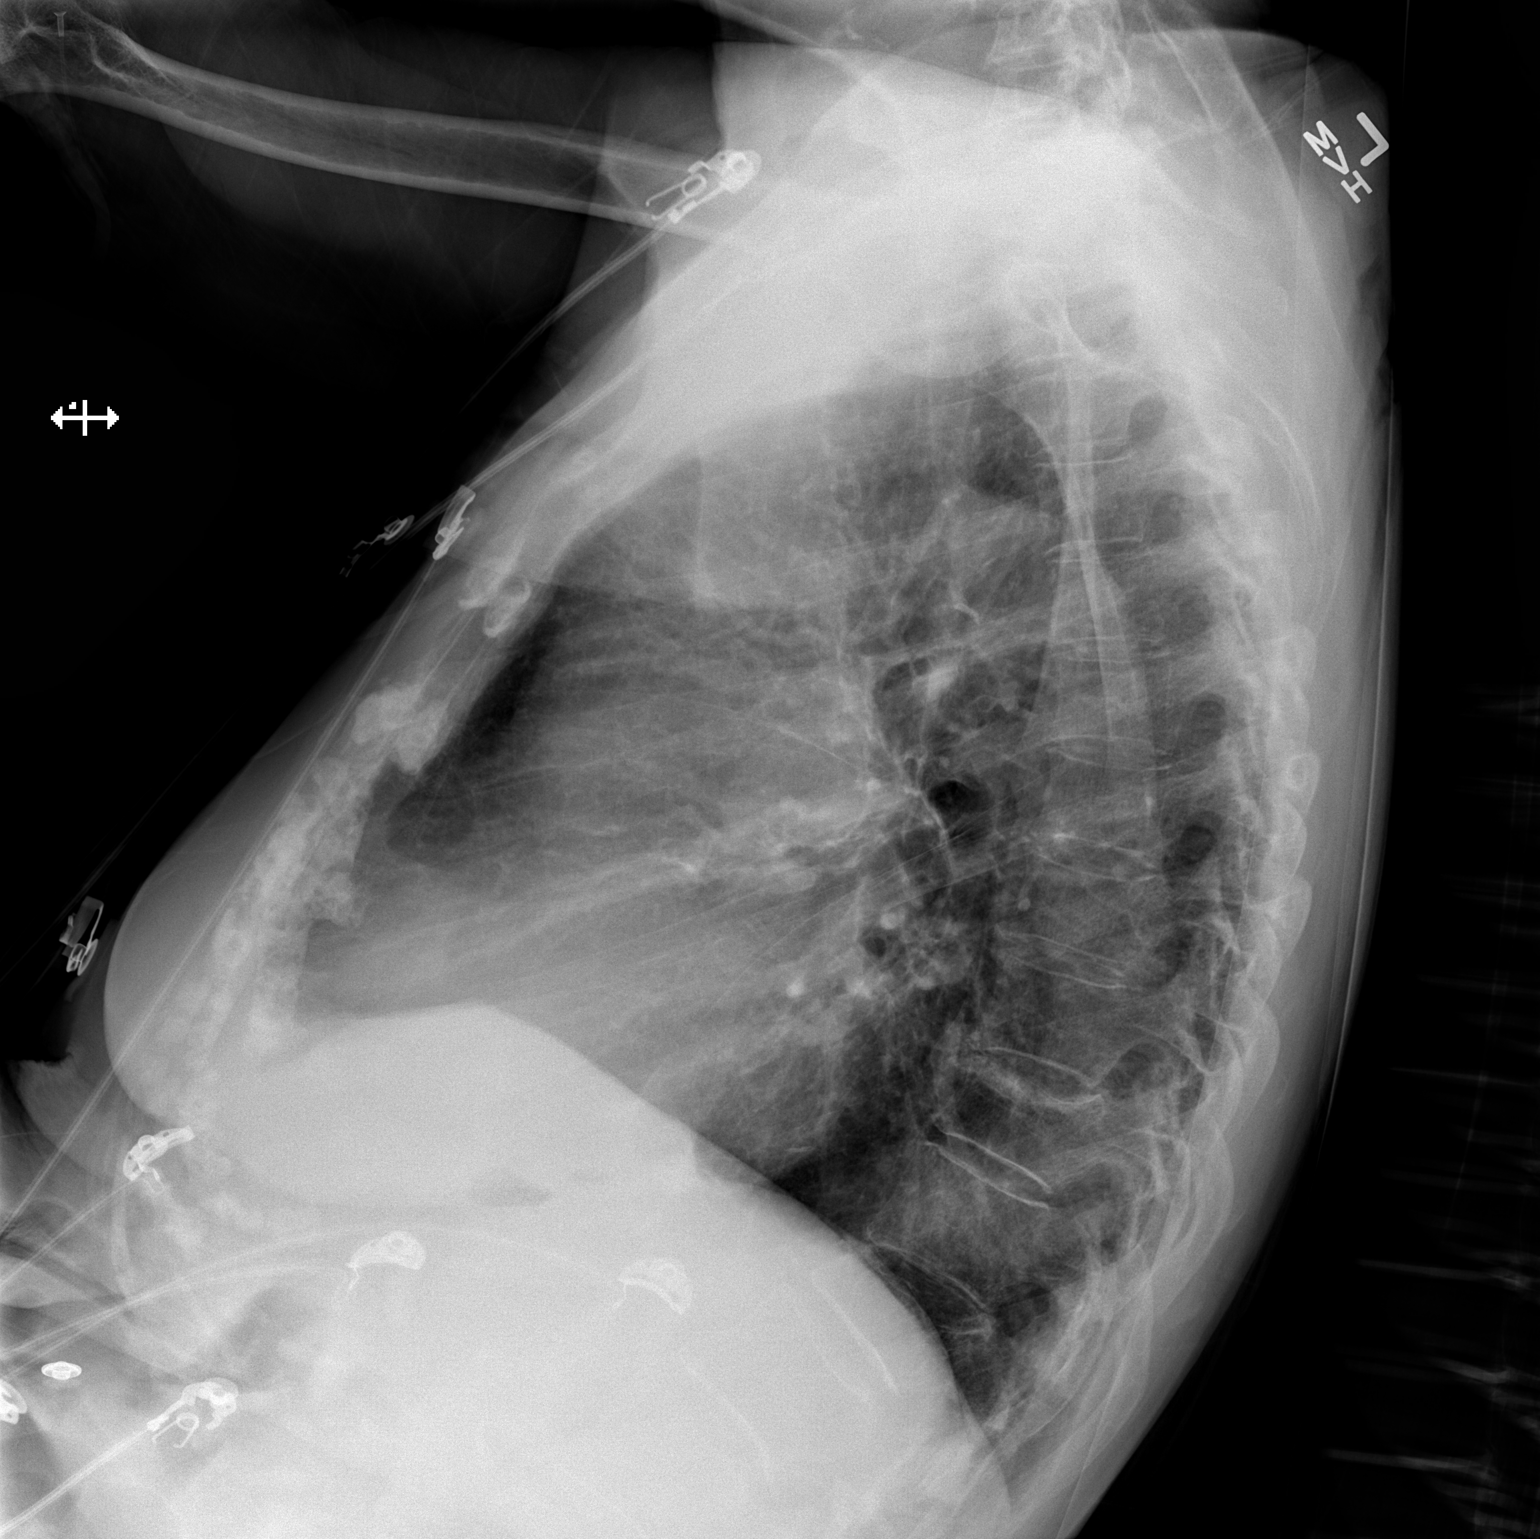

[x chest ap]
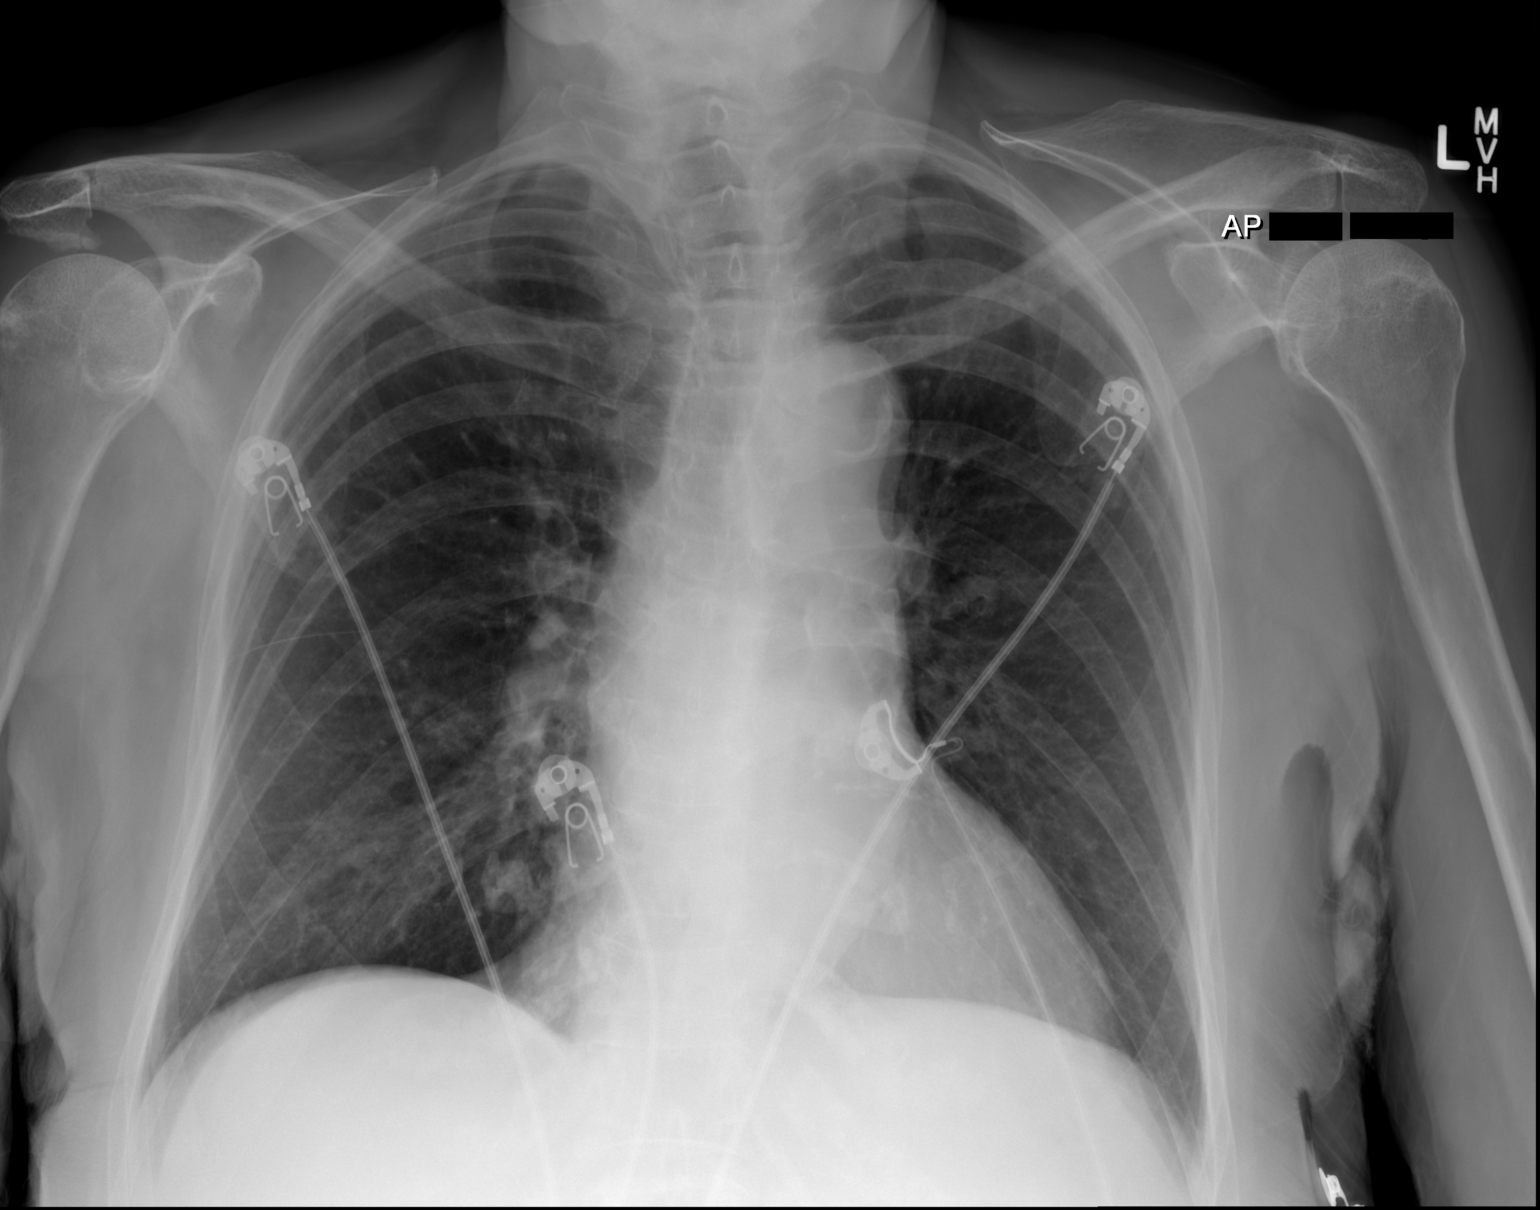

[2 of 2 positions shown; findings below may reference images not displayed]

FINDINGS: The heart size and mediastinal contours are within normal limits.
Both lungs are clear. Chronic lower thoracic compression deformities
again noted.
IMPRESSION: 1. No acute cardiopulmonary disease.

## 2022-09-06 NOTE — Death Summary Note (Signed)
  DEATH SUMMARY   Patient Details  Name: Patricia [name] MRN: [id number] DOB: 1930-07-24  Admission/Discharge Information   Admit Date:  Aug 10, 2022  Date of Death: Date of Death: 2022/08/19  Time of Death: Time of Death: 1401/12/18  Length of Stay: 12/15/22  Referring Physician: Deland Pretty, MD   Reason(s) for Hospitalization   Acute respiratory failure Community-acquired pneumonia  Diagnoses  Preliminary cause of death:  Acute respiratory failure Sepsis present on admission Community acquired pneumonia present on admission Cardiogenic shock present on admission Septic shock present on admission Cor pulmonale, pulmonary hypertension, severe tricuspid regurgitation with RV failure, atrial fibrillation with RVR Acute kidney injury Pressure injury, sacral ulcer.  Present on admission. Malnutrition DNR, comfort measures  Secondary Diagnoses (including complications and co-morbidities):  Principal Problem:   Sepsis due to pneumonia [facility]) Active Problems:   Occipital stroke ([place])   Hypothyroid   Atrial fibrillation with RVR ([place])   Hypokalemia   Acute respiratory failure with hypoxia ([facility])   CAP (community acquired pneumonia)   Hypotension   Hypervolemia   Malnutrition of moderate degree   Longstanding persistent atrial fibrillation ([facility])   Pressure injury of skin   Brief Hospital Course (including significant findings, care, treatment, and services provided and events leading to death)  Patricia [name] is a 88 yo female presented to [facility] ER with chest pain, dyspnea, back pain, cough with sputum, subjective fever (Tm 100.3 F).  SpO2 in 80's on room air in ER.  CXR showed Rt infrahilar ASD and she was started on ABx for community acquired pneumonia.  She developed increased respiratory distress, hypoxia with increased O2 needs, A fib with RVR and hypotension on 08/10/22.  She was transferred to ICU and PCCM consulted to assist with ICU management..  Her respiratory RAST panel was positive for  rhinovirus/enterovirus  She was also found to be in heart failure secondary to combined systolic, diastolic heart failure with cor pulmonale, pulmonary hypertension, severe tricuspid regurgitation with RV failure, atrial fibrillation with RVR.  Treated with antibiotics, cardiology was consulted for management of heart failure.  She remains on the ventilator on amiodarone, Lasix drip for diuresis.  Multiple family meetings were held and she was transition to DNR with plans on one-way extubation after an adequate trial of diuresis.  Her mental status also remained poor with poor responsiveness off sedation.  CT head was negative for acute abnormality.  On 20-Aug-2023 she went into the tach/V-fib and underwent defibrillation by the code team.  Subsequently the family felt that she had been through a lot and did not wish for her to be supported any longer.  They requested transition to comfort measures with terminal extubation on 08-20-2023.  Signature:   Marshell Garfinkel MD [place] [facility] 08/20/2022, 11:01 AM

## 2022-09-06 NOTE — Procedures (Signed)
Extubation Procedure Note  Patient Details:   Name: Patricia [name] DOB: Aug 14, 1930 MRN: [id number]   Airway Documentation:    Vent end date: Aug 31, 2022 Vent end time: 1339   Evaluation  O2 sats: stable throughout Complications: No apparent complications Patient did tolerate procedure well. Bilateral Breath Sounds: Diminished, Clear   No  Johnette Abraham 31-Aug-2022, 1:40 PM

## 2022-09-06 NOTE — Progress Notes (Signed)
NAME:  Patricia [name], MRN:  [id number], DOB:  1930/07/25, LOS: 9 ADMISSION DATE:  08/20/2022, CONSULTATION DATE:  08/10/22 REFERRING MD:  Dr. Tawanna Solo, [facility], CHIEF COMPLAINT:  Respiratory distress   History of Present Illness:  88 yo female presented to [facility] ER with chest pain, dyspnea, back pain, cough with sputum, subjective fever (Tm 100.3 F).  SpO2 in 80's on room air in ER.  CXR showed Rt infrahilar ASD and she was started on ABx for community acquired pneumonia.  She developed increased respiratory distress, hypoxia with increased O2 needs, A fib with RVR and hypotension on 08/10/22.  She was transferred to ICU and PCCM consulted to assist with ICU management.  Pertinent  Medical History  CVA, Hypothyroidism, GERD, A fib on eliquis, Depression, Bladder incontinence, HLD  Significant Hospital Events: Including procedures, antibiotic start and stop dates in addition to other pertinent events   11/03 Admit 11/04 Transfer to ICU, cardiology consulted, started amiodarone gtt 11/6 RVP positive for rhinovirus 11/8 failed SBT low TV, failed transition to PO amio> SVT/ VT back on IV, 675 UOP with diureses> lasix increased, gtt added  11/10 family meeting.  Continue current level of care with plan for one-way extubation after she is optimized.  She is limited code with no CPR.  Interim History / Subjective:   Has remained unresponsive off sedation.  CT head yesterday with no acute abnormality She had an episode of V. tach/V-fib last night and underwent defibrillation. Family is upset that she had the defibrillation. They want to transition to comfort measures today Remains on the ventilator, amiodarone drip.  Objective   Blood pressure 94/60, pulse 81, temperature 98.4 F (36.9 C), resp. rate 16, height '5\' 7"'$  (1.702 m), weight 71.6 kg, SpO2 (!) 60 %.    Vent Mode: PRVC FiO2 (%):  [30 %-100 %] 30 % Set Rate:  [16 bmp] 16 bmp Vt Set:  [430 mL] 430 mL PEEP:  [5 cmH20] 5 cmH20 Plateau  Pressure:  [16 cmH20-21 cmH20] 21 cmH20   Intake/Output Summary (Last 24 hours) at September 14, 2022 0813 Last data filed at 09/14/22 0711 Gross per 24 hour  Intake 1965.52 ml  Output 4623 ml  Net -2657.48 ml   Filed Weights   08/29/2022 1359  Weight: 71.6 kg    Examination: Blood pressure 94/60, pulse 81, temperature 98.4 F (36.9 C), resp. rate 16, height '5\' 7"'$  (1.702 m), weight 71.6 kg, SpO2 (!) 60 %. Gen:      No acute distress, frail, elderly HEENT:  EOMI, sclera anicteric Neck:     No masses; no thyromegaly Lungs:    Clear to auscultation bilaterally; normal respiratory effort CV:         Regular rate and rhythm; no murmurs Abd:      + bowel sounds; soft, non-tender; no palpable masses, no distension Ext:    No edema; adequate peripheral perfusion Skin:      Warm and dry; no rash Neuro: alert and oriented x 3 Psych: normal mood and affect   Labs/imaging reviewed Significant for sodium 133, BUN/creatinine 63/1.69, potassium 3.5, magnesium 2.2, Phos 3.7, troponin 28 WBC 11.4, hemoglobin 11.9, platelets 247 CT head with no acute intracranial process  Assessment & Plan:   Acute hypoxic respiratory failure from community acquired pneumonia, volume overload, acute HFpEF, HFrEF, and cor pulmonale - s/p intubation 11/5. +Rhino/Entero Virus  Pleural effusions  -S/P Azithromycin/Rocephin, Stopped 11/7 Plan:  Continue mechanical support Continue diuresis Follow cultures Nebs, bronchodilators, bronchial hygiene Intermittent chest  x-ray  Mixed Cardiogenic, Septic Shock> initially felt to be sepsis, with contributing new cardiomyopathy, severe TR, PH, and decompensated RV failure - new onset biventricular failure compared to previous echo 4/22> now EF 30-35%, new severe TR, global HK, mod reduced RVSF, PASP 50.8 (previously normal) - SVO2 77.5 on COOX 11/10  Cardiomyopathy (new) RV failure, cor pulmonale, PH, severe TR - daughter reports issues with rate control with Afib have  been an issues and recent LE swelling over several weeks, questioning if new cardiomyopathy is rate related vs septic vs unknown Plan:  Continue Lasix drip Monitor I's/O On amiodarone.  Increase dose as she is still having PVCs  A fib with RVR.  CHADs-vasc 6 Hx of HLD. Plan Cardiology following, appreciate the assistance. Holding Lopressor, continue amiodarone drip changed back to [facility] 11/8 as plan for pleural effusion with diureses.  If renal function continues to worsen, will likely need to change back to heparin/ adjust dose Continue lipitor.  AKI  Hyponatremia, hypervolemic  Plan Monitor creatinine and electrolytes.  Replete as needed  Constipation  Last BM 11/3  Plan Bowel regimen  Hx of depression. Holding Home Zoloft  Hypothyroidism. TSH 3.321 11/9  Continue Synthroid  Deconditioning. PT/OT when able  Goals of care Made full DNR today.  Family feels that she is suffering and has poor prognosis for functional recovery and would not like to be supported like this The alcohol and the rest of the family and plan on transitioning to comfort measures and withdrawal of care later today.  Best Practice (right click and "Reselect all SmartList Selections" daily)   Diet/type: Continue TF DVT prophylaxis: DOAC  GI prophylaxis: H2B Lines: Central line.,  PICC in place  Foley:  Yes, and it is still needed Code Status:  DNR Last date of multidisciplinary goals of care discussion [updated pt's family at bedside]   Signature:   The patient is critically ill with multiple organ system failure and requires high complexity decision making for assessment and support, frequent evaluation and titration of therapies, advanced monitoring, review of radiographic studies and interpretation of complex data.   Critical Care Time devoted to patient care services, exclusive of separately billable procedures, described in this note is 45 minutes.   Marshell Garfinkel MD [facility] See Amion for pager  If no response to pager , please call [phone] until 7pm After 7:00 pm call Elink  [phone] August 25, 2022, 8:13 AM

## 2022-09-06 NOTE — Progress Notes (Signed)
Family sent home with all valuables belonging to the patient. Patient placement card has been provided, family will notify them of funeral home decision tomorrow. Body preparation for morgue complete. All lines and devices removed, all adhesive padding removed. Will notify patient placement once body taken to morgue.

## 2022-09-06 NOTE — Progress Notes (Signed)
Daughter (HCPOA) at bedside, daughter updated about cardiac arrest event and ROSC.  Daughter does say if she goes into cardiac arrest again, do not shock her, just make her comfortable.  Will update partial code status to include no shocks.  Sent message to Dr. Elsworth Soho to this effect.

## 2022-09-06 NOTE — ED Provider Notes (Signed)
Called to bedside from the emergency department due to [place].  Patient reportedly had been in ventricular tachycardia with progression into torsades and received 1 shock prior to my arrival.  Patient already intubated and is a partial code with no compressions.  I met primary internal medicine provider at bedside who will direct her care.  Airway already secure.   Teressa Lower, MD 2022-08-26 [phone]

## 2022-09-06 NOTE — Progress Notes (Signed)
VF/VT arrest - limited code , no CPR Shock x 1 with return to Afib , already on amio Give 1 g Mg & check lytes Cardiology following  Leanna Sato. Elsworth Soho MD

## 2022-09-06 NOTE — Progress Notes (Addendum)
[facility] Hospitalist note: Responded to Code Blue. This is a 88 yo F with CAP + rhino/entero virus, respiratory failure requiring intubation, volume overload, HFrEF, mixed cardiogenic and septic shock on vasopressors, and cor pulmonale. Patient was already a partial code, though this was ordered as to do everything other than chest compressions.  Initial arrest rhythm VTach (with pulse) -> Torsades -> VFib (no pulse).  Chest compressions NOT started given patients code status.  One (1) defibrillation shock administered and ROSC achieved.  Pt already on amiodarone gtt for a.fib.  Additional amiodarone bolus not felt needed per Dr. Elsworth Soho.  Lab work ordered including trop, BMP, and Magnesium.  Though electrolytes look fairly normal:  Mag 2.2  CMP     Component Value Date/Time   NA 133 (L) 09-01-2022 0429   NA 141 04/21/2017 1054   K 3.5 09-01-22 0429   K 4.3 04/21/2017 1054   CL 89 (L) 09-01-22 0429   CO2 30 September 01, 2022 0429   CO2 25 04/21/2017 1054   GLUCOSE 169 (H) 2022/09/01 0429   GLUCOSE 103 04/21/2017 1054   BUN 63 (H) Sep 01, 2022 0429   BUN 20.6 04/21/2017 1054   CREATININE 1.69 (H) 2022/09/01 0429   CREATININE 0.9 04/21/2017 1054   CALCIUM 8.7 (L) 09-01-2022 0429   CALCIUM 9.5 04/21/2017 1054   PROT 7.1 09-01-22 0429   PROT 7.5 04/21/2017 1054   PROT 6.9 04/21/2017 1054   ALBUMIN 2.8 (L) 09-01-2022 0429   ALBUMIN 3.8 04/21/2017 1054   AST 58 (H) 2022/09/01 0429   AST 20 04/21/2017 1054   ALT 96 (H) 2022/09/01 0429   ALT 17 04/21/2017 1054   ALKPHOS 187 (H) Sep 01, 2022 0429   ALKPHOS 52 04/21/2017 1054   BILITOT 0.6 09-01-22 0429   BILITOT 0.45 04/21/2017 1054   GFRNONAA 29 (L) September 01, 2022 0429   GFRAA >60 04/18/2020 1747   EKG performed: does show some minimal ST segment depressions in lateral leads, but no obvious STEMI.  And we dont think that cardiology would be too excited about taking this 88 year old for LHC even if it did and even if she were stable enough  to attempt transport right now (and family wouldn't want that anyhow).  Troponin x2 Q2H ordered and pending.  Cardiology is already following patient in consult.  Im going to defer to them if they want to repeat 2d echo following todays cardiac arrest event.  Surprisingly pt seems slightly more responsive now that she had been all day prior to cardiac arrest, eye opening to voice, some purposeful movement though not following commands.  Family in and at bedside.  Spent time updating them as to what happened and where patient was at now.  Per daughter [facility]): no shocks if she has another cardiac arrest like this again, instead "just keep her comfortable".  Son at bedside is in agreement with this.  I told them I fully supported this decision.  Updated Dr. Elsworth Soho, RN staff, and code status in computer system to reflect family's decision.  CRITICAL CARE Performed by: Etta Quill.   Total critical care time: 70 minutes  Critical care time was exclusive of separately billable procedures and treating other patients.  Critical care was necessary to treat or prevent imminent or life-threatening deterioration.  Critical care was time spent personally by me on the following activities: development of treatment plan with patient and/or surrogate as well as nursing, discussions with consultants, evaluation of patient's response to treatment, examination of patient, obtaining history  from patient or surrogate, ordering and performing treatments and interventions, ordering and review of laboratory studies, ordering and review of radiographic studies, pulse oximetry and re-evaluation of patient's condition.

## 2022-09-06 NOTE — Progress Notes (Signed)
Rounding Note    Patient Name: Patricia [name] Date of Encounter: 08-20-22  [place] Cardiologist: Dr. Marlou Porch   Subjective   Events noted this morning - including VT arrest, appears to be R on T monomorphic VT that degenerated into polymorphic VT/torsades- she was listed as partial code, no CPR - did receive a shock which converted her back to afib. On amiodarone. Last 12 lead EKG on 11/8 showed normal QTc. EKG today shows afib at 90, QTc 445 msec. Potassium was 3.5-3.6, magnesium was 2.2.   Inpatient Medications    Scheduled Meds:  apixaban  5 mg Per Tube BID   atorvastatin  10 mg Per Tube Daily   Chlorhexidine Gluconate Cloth  6 each Topical Daily   docusate  100 mg Per Tube BID   famotidine  20 mg Per Tube Daily   feeding supplement (OSMOLITE 1.5 CAL)  1,000 mL Per Tube Q24H   feeding supplement (PROSource TF20)  60 mL Per Tube Daily   guaiFENesin  15 mL Per Tube Q6H   levothyroxine  88 mcg Per Tube Q0600   mouth rinse  15 mL Mouth Rinse Q2H   oxyCODONE  5 mg Per Tube Q6H   polyethylene glycol  17 g Per Tube Daily   sodium chloride flush  3 mL Intravenous Q12H   Continuous Infusions:  amiodarone 30 mg/hr (08-20-2022 0711)   fentaNYL infusion INTRAVENOUS 25 mcg/hr (08/20/2022 0711)   furosemide (LASIX) 200 mg in dextrose 5 % 100 mL (2 mg/mL) infusion 10 mg/hr (August 20, 2022 0711)   norepinephrine (LEVOPHED) Adult infusion 4 mcg/min (Aug 20, 2022 0711)   PRN Meds: acetaminophen **OR** acetaminophen, albuterol, bisacodyl, fentaNYL, fentaNYL (SUBLIMAZE) injection, midazolam, ondansetron (ZOFRAN) IV, mouth rinse, sodium phosphate   Vital Signs    Vitals:   08/20/22 0645 08-20-2022 0700 Aug 20, 2022 0800 08/20/2022 0804  BP: 99/65 94/60 (!) 100/55   Pulse: 80 81 71   Resp: '16 16 16   '$ Temp: 98.4 F (36.9 C) 98.4 F (36.9 C) 98.4 F (36.9 C)   TempSrc:      SpO2: 100% 100% 99% (!) 60%  Weight:      Height:        Intake/Output Summary (Last 24 hours) at 08-20-2022  0820 Last data filed at 08/20/2022 0711 Gross per 24 hour  Intake 1965.52 ml  Output 4623 ml  Net -2657.48 ml      08/24/2022    1:59 PM 07/31/2022    2:16 PM 07/12/2022    4:02 PM  Last 3 Weights  Weight (lbs) 157 lb 13.6 oz 156 lb 157 lb  Weight (kg) 71.6 kg 70.761 kg 71.215 kg      Telemetry    Atrial fibrillation, rate controlled, with intermittent PVCs - Personally Reviewed  ECG    No new - Personally Reviewed  Physical Exam   GEN: intubated and sedated, on ventilator. Not responding to stimulation. NECK: JVD 6 cm CARDIAC: irregularly regular rhythm, normal S1 and S2, no rubs or gallops. 2/6 systolic murmur VASCULAR: Radial pulses 2+ bilaterally.  RESPIRATORY:  Diffusely coarse and diminished at bases, ventilated breath sounds ABDOMEN: Soft, non-tender, non-distended MUSCULOSKELETAL:  normal bulk SKIN: Warm and dry, trivial bilateral LE edema NEUROLOGIC/PSYCHIATRIC: intubated and sedated   Labs    High Sensitivity Troponin:   Recent Labs  Lab 08/25/2022 0147 08/26/2022 0408 08/14/22 0520 Aug 20, 2022 0429  TROPONINIHS '14 15 17 '$ 28*     Chemistry Recent Labs  Lab 08/16/22 0525 08/16/22 1633 08/17/22 4403  08/20/22 0429  NA 127* 131* 132* 133*  K 2.9* 3.4* 3.6 3.5  CL 93* 96* 94* 89*  CO2 '24 26 27 30  '$ GLUCOSE 267* 142* 155* 169*  BUN 47* 52* 52* 63*  CREATININE 1.61* 1.68* 1.63* 1.69*  CALCIUM 7.6* 8.0* 8.1* 8.7*  MG 2.4  --  2.3 2.2  PROT 6.2*  --  6.3* 7.1  ALBUMIN 2.6*  --  2.4* 2.8*  AST 42*  --  47* 58*  ALT 154*  --  113* 96*  ALKPHOS 124  --  163* 187*  BILITOT 0.5  --  0.7 0.6  GFRNONAA 30* 29* 30* 29*  ANIONGAP '10 9 11 14    '$ Lipids  Recent Labs  Lab 08/13/22 0419  TRIG 63    Hematology Recent Labs  Lab 08/16/22 0525 08/17/22 0620 2022-08-20 0429  WBC 8.1 8.3 11.4*  RBC 3.54* 3.63* 4.12  HGB 10.3* 10.5* 11.9*  HCT 31.5* 31.5* 35.9*  MCV 89.0 86.8 87.1  MCH 29.1 28.9 28.9  MCHC 32.7 33.3 33.1  RDW 15.3 15.5 15.2  PLT 190 201 247    Thyroid  Recent Labs  Lab 08/15/22 1544  TSH 3.321    BNP Recent Labs  Lab 08/13/22 0419 08/14/22 0520 08/15/22 0406  BNP 636.2* 512.1* 722.4*    DDimer No results for input(s): "DDIMER" in the last 168 hours.   Radiology    CT HEAD WO CONTRAST (5MM)  Result Date: 08/17/2022 CLINICAL DATA:  Mental status change, unknown cause EXAM: CT HEAD WITHOUT CONTRAST TECHNIQUE: Contiguous axial images were obtained from the base of the skull through the vertex without intravenous contrast. RADIATION DOSE REDUCTION: This exam was performed according to the departmental dose-optimization program which includes automated exposure control, adjustment of the mA and/or kV according to patient size and/or use of iterative reconstruction technique. COMPARISON:  06/29/2022 FINDINGS: Brain: Diffuse parenchymal atrophy. Patchy areas of hypoattenuation in deep and periventricular white matter bilaterally. Negative for acute intracranial hemorrhage, mass lesion, acute infarction, midline shift, or mass-effect. Acute infarct may be inapparent on noncontrast CT. Ventricles and sulci symmetric. Vascular: Atherosclerotic and physiologic intracranial calcifications. Skull: Normal. Negative for fracture or focal lesion. Sinuses/Orbits: Fluid level in the right sphenoid sinus. Otherwise negative. Other: None IMPRESSION: 1. Negative for bleed or other acute intracranial process. 2. Atrophy and nonspecific white matter changes. Electronically Signed   By: Lucrezia Europe M.D.   On: 08/17/2022 18:59    Cardiac Studies   Echo 08/13/22  1. Left ventricular ejection fraction, by estimation, is 30 to 35%. The  left ventricle has moderately decreased function. The left ventricle  demonstrates global hypokinesis. There is mild left ventricular  hypertrophy of the basal-septal segment. Left  ventricular diastolic function could not be evaluated. There is the  interventricular septum is flattened in diastole ('D' shaped left   ventricle), consistent with right ventricular volume overload.   2. Right ventricular systolic function is moderately reduced. The right  ventricular size is moderately enlarged. There is moderately elevated  pulmonary artery systolic pressure. The estimated right ventricular  systolic pressure is 94.8 mmHg.   3. Left atrial size was moderately dilated.   4. Right atrial size was moderately dilated.   5. The mitral valve is normal in structure. Mild to moderate mitral valve  regurgitation.   6. The tricuspid valve is abnormal. Tricuspid valve regurgitation is  severe.   7. The aortic valve is tricuspid. There is mild thickening of the aortic  valve.  Aortic valve regurgitation is not visualized. Aortic valve  sclerosis is present, with no evidence of aortic valve stenosis.   8. The inferior vena cava is dilated in size with <50% respiratory  variability, suggesting right atrial pressure of 15 mmHg.   Comparison(s): A prior study was performed on 01/16/2021. Prior images  reviewed side by side. The left ventricular function is worsened. The  right ventricular systolic function is worse. Tricuspid insufficiency is  now severe.   Patient Profile     88 y.o. female with a hx of prior CVA, hypothyroidism, GERD and known Afib on apixaban who presented with sepsis secondary to pneumonia with course complicated by Afib with RVR for which Cardiology has been consulted.   Assessment & Plan    #Ventricular tachycardia - had classic R on T phenomenon secondary to PVC's which lead to sustained monomorphic VT that degenerated into VF (polymorphic VT/torsades) - shocked once back to afib. Has had more PVC's this morning.  High risk for recurrent VT - family wants comfort measures, no further shocks. - Recommend increase amiodarone to 60 mg/hr to try and reduce ectopy until family has time for closure and starting comfort measures.  #Cardiomyopathy, new Acute systolic and diastolic heart  failure Right sided heart failure -EF 30-35%, severe TR, elevated right sided pressures -no blood pressure room for GDMT given pressors -continue lasix drip. Still 8L positive but down from peak (9.1L). No recent weights. She is making good urine but has significant input from multiple IV sources. -Cr stable with diuresis, remains net negative.  -Plans now to pursue comfort measures  #Afib with RVR: unknown duration, suspect paroxysmal -suspect driven by respiratory infection -rates variable, but controlled on amiodarone -CHADs-vasc 6 -Continue amiodarone, attempted transition to oral 11/7 but had significant ectopy. Returned to IV amiodarone with improvement -converted to oral DOAC (was on apixaban 5 mg bid) given no plans for thoracentesis -Management of pneumonia per primary team   #Sepsis secondary to Pneumonia #Acute Hypoxic Respiratory Failure #Pleural effusion -intubated due to persistent hypoxia -no plan for thoracentesis at this time -management per primary team -Diuresis as above.   #History of CVA: -anticoagulation as above -continue statin  CRITICAL CARE Patient is critically ill with multiple organ systems affected and requires high complexity decision making. Total critical care time:45  minutes. This time includes gathering of history, evaluation of patient's response to treatment, examination of patient, review of laboratory and imaging studies, and coordination with consultants. Greater than 50% of time spent in direct patient care.   For questions or updates, please contact [place] Please consult [email or web address] for contact info under     Pixie Casino, MD, FACC, [place] Director of the [facility] Diplomate of the American Board of Clinical Lipidology Attending Cardiologist  Direct Dial: [phone]  Fax: [phone]  Website:   [email or web address]  Pixie Casino, MD  2022/08/22, 8:20 AM

## 2022-09-06 NOTE — Progress Notes (Signed)
Called to provide support for family of patient. They were very welcoming and appreciative of all the staff support. They shared loving stories of the patient and requested prayer. I maintained presence until patient passed away. Family was so loving and supportive of one another.

## 2022-09-06 DEATH — deceased

## 2022-09-12 ENCOUNTER — Ambulatory Visit: Payer: Medicare PPO | Admitting: Cardiology
